# Patient Record
Sex: Female | Born: 2000 | Race: Black or African American | Hispanic: No | Marital: Single | Smoking: Never smoker
Health system: Southern US, Community
[De-identification: ages and names within clinical notes are randomized; demographics above are authoritative.]

## PROBLEM LIST (undated history)

## (undated) DIAGNOSIS — N809 Endometriosis, unspecified: Secondary | ICD-10-CM

## (undated) DIAGNOSIS — N289 Disorder of kidney and ureter, unspecified: Secondary | ICD-10-CM

## (undated) DIAGNOSIS — J45909 Unspecified asthma, uncomplicated: Secondary | ICD-10-CM

## (undated) DIAGNOSIS — F32A Depression, unspecified: Secondary | ICD-10-CM

## (undated) DIAGNOSIS — F419 Anxiety disorder, unspecified: Secondary | ICD-10-CM

## (undated) HISTORY — PX: MOUTH SURGERY: SHX715

## (undated) HISTORY — PX: ABDOMINAL SURGERY: SHX537

---
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
## 2018-04-15 ENCOUNTER — Encounter (HOSPITAL_COMMUNITY): Payer: Self-pay

## 2018-04-15 ENCOUNTER — Ambulatory Visit (HOSPITAL_COMMUNITY)
Admission: EM | Admit: 2018-04-15 | Discharge: 2018-04-15 | Disposition: A | Discharge status: Home / Self Care | Payer: Medicaid Other | Attending: Family Medicine | Admitting: Family Medicine

## 2018-04-15 DIAGNOSIS — Z8742 Personal history of other diseases of the female genital tract: Secondary | ICD-10-CM | POA: Diagnosis not present

## 2018-04-15 DIAGNOSIS — R590 Localized enlarged lymph nodes: Secondary | ICD-10-CM | POA: Diagnosis present

## 2018-04-15 DIAGNOSIS — Z886 Allergy status to analgesic agent status: Secondary | ICD-10-CM | POA: Diagnosis not present

## 2018-04-15 DIAGNOSIS — Z79899 Other long term (current) drug therapy: Secondary | ICD-10-CM | POA: Diagnosis not present

## 2018-04-15 DIAGNOSIS — J45909 Unspecified asthma, uncomplicated: Secondary | ICD-10-CM | POA: Insufficient documentation

## 2018-04-15 DIAGNOSIS — Z8249 Family history of ischemic heart disease and other diseases of the circulatory system: Secondary | ICD-10-CM | POA: Diagnosis not present

## 2018-04-15 DIAGNOSIS — Z7951 Long term (current) use of inhaled steroids: Secondary | ICD-10-CM | POA: Diagnosis not present

## 2018-04-15 DIAGNOSIS — R591 Generalized enlarged lymph nodes: Secondary | ICD-10-CM

## 2018-04-15 DIAGNOSIS — Z833 Family history of diabetes mellitus: Secondary | ICD-10-CM | POA: Insufficient documentation

## 2018-04-15 LAB — CBC WITH DIFFERENTIAL/PLATELET
ABS IMMATURE GRANULOCYTES: 0 10*3/uL (ref 0.0–0.1)
Basophils Absolute: 0 10*3/uL (ref 0.0–0.1)
Basophils Relative: 0 %
EOS PCT: 1 %
Eosinophils Absolute: 0 10*3/uL (ref 0.0–0.7)
HCT: 40.1 % (ref 36.0–46.0)
HEMOGLOBIN: 13 g/dL (ref 12.0–15.0)
Immature Granulocytes: 0 %
Lymphocytes Relative: 43 %
Lymphs Abs: 1.5 10*3/uL (ref 0.7–4.0)
MCH: 25.8 pg — AB (ref 26.0–34.0)
MCHC: 32.4 g/dL (ref 30.0–36.0)
MCV: 79.7 fL (ref 78.0–100.0)
MONO ABS: 0.4 10*3/uL (ref 0.1–1.0)
MONOS PCT: 12 %
NEUTROS ABS: 1.5 10*3/uL — AB (ref 1.7–7.7)
Neutrophils Relative %: 44 %
PLATELETS: 243 10*3/uL (ref 150–400)
RBC: 5.03 MIL/uL (ref 3.87–5.11)
RDW: 13 % (ref 11.5–15.5)
WBC: 3.5 10*3/uL — ABNORMAL LOW (ref 4.0–10.5)

## 2018-04-15 NOTE — Discharge Instructions (Signed)
Rest and drink fluids Blood work drawn.  We will follow up with you regarding abnormal results Please follow up with your PCP next week for further evaluation and management if symptoms persists Return or go to the ER if you have any new or worsening symptoms

## 2018-04-15 NOTE — ED Triage Notes (Signed)
Pt states that she has some swelling around her neck and is unsure of origin.

## 2018-04-15 NOTE — ED Provider Notes (Signed)
[facility]   [id number] 04/15/18 Arrival Time: 1103  SUBJECTIVE: History from: patient. Hannah Rileylexis Eltringham is a 17 y.o. female complains of improving swollen lymph node that began 3 weeks ago.  Denies a precipitating event or specific injury.  Denies positive sick exposure, but states she recently had a URI 2 weeks ago.  Denies cat scratch, consuming undercooked meat, tick bite, recent travel, recent blood transfusion, high-risk sexual behavior, or IV drug use.  Localizes her symptoms to the right back side of neck.  Describes the pain as intermittent and sharp and throbbing in character.  Has tried hydrocodone for the pain.  Denies alleviating and aggravating factors.  Denies similar symptoms in the past.  Denies fever, chills, rhinorrhea, ear pain, congestion, sore throat, SOB, CP, abdominal pain, erythema, ecchymosis, weakness, numbness and tingling.      ROS: As per HPI.  Past Medical History:  Diagnosis Date  . Asthma   . Endometriosis    Past Surgical History:  Procedure Laterality Date  . ABDOMINAL SURGERY    . MOUTH SURGERY     Allergies  Allergen Reactions  . Naproxen Itching and Other (See Comments)    Heaving menstrual bleeding   No current facility-administered medications on file prior to encounter.    Current Outpatient Medications on File Prior to Encounter  Medication Sig Dispense Refill  . albuterol (PROVENTIL HFA;VENTOLIN HFA) 108 (90 Base) MCG/ACT inhaler Inhale 2 puffs into the lungs every 4 (four) hours as needed for wheezing or shortness of breath. 1 Inhaler 2  . beclomethasone (QVAR) 40 MCG/ACT inhaler Inhale 1 puff into the lungs 2 (two) times daily.    . cetirizine (ZYRTEC) 10 MG tablet Take 10 mg by mouth daily.    . fluticasone (FLONASE) 50 MCG/ACT nasal spray Place 2 sprays into both nostrils daily as needed for rhinitis.    . medroxyPROGESTERone (DEPO-PROVERA) 150 MG/ML injection Inject 150 mg into the muscle every 3 (three) months.    .  montelukast (SINGULAIR) 10 MG tablet Take 10 mg by mouth at bedtime.    . naproxen (NAPROSYN) 500 MG tablet Take 1 tablet (500 mg total) by mouth 2 (two) times daily. 30 tablet 0  . nitrofurantoin, macrocrystal-monohydrate, (MACROBID) 100 MG capsule Take 1 capsule (100 mg total) by mouth 2 (two) times daily. (Patient not taking: Reported on 07/13/2016) 10 capsule 0  . Olopatadine HCl (PAZEO) 0.7 % SOLN Place 1 drop into both eyes daily.    . ondansetron (ZOFRAN ODT) 4 MG disintegrating tablet 4mg  ODT q4 hours prn nausea/vomit 4 tablet 0  . predniSONE (DELTASONE) 20 MG tablet 2 tabs po daily x 4 days 8 tablet 0  . sodium chloride (OCEAN) 0.65 % SOLN nasal spray Place 1 spray into both nostrils as needed for congestion.     Social History   Socioeconomic History  . Marital status: Single    Spouse name: Not on file  . Number of children: Not on file  . Years of education: Not on file  . Highest education level: Not on file  Occupational History  . Not on file  Social Needs  . Financial resource strain: Not on file  . Food insecurity:    Worry: Not on file    Inability: Not on file  . Transportation needs:    Medical: Not on file    Non-medical: Not on file  Tobacco Use  . Smoking status: Passive Smoke Exposure - Never Smoker  . Smokeless tobacco: Never Used  Substance and Sexual Activity  . Alcohol use: No  . Drug use: Not on file  . Sexual activity: Never  Lifestyle  . Physical activity:    Days per week: Not on file    Minutes per session: Not on file  . Stress: Not on file  Relationships  . Social connections:    Talks on phone: Not on file    Gets together: Not on file    Attends religious service: Not on file    Active member of club or organization: Not on file    Attends meetings of clubs or organizations: Not on file    Relationship status: Not on file  . Intimate partner violence:    Fear of current or ex partner: Not on file    Emotionally abused: Not on file     Physically abused: Not on file    Forced sexual activity: Not on file  Other Topics Concern  . Not on file  Social History Narrative  . Not on file   Family History  Problem Relation Age of Onset  . Heart disease Mother   . Diabetes Father     OBJECTIVE:  Vitals:   04/15/18 1153 04/15/18 1154  BP: 112/67   Resp:  19  Temp: 98.4 F (36.9 C)   TempSrc: Oral   SpO2:  100%    General appearance: AOx3; in no acute distress.  Head: NCAT Neck: Single moveable enlarged lymph node right side submandibular aspect <1cm without tenderness; second enlarged lymph node approximately 2 cm moveable right side posterior cervical chain with mild tenderness  Lungs: CTA bilaterally Heart: RRR.  Clear S1 and S2 without murmur, gallops, or rubs.  Radial pulses 2+ bilaterally. Skin: warm and dry Neurologic: Ambulates without difficulty; Sensation intact about the upper/ lower extremities Psychological: alert and cooperative; normal mood and affect  Results for orders placed or performed during the hospital encounter of 04/15/18 (from the past 24 hour(s))  CBC with Differential     Status: Abnormal   Collection Time: 04/15/18 12:48 PM  Result Value Ref Range   WBC 3.5 (L) 4.0 - 10.5 K/uL   RBC 5.03 3.87 - 5.11 MIL/uL   Hemoglobin 13.0 12.0 - 15.0 g/dL   HCT 16.1 09.6 - 04.5 %   MCV 79.7 78.0 - 100.0 fL   MCH 25.8 (L) 26.0 - 34.0 pg   MCHC 32.4 30.0 - 36.0 g/dL   RDW 40.9 81.1 - 91.4 %   Platelets 243 150 - 400 K/uL   Neutrophils Relative % 44 %   Neutro Abs 1.5 (L) 1.7 - 7.7 K/uL   Lymphocytes Relative 43 %   Lymphs Abs 1.5 0.7 - 4.0 K/uL   Monocytes Relative 12 %   Monocytes Absolute 0.4 0.1 - 1.0 K/uL   Eosinophils Relative 1 %   Eosinophils Absolute 0.0 0.0 - 0.7 K/uL   Basophils Relative 0 %   Basophils Absolute 0.0 0.0 - 0.1 K/uL   Immature Granulocytes 0 %   Abs Immature Granulocytes 0.0 0.0 - 0.1 K/uL    ASSESSMENT & PLAN:  1. Lymphadenopathy of head and neck    No  orders of the defined types were placed in this encounter.   Rest and drink fluids Blood work drawn.  We will follow up with you regarding abnormal results Please follow up with your PCP next week for further evaluation and management if symptoms persists Return or go to the ER if you have any new or  worsening symptoms  Discussed CBC results with Dr. Tracie Harrier and stated leukopenia most likely due to viral illness.  Patient advised to follow up with PCP if symptoms persists.    Reviewed expectations re: course of current medical issues. Questions answered. Outlined signs and symptoms indicating need for more acute intervention. Patient verbalized understanding. After Visit Summary given.    Rennis Harding, PA-C 04/15/18 2029

## 2018-05-14 ENCOUNTER — Encounter (HOSPITAL_COMMUNITY): Payer: Self-pay

## 2018-05-14 ENCOUNTER — Ambulatory Visit (HOSPITAL_COMMUNITY)
Admission: EM | Admit: 2018-05-14 | Discharge: 2018-05-14 | Disposition: A | Discharge status: Home / Self Care | Payer: Medicaid Other | Attending: Family Medicine | Admitting: Family Medicine

## 2018-05-14 DIAGNOSIS — M546 Pain in thoracic spine: Secondary | ICD-10-CM

## 2018-05-14 DIAGNOSIS — B360 Pityriasis versicolor: Secondary | ICD-10-CM

## 2018-05-14 DIAGNOSIS — G8929 Other chronic pain: Secondary | ICD-10-CM

## 2018-05-14 DIAGNOSIS — M545 Low back pain: Secondary | ICD-10-CM

## 2018-05-14 MED ORDER — BACLOFEN 5 MG PO TABS: 5.0000 mg | ORAL_TABLET | Freq: Three times a day (TID) | ORAL | 0 refills | Status: DC

## 2018-05-14 MED ORDER — KETOCONAZOLE 2 % EX CREA: 1.0000 "application " | TOPICAL_CREAM | Freq: Two times a day (BID) | CUTANEOUS | 0 refills | Status: DC

## 2018-05-14 MED ORDER — TRAMADOL HCL 50 MG PO TABS: 50.0000 mg | ORAL_TABLET | Freq: Four times a day (QID) | ORAL | 0 refills | Status: AC | PRN

## 2018-05-14 NOTE — ED Notes (Signed)
Pt reports being on 2 muscle relaxer's unknown name.
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
## 2018-05-14 NOTE — Discharge Instructions (Addendum)
It was nice seeing you. I am sorry about your back pain. Please see PCP soon for PT referral and also to obtain MRI of your spine. If symptoms worsens, please go to the ED. I also prescribed cream for your facial rash.

## 2018-05-17 ENCOUNTER — Other Ambulatory Visit: Payer: Self-pay

## 2018-05-17 ENCOUNTER — Encounter (HOSPITAL_COMMUNITY): Payer: Self-pay | Admitting: Emergency Medicine

## 2018-05-17 ENCOUNTER — Ambulatory Visit (HOSPITAL_COMMUNITY)
Admission: EM | Admit: 2018-05-17 | Discharge: 2018-05-17 | Disposition: A | Discharge status: Home / Self Care | Payer: Medicaid Other | Attending: Family Medicine | Admitting: Family Medicine

## 2018-05-17 DIAGNOSIS — M545 Low back pain, unspecified: Secondary | ICD-10-CM

## 2018-05-17 DIAGNOSIS — G8929 Other chronic pain: Secondary | ICD-10-CM

## 2018-05-17 MED ORDER — METHYLPREDNISOLONE 4 MG PO TBPK: ORAL_TABLET | ORAL | 0 refills | Status: DC

## 2018-05-17 NOTE — ED Triage Notes (Signed)
Patient was seen for back pain on Saturday, left work early today.  Pain in lower back, shoots into right arm and left leg.  Patient complains of a headache

## 2018-05-17 NOTE — Discharge Instructions (Signed)
Light and regular activity as tolerated.  Complete steroid dose pack.  Please follow up with your primary care provider for recheck and continued management of your symptoms.  If need further work restrictions please follow with occupational health or your PCP.
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
## 2018-05-25 ENCOUNTER — Encounter (HOSPITAL_COMMUNITY): Payer: Self-pay | Admitting: Emergency Medicine

## 2018-05-25 ENCOUNTER — Emergency Department (HOSPITAL_COMMUNITY): Payer: Medicaid Other

## 2018-05-25 ENCOUNTER — Other Ambulatory Visit: Payer: Self-pay

## 2018-05-25 ENCOUNTER — Emergency Department (HOSPITAL_COMMUNITY)
Admission: EM | Admit: 2018-05-25 | Discharge: 2018-05-25 | Disposition: A | Discharge status: Home / Self Care | Payer: Medicaid Other | Attending: Emergency Medicine | Admitting: Emergency Medicine

## 2018-05-25 DIAGNOSIS — J45909 Unspecified asthma, uncomplicated: Secondary | ICD-10-CM | POA: Diagnosis not present

## 2018-05-25 DIAGNOSIS — R079 Chest pain, unspecified: Secondary | ICD-10-CM | POA: Insufficient documentation

## 2018-05-25 DIAGNOSIS — Z79899 Other long term (current) drug therapy: Secondary | ICD-10-CM | POA: Insufficient documentation

## 2018-05-25 DIAGNOSIS — Z7722 Contact with and (suspected) exposure to environmental tobacco smoke (acute) (chronic): Secondary | ICD-10-CM | POA: Insufficient documentation

## 2018-05-25 LAB — I-STAT TROPONIN, ED
TROPONIN I, POC: 0 ng/mL (ref 0.00–0.08)
Troponin i, poc: 0 ng/mL (ref 0.00–0.08)

## 2018-05-25 LAB — BASIC METABOLIC PANEL
ANION GAP: 7 (ref 5–15)
BUN: 5 mg/dL — ABNORMAL LOW (ref 6–20)
CALCIUM: 8.8 mg/dL — AB (ref 8.9–10.3)
CO2: 24 mmol/L (ref 22–32)
CREATININE: 0.7 mg/dL (ref 0.44–1.00)
Chloride: 109 mmol/L (ref 98–111)
GFR calc Af Amer: >60 mL/min (ref >60)
GLUCOSE: 92 mg/dL (ref 70–99)
Potassium: 3.6 mmol/L (ref 3.5–5.1)
Sodium: 140 mmol/L (ref 135–145)

## 2018-05-25 LAB — CBC
HCT: 39 % (ref 36.0–46.0)
HEMOGLOBIN: 12.6 g/dL (ref 12.0–15.0)
MCH: 25.8 pg — ABNORMAL LOW (ref 26.0–34.0)
MCHC: 32.3 g/dL (ref 30.0–36.0)
MCV: 79.8 fL (ref 78.0–100.0)
PLATELETS: 262 10*3/uL (ref 150–400)
RBC: 4.89 MIL/uL (ref 3.87–5.11)
RDW: 12.7 % (ref 11.5–15.5)
WBC: 3.2 10*3/uL — ABNORMAL LOW (ref 4.0–10.5)

## 2018-05-25 LAB — I-STAT BETA HCG BLOOD, ED (MC, WL, AP ONLY): I-stat hCG, quantitative: <5 m[IU]/mL (ref <5)

## 2018-05-25 LAB — D-DIMER, QUANTITATIVE: D-Dimer, Quant: 0.57 ug/mL-FEU — ABNORMAL HIGH (ref 0.00–0.50)

## 2018-05-25 MED ORDER — FENTANYL CITRATE (PF) 100 MCG/2ML IJ SOLN
50.0000 ug | Freq: Once | INTRAMUSCULAR | Status: AC
  Administered 2018-05-25: 50 ug via INTRAVENOUS
  Filled 2018-05-25: qty 2

## 2018-05-25 MED ORDER — ALBUTEROL SULFATE (2.5 MG/3ML) 0.083% IN NEBU
2.5000 mg | INHALATION_SOLUTION | Freq: Once | RESPIRATORY_TRACT | Status: AC
  Administered 2018-05-25: 2.5 mg via RESPIRATORY_TRACT
  Filled 2018-05-25: qty 3

## 2018-05-25 MED ORDER — ALBUTEROL SULFATE HFA 108 (90 BASE) MCG/ACT IN AERS
1.0000 | INHALATION_SPRAY | Freq: Once | RESPIRATORY_TRACT | Status: AC
  Administered 2018-05-25: 1 via RESPIRATORY_TRACT
  Filled 2018-05-25: qty 6.7

## 2018-05-25 MED ORDER — ALBUTEROL SULFATE HFA 108 (90 BASE) MCG/ACT IN AERS: 2.0000 | INHALATION_SPRAY | RESPIRATORY_TRACT | 2 refills | Status: DC | PRN

## 2018-05-25 MED ORDER — BECLOMETHASONE DIPROPIONATE 40 MCG/ACT IN AERS: 1.0000 | INHALATION_SPRAY | Freq: Two times a day (BID) | RESPIRATORY_TRACT | 0 refills | Status: DC

## 2018-05-25 MED ORDER — METHYLPREDNISOLONE SODIUM SUCC 125 MG IJ SOLR
125.0000 mg | Freq: Once | INTRAMUSCULAR | Status: AC
  Administered 2018-05-25: 125 mg via INTRAVENOUS
  Filled 2018-05-25: qty 2

## 2018-05-25 MED ORDER — METHOCARBAMOL 500 MG PO TABS: 500.0000 mg | ORAL_TABLET | Freq: Two times a day (BID) | ORAL | 0 refills | Status: DC

## 2018-05-25 MED ORDER — IOPAMIDOL (ISOVUE-370) INJECTION 76%
INTRAVENOUS | Status: AC
  Administered 2018-05-25: 75 mL
  Filled 2018-05-25: qty 100
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
## 2018-05-25 NOTE — Progress Notes (Signed)
Patient ordered neb treatment, unable to give at this time. Patient not in room. Will check back.

## 2018-05-25 NOTE — Discharge Instructions (Addendum)
You can take Tylenol or Ibuprofen as directed for pain. You can alternate Tylenol and Ibuprofen every 4 hours. If you take Tylenol at 1pm, then you can take Ibuprofen at 5pm. Then you can take Tylenol again at 9pm.   Take Robaxin as prescribed. This medication will make you drowsy so do not drive or drink alcohol when taking it.  Follow-up with your primary care doctor as directed.  Follow-up with the Cardiologist office as directed.   Return to the Emergency Department immediately if you experiencing worsening chest pain, difficulty breathing, nausea/vomiting, get very sweaty, headache or any other worsening or concerning symptoms.

## 2018-06-28 ENCOUNTER — Encounter (HOSPITAL_COMMUNITY): Payer: Self-pay | Admitting: Emergency Medicine

## 2018-06-28 ENCOUNTER — Ambulatory Visit (INDEPENDENT_AMBULATORY_CARE_PROVIDER_SITE_OTHER): Payer: Medicaid Other

## 2018-06-28 ENCOUNTER — Ambulatory Visit (HOSPITAL_COMMUNITY)
Admission: EM | Admit: 2018-06-28 | Discharge: 2018-06-28 | Disposition: A | Discharge status: Home / Self Care | Payer: Medicaid Other | Attending: Family Medicine | Admitting: Family Medicine

## 2018-06-28 ENCOUNTER — Other Ambulatory Visit: Payer: Self-pay

## 2018-06-28 DIAGNOSIS — M25562 Pain in left knee: Secondary | ICD-10-CM | POA: Diagnosis not present

## 2018-06-28 DIAGNOSIS — L989 Disorder of the skin and subcutaneous tissue, unspecified: Secondary | ICD-10-CM

## 2018-06-28 DIAGNOSIS — M546 Pain in thoracic spine: Secondary | ICD-10-CM

## 2018-06-28 DIAGNOSIS — M4120 Other idiopathic scoliosis, site unspecified: Secondary | ICD-10-CM

## 2018-06-28 DIAGNOSIS — G8929 Other chronic pain: Secondary | ICD-10-CM | POA: Diagnosis not present

## 2018-06-28 DIAGNOSIS — N926 Irregular menstruation, unspecified: Secondary | ICD-10-CM

## 2018-06-28 LAB — POCT PREGNANCY, URINE: PREG TEST UR: NEGATIVE
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
## 2018-06-28 NOTE — Discharge Instructions (Signed)
It was nice seeing you today. For your irregular menses, pregnancy test is neg. This could be due to hormone mismatch. Please see PCP soon for reassessment.  For the bump on your belly, this could be due to insect bite. Use Tylenol as needed for pain. See us soon if it worsen.  For knee pain, likely due to sprain. Xray is neg. Use Ibuprofen or Tylenol as needed.  For your mid back pain, you have mild scoliosis on xray which could be the cause of your pain. Please start using back brace and see PCP soon for orthopedic referral.  Tylenol or ibuprofen as needed for pain.
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
## 2018-07-13 DIAGNOSIS — M4124 Other idiopathic scoliosis, thoracic region: Secondary | ICD-10-CM | POA: Insufficient documentation

## 2018-07-19 ENCOUNTER — Encounter: Payer: Self-pay | Admitting: *Deleted

## 2018-07-19 ENCOUNTER — Encounter: Payer: Self-pay | Admitting: Internal Medicine

## 2018-07-19 ENCOUNTER — Ambulatory Visit (INDEPENDENT_AMBULATORY_CARE_PROVIDER_SITE_OTHER): Payer: Medicaid Other | Admitting: Internal Medicine

## 2018-07-19 VITALS — BP 112/62 | HR 70 | Ht 64.0 in | Wt 124.2 lb

## 2018-07-19 DIAGNOSIS — I951 Orthostatic hypotension: Secondary | ICD-10-CM | POA: Diagnosis not present

## 2018-07-19 NOTE — Patient Instructions (Addendum)
Your physician recommends that you continue on your current medications as directed. Please refer to the Current Medication list given to you today.  Your physician recommends that you return for lab work and urine today (cortisol, tsh, urinalysis)   Push fluids and salt every day.

## 2018-07-19 NOTE — Progress Notes (Signed)
Cardiology Office Note   Date:  07/19/2018   ID:  Hannah Mccarthy, DOB 02/21/2001, MRN [id number]  PCP:  [facility]  Cardiologist:   Dietrich Pates, MD   Pt is referred by [facility] for cjest [aom   History of Present Illness: Hannah Mccarthy is a 17 y.o. female with a history of fatigue for years   Rare dizziness  No syncope    Has had some sharp chest pains, not associated with activity   R and L     She was seen in medicine clinic at [facility] on [facility]/10  Compalined of chronic back pain Some sharp componitens   The patient is not that active   Sleeps a lot    Knows she has to drink more fluids       Current Meds  Medication Sig  . albuterol (PROVENTIL HFA;VENTOLIN HFA) 108 (90 Base) MCG/ACT inhaler Inhale 2 puffs into the lungs every 4 (four) hours as needed for wheezing or shortness of breath.  . baclofen 5 MG TABS Take 5 mg by mouth 3 (three) times daily.  . beclomethasone (QVAR) 40 MCG/ACT inhaler Inhale 1 puff into the lungs 2 (two) times daily.  . cetirizine (ZYRTEC) 10 MG tablet Take 10 mg by mouth daily.  . fexofenadine (ALLEGRA) 180 MG tablet Take 180 mg by mouth daily.  Marland Kitchen ketoconazole (NIZORAL) 2 % cream Apply 1 application topically 2 (two) times daily.  . medroxyPROGESTERone (DEPO-PROVERA) 150 MG/ML injection Inject 150 mg into the muscle every 3 (three) months.  . meloxicam (MOBIC) 15 MG tablet Take 7.5-15 mg by mouth daily as needed for pain.  . methocarbamol (ROBAXIN) 500 MG tablet Take 1 tablet (500 mg total) by mouth 2 (two) times daily.  . methylPREDNISolone (MEDROL DOSEPAK) 4 MG TBPK tablet Per box instructions  . montelukast (SINGULAIR) 10 MG tablet Take 10 mg by mouth at bedtime.  Marland Kitchen QUEtiapine (SEROQUEL) 25 MG tablet Take 25 mg by mouth daily as needed for sedation.  . traMADol (ULTRAM) 50 MG tablet Take 50 mg by mouth every 6 (six) hours as needed for pain.  Marland Kitchen tretinoin (RETIN-A) 0.025 % cream Apply 1 application topically every evening.  .  triamcinolone (KENALOG) 0.1 % paste Use as directed 1 application in the mouth or throat as directed. Apply three to five times daily to affected area     Allergies:   Escitalopram and Naproxen   Past Medical History:  Diagnosis Date  . Asthma   . Endometriosis     Past Surgical History:  Procedure Laterality Date  . ABDOMINAL SURGERY    . MOUTH SURGERY       Social History:  The patient  reports that she is a non-smoker but has been exposed to tobacco smoke. She has never used smokeless tobacco. She reports that she does not drink alcohol.   Family History:  The patient's family history includes Diabetes in her father; Heart disease in her mother.    ROS:  Please see the history of present illness. All other systems are reviewed and  Negative to the above problem except as noted.    PHYSICAL EXAM: VS:  BP 112/62   Pulse 70   Ht 5\' 4"  (1.626 m)   Wt 124 lb 3.2 oz (56.3 kg)   BMI 21.32 kg/m    Orthostatic  LAYING:  BP 107/73  P 71   SITTING:  100/67  P 85   Standing 2 min 98/64  P 97  4 min 98/66  P 98 GEN: Thin 17 yo , in no acute distress  HEENT: normal  Neck: no JVD, carotid bruits, or masses Cardiac: RRR; no murmurs, rubs, or gallops,no edema  Respiratory:  clear to auscultation bilaterally, normal work of breathing GI: soft, nontender, nondistended, + BS  No hepatomegaly  MS: no deformity Moving all extremities   Skin: warm and dry, no rash Neuro:  Strength and sensation are intact Psych: euthymic mood, full affect   EKG:  EKG is ordered today.  SR 70 bpm      Lipid Panel No results found for: CHOL, TRIG, HDL, CHOLHDL, VLDL, LDLCALC, LDLDIRECT    Wt Readings from Last 3 Encounters:  07/19/18 124 lb 3.2 oz (56.3 kg) (43 %, Z= -0.18)*  05/25/18 125 lb (56.7 kg) (45 %, Z= -0.13)*  09/14/17 126 lb (57.2 kg) (50 %, Z= 0.00)*   * Growth percentiles are based on CDC (Girls, 2-20 Years) data.      ASSESSMENT AND PLAN: 1  Chest pain  I think the most  likely cause for CP is musculoskel for intercostal ischemia / strain    Pt's BP is marginal, pulse picks up and BP drops with standing   Can lead to lactic acidosis of interostal muscles Recomm fluids and salt intake   Will check coritisol, TSH,  UA  2   Fatigue   As noted above   Probably related to marginal BP       Current medicines are reviewed at length with the patient today.  The patient does not have concerns regarding medicines.  Signed, Dietrich PatesPaula Jp Eastham, MD  07/19/2018 2:31 PM    [facility] [address], [place], [place]  [address] Phone: [phone]; Fax: [phone]

## 2018-07-20 LAB — URINALYSIS
Bilirubin, UA: NEGATIVE
Glucose, UA: NEGATIVE
KETONES UA: NEGATIVE
LEUKOCYTES UA: NEGATIVE
NITRITE UA: NEGATIVE
Specific Gravity, UA: 1.022 (ref 1.005–1.030)
UUROB: 0.2 mg/dL (ref 0.2–1.0)
pH, UA: 5.5 (ref 5.0–7.5)

## 2018-07-20 LAB — TSH: TSH: 1.58 u[IU]/mL (ref 0.450–4.500)

## 2018-07-20 LAB — CORTISOL: Cortisol: 4.9 ug/dL

## 2018-07-26 ENCOUNTER — Telehealth: Payer: Self-pay | Admitting: *Deleted

## 2018-07-26 DIAGNOSIS — I951 Orthostatic hypotension: Secondary | ICD-10-CM

## 2018-07-26 NOTE — Telephone Encounter (Signed)
-----  Message from Fay Records, MD sent at 07/22/2018 11:48 PM EDT ----- Urine is concentrated   Also protein in it and RBC ? If early UTI WOuld recom she come in for repeat UA with microscopic eval  Set up for CBC, ANA and ESR as well

## 2018-07-26 NOTE — Telephone Encounter (Signed)
Spoke with patient. Reviewed labs. She will come for repeat UA (w microscopic eval) and labs tomorrow. Orders placed.

## 2018-07-27 ENCOUNTER — Encounter: Payer: Self-pay | Admitting: Internal Medicine

## 2018-07-27 ENCOUNTER — Other Ambulatory Visit: Payer: Medicaid Other

## 2018-07-27 DIAGNOSIS — I951 Orthostatic hypotension: Secondary | ICD-10-CM

## 2018-07-27 LAB — URINALYSIS, ROUTINE W REFLEX MICROSCOPIC
Bilirubin, UA: NEGATIVE
Glucose, UA: NEGATIVE
Ketones, UA: NEGATIVE
Nitrite, UA: NEGATIVE
Specific Gravity, UA: 1.017 (ref 1.005–1.030)
Urobilinogen, Ur: 0.2 mg/dL (ref 0.2–1.0)
pH, UA: 6 (ref 5.0–7.5)

## 2018-07-27 LAB — MICROSCOPIC EXAMINATION: Casts: NONE SEEN /lpf

## 2018-07-28 ENCOUNTER — Other Ambulatory Visit: Payer: Self-pay | Admitting: *Deleted

## 2018-07-28 LAB — ANA: Anti Nuclear Antibody(ANA): NEGATIVE

## 2018-07-28 LAB — CBC
HEMATOCRIT: 39.5 % (ref 34.0–46.6)
Hemoglobin: 12.7 g/dL (ref 11.1–15.9)
MCH: 25.3 pg — AB (ref 26.6–33.0)
MCHC: 32.2 g/dL (ref 31.5–35.7)
MCV: 79 fL (ref 79–97)
Platelets: 321 10*3/uL (ref 150–450)
RBC: 5.01 x10E6/uL (ref 3.77–5.28)
RDW: 13.7 % (ref 12.3–15.4)
WBC: 3.3 10*3/uL — ABNORMAL LOW (ref 3.4–10.8)

## 2018-07-28 LAB — SEDIMENTATION RATE: Sed Rate: 15 mm/hr (ref 0–32)

## 2018-07-29 ENCOUNTER — Other Ambulatory Visit: Payer: Self-pay | Admitting: *Deleted

## 2018-07-29 DIAGNOSIS — I951 Orthostatic hypotension: Secondary | ICD-10-CM

## 2018-08-03 LAB — PROTEIN, URINE, 24 HOUR
PROTEIN UR: 41.3 mg/dL
Protein, 24H Urine: 2065 mg/24 hr — ABNORMAL HIGH (ref 30–150)

## 2018-08-04 ENCOUNTER — Telehealth: Payer: Self-pay | Admitting: *Deleted

## 2018-08-04 DIAGNOSIS — R809 Proteinuria, unspecified: Secondary | ICD-10-CM | POA: Insufficient documentation

## 2018-08-04 NOTE — Telephone Encounter (Signed)
Pt referral placed in Epic for [facility]. Pt is aware. PICC's  made aware of urgent  referral.

## 2018-08-04 NOTE — Telephone Encounter (Signed)
-----   Message from Pricilla Riffle, MD sent at 08/03/2018 10:23 PM EDT ----- Significant protein in urine Needs to be referred to renal clinic   Urgent appt

## 2018-08-09 ENCOUNTER — Telehealth: Payer: Self-pay | Admitting: Internal Medicine

## 2018-08-09 DIAGNOSIS — R809 Proteinuria, unspecified: Secondary | ICD-10-CM

## 2018-08-09 NOTE — Telephone Encounter (Addendum)
Spoke with referral dept at [facility]. They have received referral.  One of their doctors reviewed it and rated it a 4.  The ratings are from 1-4, one is urgent, four is not.    I was told the patient should expect a call in the next month.  I placed a second referral and requested urgent appointment. Will follow.  I have replied to patient's MyChart message with an update.

## 2018-08-09 NOTE — Addendum Note (Signed)
Addended by: Lendon Ka on: 08/09/2018 10:47 AM   Modules accepted: Orders

## 2018-08-09 NOTE — Telephone Encounter (Signed)
Pt wrote in wondering when she will see kidney doctor Has referral been put in?  Can someone call her

## 2018-08-19 ENCOUNTER — Telehealth: Payer: Self-pay | Admitting: Internal Medicine

## 2018-08-19 NOTE — Telephone Encounter (Signed)
Patient has emailed in re appt in renal This is not an emergent consult  Protein in urine did not happen acutely     Their timeframe for appt is acceptable

## 2018-08-19 NOTE — Telephone Encounter (Signed)
Called patient to inform, got VM.  Sent MyChart message reply with the information below from Dr. Tenny Craw.

## 2018-10-12 ENCOUNTER — Other Ambulatory Visit: Payer: Self-pay | Admitting: Nephrology

## 2018-10-12 DIAGNOSIS — R319 Hematuria, unspecified: Secondary | ICD-10-CM

## 2018-10-12 DIAGNOSIS — N182 Chronic kidney disease, stage 2 (mild): Secondary | ICD-10-CM

## 2018-10-12 DIAGNOSIS — R809 Proteinuria, unspecified: Secondary | ICD-10-CM

## 2018-10-18 ENCOUNTER — Other Ambulatory Visit: Payer: Medicaid Other

## 2018-10-19 ENCOUNTER — Ambulatory Visit
Admission: RE | Admit: 2018-10-19 | Discharge: 2018-10-19 | Disposition: A | Discharge status: Home / Self Care | Payer: Medicaid Other | Source: Ambulatory Visit | Attending: Nephrology | Admitting: Nephrology

## 2018-10-19 DIAGNOSIS — R319 Hematuria, unspecified: Secondary | ICD-10-CM

## 2018-10-19 DIAGNOSIS — N182 Chronic kidney disease, stage 2 (mild): Secondary | ICD-10-CM

## 2018-10-19 DIAGNOSIS — R809 Proteinuria, unspecified: Secondary | ICD-10-CM

## 2018-11-16 ENCOUNTER — Emergency Department (HOSPITAL_COMMUNITY)
Admission: EM | Admit: 2018-11-16 | Discharge: 2018-11-16 | Disposition: A | Discharge status: Home / Self Care | Payer: Medicaid Other | Attending: Emergency Medicine | Admitting: Emergency Medicine

## 2018-11-16 ENCOUNTER — Ambulatory Visit (HOSPITAL_COMMUNITY)
Admission: EM | Admit: 2018-11-16 | Discharge: 2018-11-16 | Disposition: A | Discharge status: Other Acute Inpatient Hospital | Payer: Medicaid Other

## 2018-11-16 ENCOUNTER — Emergency Department (HOSPITAL_COMMUNITY): Payer: Medicaid Other

## 2018-11-16 ENCOUNTER — Encounter (HOSPITAL_COMMUNITY): Payer: Self-pay | Admitting: Emergency Medicine

## 2018-11-16 DIAGNOSIS — R109 Unspecified abdominal pain: Secondary | ICD-10-CM | POA: Insufficient documentation

## 2018-11-16 DIAGNOSIS — R079 Chest pain, unspecified: Secondary | ICD-10-CM | POA: Insufficient documentation

## 2018-11-16 DIAGNOSIS — M545 Low back pain: Secondary | ICD-10-CM | POA: Insufficient documentation

## 2018-11-16 DIAGNOSIS — Z5321 Procedure and treatment not carried out due to patient leaving prior to being seen by health care provider: Secondary | ICD-10-CM | POA: Diagnosis not present

## 2018-11-16 LAB — BASIC METABOLIC PANEL
Anion gap: 5 (ref 5–15)
BUN: 13 mg/dL (ref 6–20)
CO2: 26 mmol/L (ref 22–32)
Calcium: 8.8 mg/dL — ABNORMAL LOW (ref 8.9–10.3)
Chloride: 108 mmol/L (ref 98–111)
Creatinine, Ser: 0.72 mg/dL (ref 0.44–1.00)
GFR calc non Af Amer: >60 mL/min (ref >60)
Glucose, Bld: 117 mg/dL — ABNORMAL HIGH (ref 70–99)
Potassium: 3.7 mmol/L (ref 3.5–5.1)
SODIUM: 139 mmol/L (ref 135–145)

## 2018-11-16 LAB — I-STAT BETA HCG BLOOD, ED (MC, WL, AP ONLY)

## 2018-11-16 LAB — CBC
HEMATOCRIT: 38.8 % (ref 36.0–46.0)
Hemoglobin: 12.4 g/dL (ref 12.0–15.0)
MCH: 26.2 pg (ref 26.0–34.0)
MCHC: 32 g/dL (ref 30.0–36.0)
MCV: 82 fL (ref 80.0–100.0)
NRBC: 0 % (ref 0.0–0.2)
Platelets: 283 10*3/uL (ref 150–400)
RBC: 4.73 MIL/uL (ref 3.87–5.11)
RDW: 12.8 % (ref 11.5–15.5)
WBC: 4.7 10*3/uL (ref 4.0–10.5)

## 2018-11-16 LAB — I-STAT TROPONIN, ED: Troponin i, poc: 0 ng/mL (ref 0.00–0.08)

## 2018-11-16 NOTE — ED Triage Notes (Signed)
Pt c/o chest pains that are central for over 2 weeks and thought was related to her asthma but not relieved by neb treatments. Pt also c/o left lower abd pain and mid back pains. Denies vomiting or urinary complaints.

## 2018-11-19 ENCOUNTER — Ambulatory Visit (HOSPITAL_COMMUNITY)
Admission: EM | Admit: 2018-11-19 | Discharge: 2018-11-19 | Disposition: A | Discharge status: Home / Self Care | Payer: Medicaid Other | Attending: Internal Medicine | Admitting: Internal Medicine

## 2018-11-19 ENCOUNTER — Encounter (HOSPITAL_COMMUNITY): Payer: Self-pay | Admitting: Emergency Medicine

## 2018-11-19 DIAGNOSIS — J209 Acute bronchitis, unspecified: Secondary | ICD-10-CM | POA: Diagnosis not present

## 2018-11-19 DIAGNOSIS — R05 Cough: Secondary | ICD-10-CM | POA: Insufficient documentation

## 2018-11-19 DIAGNOSIS — M940 Chondrocostal junction syndrome [Tietze]: Secondary | ICD-10-CM | POA: Insufficient documentation

## 2018-11-19 DIAGNOSIS — R059 Cough, unspecified: Secondary | ICD-10-CM

## 2018-11-19 MED ORDER — ACETAMINOPHEN 500 MG PO TABS: 500.0000 mg | ORAL_TABLET | Freq: Four times a day (QID) | ORAL | 0 refills | Status: DC | PRN

## 2018-11-19 MED ORDER — BENZONATATE 100 MG PO CAPS: 100.0000 mg | ORAL_CAPSULE | Freq: Three times a day (TID) | ORAL | 0 refills | Status: DC

## 2018-11-19 MED ORDER — IPRATROPIUM-ALBUTEROL 0.5-2.5 (3) MG/3ML IN SOLN
RESPIRATORY_TRACT | Status: AC
  Filled 2018-11-19: qty 3

## 2018-11-19 MED ORDER — PREDNISONE 20 MG PO TABS: 20.0000 mg | ORAL_TABLET | Freq: Two times a day (BID) | ORAL | 0 refills | Status: AC

## 2018-11-19 MED ORDER — IPRATROPIUM-ALBUTEROL 0.5-2.5 (3) MG/3ML IN SOLN
3.0000 mL | Freq: Once | RESPIRATORY_TRACT | Status: AC
  Administered 2018-11-19: 3 mL via RESPIRATORY_TRACT

## 2018-11-19 NOTE — ED Triage Notes (Signed)
Pt c/o nausea, cp, body aches, vomiting, flu like symptoms x2 weeks.

## 2018-11-19 NOTE — ED Provider Notes (Signed)
[facility]   [id number] 11/19/18 Arrival Time: 1657   CC: Cough  SUBJECTIVE: History from: patient and family.  Hannah Mccarthy is a 18 y.o. female hx significant for asthma, who presents with abrupt onset of worsening persistent dry cough, wheezing, SOB, and chest discomfort with deep breath and cough x 2 weeks.  Denies known sick exposure or precipitating event.  Has tried OTC medications, nebulizer, and inhaler with temporary relief.  Symptoms are made worse with deep breath and movement.  Complains of associated body aches, and 5 episodes of vomiting over the past 2 weeks.  Denies fever, chills, fatigue, sinus pain, rhinorrhea, chest pain, nausea, changes in bowel or bladder habits.    Was seen at [facility] on 11/16/2018 for the same symptoms and had a unremarkable EKG, chest x-ray, troponin, CBC, and serum pregnancy.    Received flu shot this year: no.  ROS: As per HPI.  Past Medical History:  Diagnosis Date  . Asthma   . Endometriosis    Past Surgical History:  Procedure Laterality Date  . ABDOMINAL SURGERY    . MOUTH SURGERY     Allergies  Allergen Reactions  . Escitalopram Itching and Nausea Only  . Naproxen Itching, Other (See Comments) and Anxiety    Heaving menstrual bleeding   No current facility-administered medications on file prior to encounter.    Current Outpatient Medications on File Prior to Encounter  Medication Sig Dispense Refill  . albuterol (PROVENTIL HFA;VENTOLIN HFA) 108 (90 Base) MCG/ACT inhaler Inhale 2 puffs into the lungs every 4 (four) hours as needed for wheezing or shortness of breath. 1 Inhaler 2  . beclomethasone (QVAR) 40 MCG/ACT inhaler Inhale 1 puff into the lungs 2 (two) times daily. 1 Inhaler 0  . budesonide-formoterol (SYMBICORT) 160-4.5 MCG/ACT inhaler Inhale 2 puffs into the lungs 2 (two) times daily.    . DULoxetine (CYMBALTA) 30 MG capsule Take 30 mg by mouth daily.    Marland Kitchen FLUoxetine (PROZAC) 20 MG tablet Take 20 mg by mouth  daily.    . nabumetone (RELAFEN) 500 MG tablet Take 500 mg by mouth daily.    . norethindrone (AYGESTIN) 5 MG tablet Take by mouth daily.    . pantoprazole (PROTONIX) 40 MG tablet Take 40 mg by mouth daily.    . QUEtiapine (SEROQUEL) 25 MG tablet Take 25 mg by mouth daily as needed for sedation.    . baclofen 5 MG TABS Take 5 mg by mouth 3 (three) times daily. 20 tablet 0  . cetirizine (ZYRTEC) 10 MG tablet Take 10 mg by mouth daily.    . fexofenadine (ALLEGRA) 180 MG tablet Take 180 mg by mouth daily.    Marland Kitchen ketoconazole (NIZORAL) 2 % cream Apply 1 application topically 2 (two) times daily. 15 g 0  . medroxyPROGESTERone (DEPO-PROVERA) 150 MG/ML injection Inject 150 mg into the muscle every 3 (three) months.    . methylPREDNISolone (MEDROL DOSEPAK) 4 MG TBPK tablet Per box instructions 21 tablet 0  . montelukast (SINGULAIR) 10 MG tablet Take 10 mg by mouth at bedtime.    . tretinoin (RETIN-A) 0.025 % cream Apply 1 application topically every evening.    . triamcinolone (KENALOG) 0.1 % paste Use as directed 1 application in the mouth or throat as directed. Apply three to five times daily to affected area  0   Social History   Socioeconomic History  . Marital status: Single    Spouse name: Not on file  . Number of children: Not  on file  . Years of education: Not on file  . Highest education level: Not on file  Occupational History  . Not on file  Social Needs  . Financial resource strain: Not on file  . Food insecurity:    Worry: Not on file    Inability: Not on file  . Transportation needs:    Medical: Not on file    Non-medical: Not on file  Tobacco Use  . Smoking status: Passive Smoke Exposure - Never Smoker  . Smokeless tobacco: Never Used  Substance and Sexual Activity  . Alcohol use: No  . Drug use: Not on file  . Sexual activity: Never  Lifestyle  . Physical activity:    Days per week: Not on file    Minutes per session: Not on file  . Stress: Not on file    Relationships  . Social connections:    Talks on phone: Not on file    Gets together: Not on file    Attends religious service: Not on file    Active member of club or organization: Not on file    Attends meetings of clubs or organizations: Not on file    Relationship status: Not on file  . Intimate partner violence:    Fear of current or ex partner: Not on file    Emotionally abused: Not on file    Physically abused: Not on file    Forced sexual activity: Not on file  Other Topics Concern  . Not on file  Social History Narrative  . Not on file   Family History  Problem Relation Age of Onset  . Heart disease Mother   . Diabetes Father     OBJECTIVE:  Vitals:   11/19/18 1734  BP: 117/74  Pulse: 69  Resp: 18  Temp: 98.4 F (36.9 C)  SpO2: 100%     General appearance: alert; well-appearing with stable VS, nontoxic; speaking in full sentences and tolerating own secretions HEENT: NCAT; Ears: EACs clear, TMs pearly gray; Eyes: PERRL.  EOM grossly intact. Nose: nares patent without rhinorrhea, Throat: oropharynx clear, tonsils non erythematous or enlarged, uvula midline  Neck: supple without LAD Lungs: unlabored respirations, symmetrical air entry; cough: absent; no respiratory distress; CTAB Heart: regular rate and rhythm.  Radial pulses 2+ symmetrical bilaterally Chest wall: tenderness with AP and lateral compression of chest; able to reproduce chest discomfort on exam Skin: warm and dry Psychological: alert and cooperative; normal mood and affect  MDM:  Hx of persistent cough, sob, and chest discomfort with cough and deep breath x 2 weeks.  Well-appearing.  Stable VS - not tachycardic or hypoxic.  Unremarkable exam. Able to reproduce chest discomfort on exam.   Negative work-up in ED on 11/16/2018 including cxr, ekg, troponins, CBC, serum preg.  Denies significant improvement with duo-neb.  Will treat for bronchitis and costochondritis.  Prednisone prescribed.  Tessalon  for cough.  Given strict return and ED precautions.    ASSESSMENT & PLAN:  1. Acute bronchitis, unspecified organism   2. Cough   3. Costochondritis      Meds ordered this encounter  Medications  . ipratropium-albuterol (DUONEB) 0.5-2.5 (3) MG/3ML nebulizer solution 3 mL  . predniSONE (DELTASONE) 20 MG tablet    Sig: Take 1 tablet (20 mg total) by mouth 2 (two) times daily with a meal for 5 days.    Dispense:  10 tablet    Refill:  0    Order Specific Question:   Supervising  Provider    Answer:   Eustace Moore [[id number]]  . benzonatate (TESSALON) 100 MG capsule    Sig: Take 1 capsule (100 mg total) by mouth every 8 (eight) hours.    Dispense:  21 capsule    Refill:  0    Order Specific Question:   Supervising Provider    Answer:   Eustace Moore [[id number]]  . acetaminophen (TYLENOL) 500 MG tablet    Sig: Take 1 tablet (500 mg total) by mouth every 6 (six) hours as needed.    Dispense:  30 tablet    Refill:  0    Order Specific Question:   Supervising Provider    Answer:   Eustace Moore [[id number]]   Duo-neb given in office with relief Get plenty of rest and push fluids Prednisone prescribed for potential bronchitis.  Take as directed and to completion Tessalon perles as needed for cough Unable to have tylenol or ibuprofen per nephrologist due to stage 2 kidney disease Use inhaler and nebulizer as needed for shortness of breath and/or wheezing Follow up with PCP or community Health if symptoms persist Return or go to ER if you have any new or worsening symptoms fever, chills, nausea, vomiting, chest pain, cough, shortness of breath, wheezing, abdominal pain, changes in bowel or bladder habits, etc...  Reviewed expectations re: course of current medical issues. Questions answered. Outlined signs and symptoms indicating need for more acute intervention. Patient verbalized understanding. After Visit Summary given.         Rennis Harding, PA-C 11/19/18  1901

## 2018-11-19 NOTE — Discharge Instructions (Addendum)
Duo-neb given in office with relief Get plenty of rest and push fluids Prednisone prescribed for potential bronchitis.  Take as directed and to completion Tessalon perles as needed for cough Tylenol prescribed.  Use as needed for fever, chills, body aches and pain.   Use inhaler and nebulizer as needed for shortness of breath and/or wheezing Follow up with PCP or community Health if symptoms persist Return or go to ER if you have any new or worsening symptoms fever, chills, nausea, vomiting, chest pain, cough, shortness of breath, wheezing, abdominal pain, changes in bowel or bladder habits, etc..Marland Kitchen

## 2019-01-11 IMAGING — CR DG CHEST 2V
2 series · 2 of 2 positions shown · non-contrast
Comparison: 12/16/2015.

CLINICAL DATA: Flu like symptoms with chills, nausea, vomiting,
headaches and generalized aches for 3 days.

EXAM:
CHEST  2 VIEW

[w chest pa]
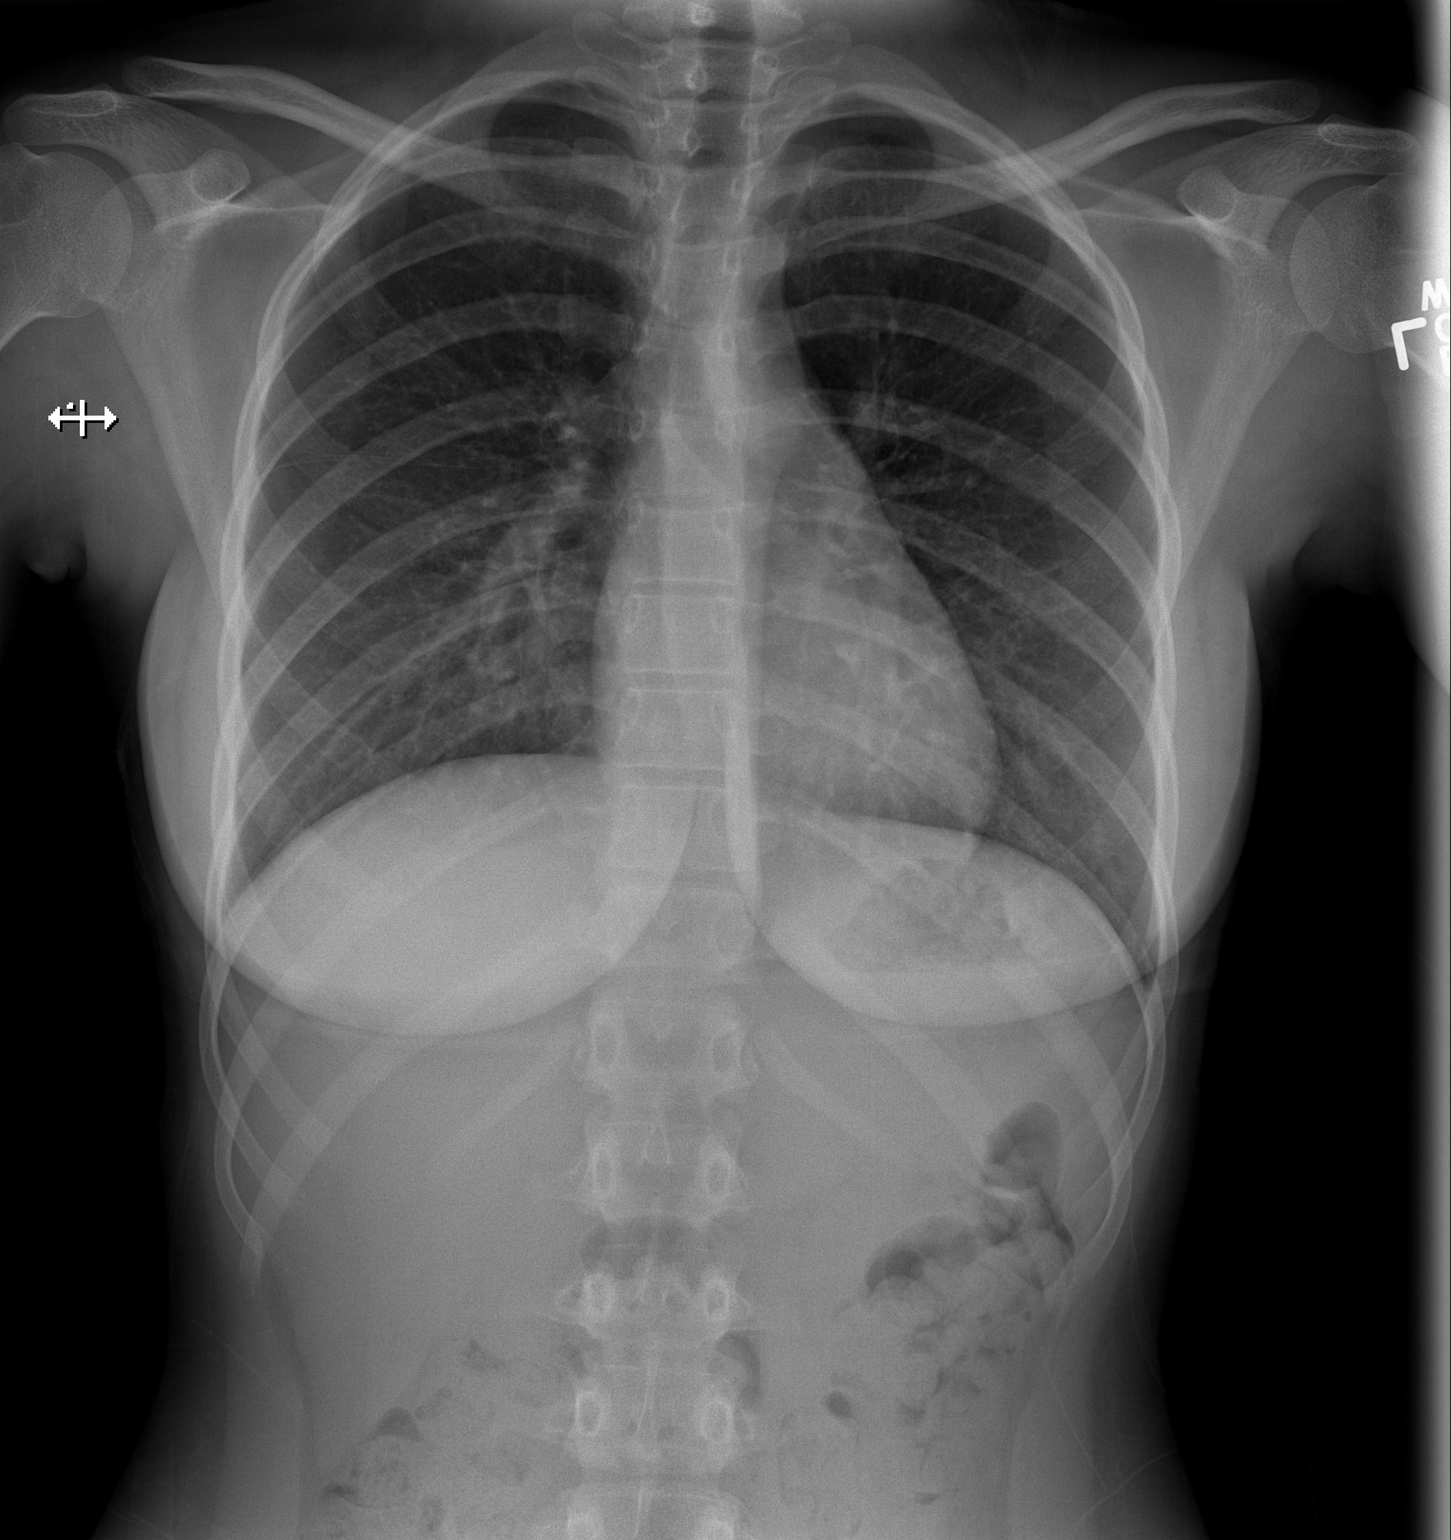

[w chest lat]
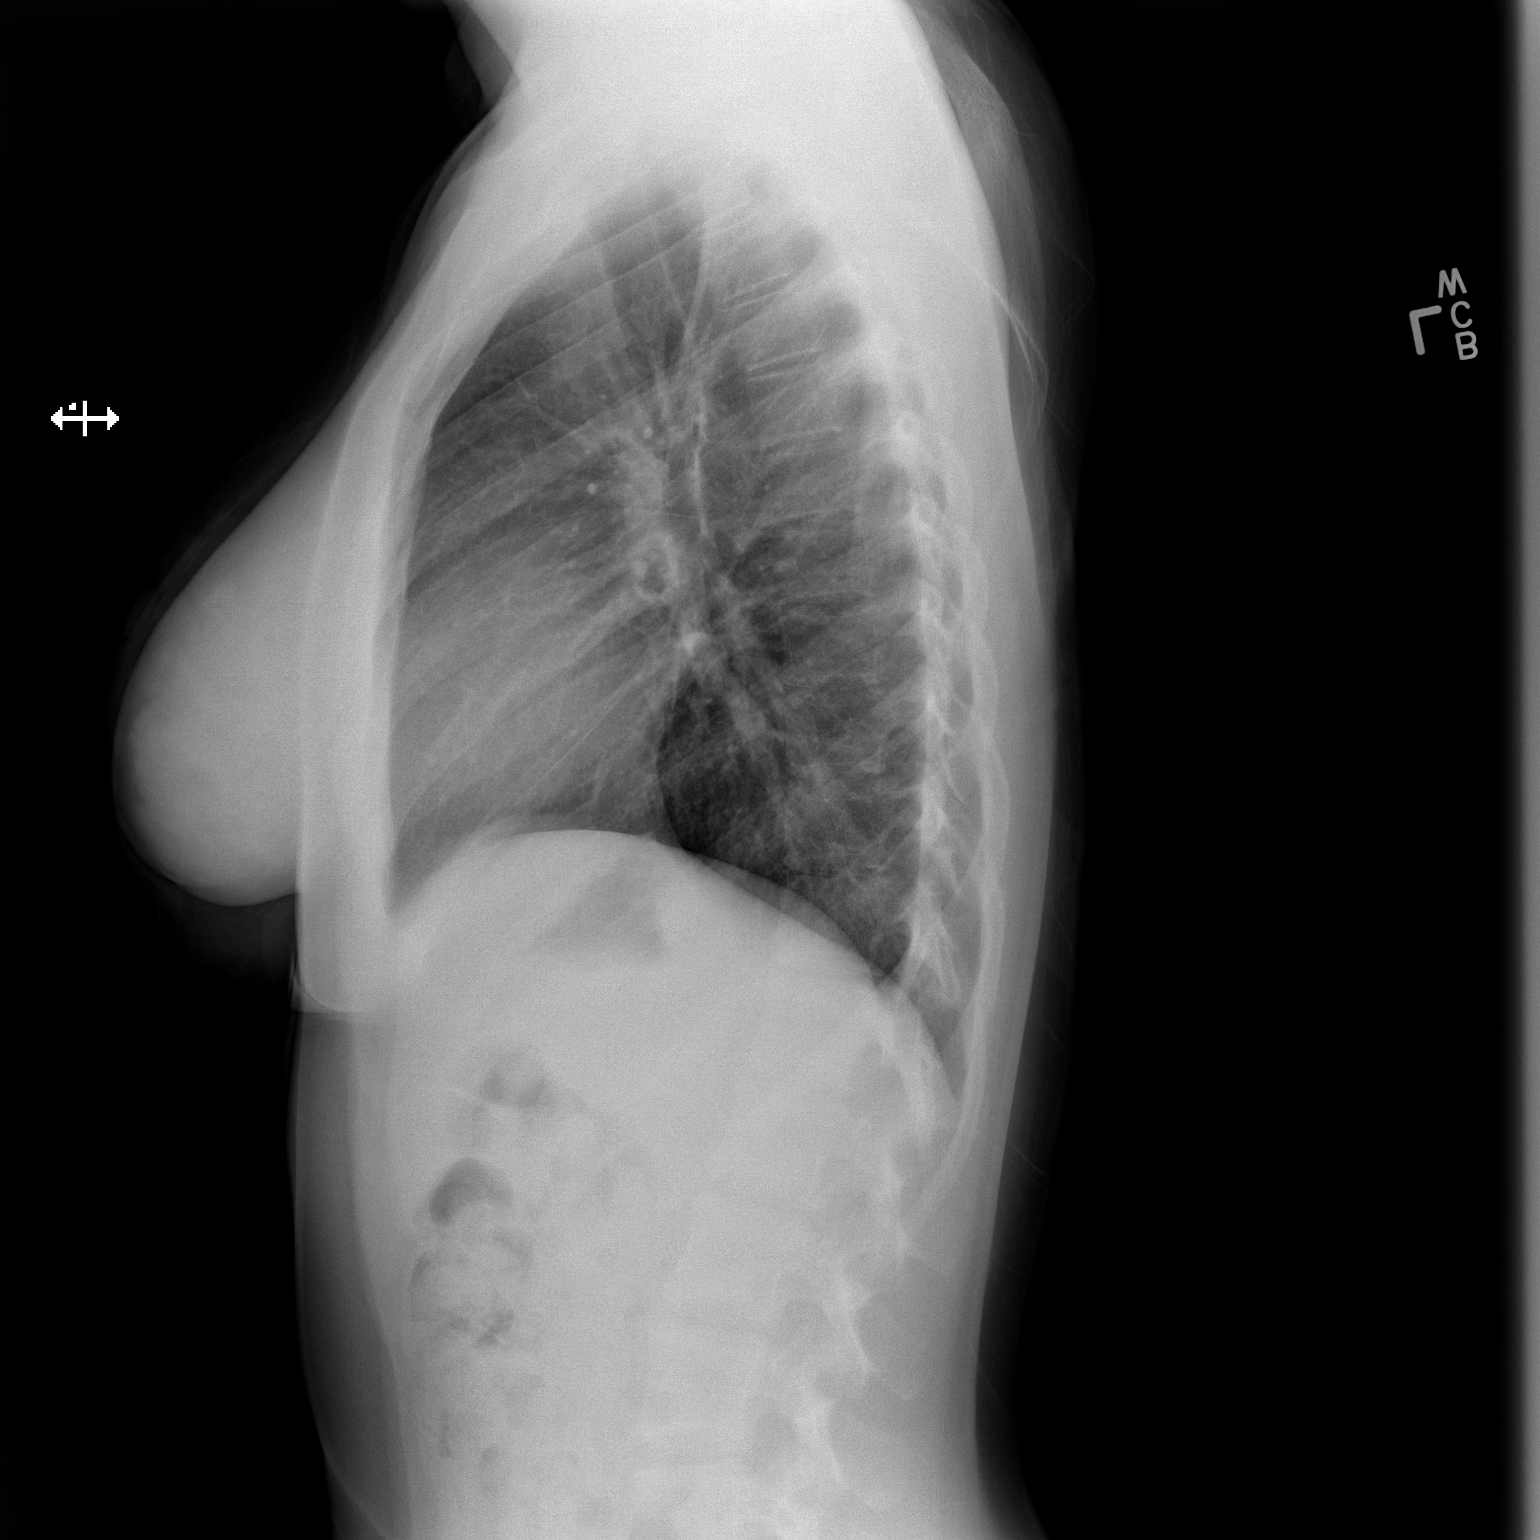

[2 of 2 positions shown; findings below may reference images not displayed]

FINDINGS: The heart size and mediastinal contours are normal. The lungs are
clear. There is no pleural effusion or pneumothorax. No acute
osseous findings are identified.
IMPRESSION: Stable chest.  No active cardiopulmonary process.

## 2019-06-07 DIAGNOSIS — Z833 Family history of diabetes mellitus: Secondary | ICD-10-CM | POA: Insufficient documentation

## 2019-06-07 DIAGNOSIS — F332 Major depressive disorder, recurrent severe without psychotic features: Secondary | ICD-10-CM | POA: Insufficient documentation

## 2019-07-29 ENCOUNTER — Encounter (HOSPITAL_COMMUNITY): Payer: Self-pay | Admitting: Emergency Medicine

## 2019-07-29 ENCOUNTER — Ambulatory Visit (INDEPENDENT_AMBULATORY_CARE_PROVIDER_SITE_OTHER): Payer: Medicaid Other

## 2019-07-29 ENCOUNTER — Other Ambulatory Visit: Payer: Self-pay

## 2019-07-29 ENCOUNTER — Ambulatory Visit (HOSPITAL_COMMUNITY)
Admission: EM | Admit: 2019-07-29 | Discharge: 2019-07-29 | Disposition: A | Discharge status: Home / Self Care | Payer: Medicaid Other | Attending: Family Medicine | Admitting: Family Medicine

## 2019-07-29 DIAGNOSIS — Z3202 Encounter for pregnancy test, result negative: Secondary | ICD-10-CM | POA: Diagnosis not present

## 2019-07-29 DIAGNOSIS — M7918 Myalgia, other site: Secondary | ICD-10-CM

## 2019-07-29 LAB — POCT PREGNANCY, URINE: Preg Test, Ur: NEGATIVE

## 2019-07-29 MED ORDER — CYCLOBENZAPRINE HCL 10 MG PO TABS: 5.0000 mg | ORAL_TABLET | Freq: Every day | ORAL | 0 refills | Status: DC

## 2019-07-29 MED ORDER — IBUPROFEN 600 MG PO TABS: 600.0000 mg | ORAL_TABLET | Freq: Four times a day (QID) | ORAL | 0 refills | Status: DC | PRN

## 2019-07-29 MED ORDER — ACETAMINOPHEN 500 MG PO TABS: 500.0000 mg | ORAL_TABLET | Freq: Four times a day (QID) | ORAL | 0 refills | Status: DC | PRN

## 2019-07-29 NOTE — ED Triage Notes (Signed)
PT reports soreness under bilateral breasts across ribs. PT reports soreness is affecting the way she moves. PT just started going to the gym, but this was present beforehand. PT saw PCP for same 1 month ago and was told it was due to allergies / asthma. Using her inhaler does not help. Area is tender to palpation.

## 2019-07-29 NOTE — Discharge Instructions (Signed)
Your x-ray was normal without broken ribs.  I believe this is all muscle.  Make sure you are resting and doing no exercise until you start feeling better.  Stretching may help.  You can take ibuprofen as needed for pain inflammation I will give you a low-dose muscle relaxer this may also help.
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
## 2019-07-29 NOTE — ED Notes (Signed)
PT reports she has seen nephrology and has a history of kidney disease and they have suggested operation

## 2019-07-29 NOTE — ED Notes (Signed)
PT saw gastro for same 1 week ago and had CT

## 2019-08-11 ENCOUNTER — Encounter (HOSPITAL_COMMUNITY): Payer: Self-pay | Admitting: Emergency Medicine

## 2019-08-11 ENCOUNTER — Other Ambulatory Visit: Payer: Self-pay

## 2019-08-11 ENCOUNTER — Ambulatory Visit (HOSPITAL_COMMUNITY)
Admission: EM | Admit: 2019-08-11 | Discharge: 2019-08-11 | Disposition: A | Discharge status: Home / Self Care | Payer: Medicaid Other | Attending: Family Medicine | Admitting: Family Medicine

## 2019-08-11 DIAGNOSIS — Z3202 Encounter for pregnancy test, result negative: Secondary | ICD-10-CM

## 2019-08-11 DIAGNOSIS — R110 Nausea: Secondary | ICD-10-CM | POA: Diagnosis not present

## 2019-08-11 DIAGNOSIS — R1013 Epigastric pain: Secondary | ICD-10-CM

## 2019-08-11 LAB — POCT URINALYSIS DIP (DEVICE)
Bilirubin Urine: NEGATIVE
Glucose, UA: NEGATIVE mg/dL
Hgb urine dipstick: NEGATIVE
Ketones, ur: NEGATIVE mg/dL
Nitrite: NEGATIVE
Protein, ur: NEGATIVE mg/dL
Specific Gravity, Urine: >=1.03 (ref 1.005–1.030)
Urobilinogen, UA: 0.2 mg/dL (ref 0.0–1.0)
pH: 6 (ref 5.0–8.0)

## 2019-08-11 LAB — POCT PREGNANCY, URINE: Preg Test, Ur: NEGATIVE

## 2019-08-11 MED ORDER — METHOCARBAMOL 500 MG PO TABS: 500.0000 mg | ORAL_TABLET | Freq: Two times a day (BID) | ORAL | 0 refills | Status: DC

## 2019-08-11 MED ORDER — CIPROFLOXACIN HCL 500 MG PO TABS: 500.0000 mg | ORAL_TABLET | Freq: Two times a day (BID) | ORAL | 0 refills | Status: DC

## 2019-08-11 MED ORDER — PREDNISONE 20 MG PO TABS: 20.0000 mg | ORAL_TABLET | Freq: Every day | ORAL | 0 refills | Status: AC

## 2019-08-11 NOTE — ED Provider Notes (Signed)
[facility]    CSN: [id number] Arrival date & time: 08/11/19  1142      History   Chief Complaint Chief Complaint  Patient presents with   Appointment    1210   Abdominal Pain   rib pain    HPI Polo Rileylexis Veltre is a 18 y.o. female.   Subjective:   Polo Rileylexis Schiltz is a 18 y.o. female who presents for evaluation of abdominal pain. Onset was 1 month ago. Symptoms have been unchanged. The pain is described as aching, and is 7/10 in intensity. Pain is located in the across the epigastric region without radiation.  Aggravating factors: activity and palpation.  Alleviating factors: none. Associated symptoms: nausea. The patient denies anorexia, arthralagias, belching, chills, constipation, diarrhea, dysuria, fever, frequency, hematuria, melena and vomiting. Notably, the patient was evaluated here on 9/25 for the same. CXR was negative at that time and she was diagnosed with musculoskeletal pain. She was prescribed  muscle relaxant and ibuprofen. However, patient states that she has kidney disease and unable to take NSAIDs. She also reports that the muscle relaxant wasn't helpful. She hasn't tried anything else for her symptoms. Patient recently underwent an EGD with GI a few weeks ago and reports that it was unremarkable. She was scheduled for a CT scan but missed the appointment and therefore didn't have it done.   The patient's history has been marked as reviewed and updated as appropriate. allergies, current medications, past family history, past medical history, past social history, past surgical history and problem list       Past Medical History:  Diagnosis Date   Asthma    Endometriosis     Patient Active Problem List   Diagnosis Date Noted   Proteinuria 08/04/2018    Past Surgical History:  Procedure Laterality Date   ABDOMINAL SURGERY     MOUTH SURGERY      OB History    Gravida  1   Para      Term      Preterm      AB      Living        SAB      TAB      Ectopic      Multiple      Live Births               Home Medications    Prior to Admission medications   Medication Sig Start Date End Date Taking? Authorizing Provider  acetaminophen (TYLENOL) 500 MG tablet Take 1 tablet (500 mg total) by mouth every 6 (six) hours as needed. 07/29/19   Janace ArisBast, Traci A, NP  albuterol (PROVENTIL HFA;VENTOLIN HFA) 108 (90 Base) MCG/ACT inhaler Inhale 2 puffs into the lungs every 4 (four) hours as needed for wheezing or shortness of breath. 05/25/18   Maxwell CaulLayden, Lindsey A, PA-C  beclomethasone (QVAR) 40 MCG/ACT inhaler Inhale 1 puff into the lungs 2 (two) times daily. 05/25/18   Maxwell CaulLayden, Lindsey A, PA-C  benzonatate (TESSALON) 100 MG capsule Take 1 capsule (100 mg total) by mouth every 8 (eight) hours. Patient not taking: Reported on 08/11/2019 11/19/18   Wurst, [place], PA-C  budesonide-formoterol [facility](SYMBICORT) 160-4.5 MCG/ACT inhaler Inhale 2 puffs into the lungs 2 (two) times daily.    [provider]  cetirizine (ZYRTEC) 10 MG tablet Take 10 mg by mouth daily.    [provider]  ciprofloxacin (CIPRO) 500 MG tablet Take 1 tablet (500 mg total) by mouth every 12 (twelve) hours.  08/11/19   Lurline Idol, FNP  DULoxetine (CYMBALTA) 30 MG capsule Take 30 mg by mouth daily.    [provider]  fexofenadine (ALLEGRA) 180 MG tablet Take 180 mg by mouth daily. 01/27/18   [provider]  FLUoxetine (PROZAC) 20 MG tablet Take 20 mg by mouth daily.    [provider]  ibuprofen (ADVIL) 600 MG tablet Take 1 tablet (600 mg total) by mouth every 6 (six) hours as needed. 07/29/19   Dahlia Byes A, NP  medroxyPROGESTERone (DEPO-PROVERA) 150 MG/ML injection Inject 150 mg into the muscle every 3 (three) months.    [provider]  methocarbamol (ROBAXIN) 500 MG tablet Take 1 tablet (500 mg total) by mouth 2 (two) times daily. 08/11/19   Lurline Idol, FNP  montelukast (SINGULAIR) 10 MG tablet Take 10  mg by mouth at bedtime.    [provider]  nabumetone (RELAFEN) 500 MG tablet Take 500 mg by mouth daily.    [provider]  norethindrone (AYGESTIN) 5 MG tablet Take by mouth daily.    [provider]  pantoprazole (PROTONIX) 40 MG tablet Take 40 mg by mouth daily.    [provider]  predniSONE (DELTASONE) 20 MG tablet Take 1 tablet (20 mg total) by mouth daily for 5 days. 08/11/19 08/16/19  Lurline Idol, FNP  QUEtiapine (SEROQUEL) 25 MG tablet Take 25 mg by mouth daily as needed for sedation. 12/25/16   [provider]  tretinoin (RETIN-A) 0.025 % cream Apply 1 application topically every evening. 02/09/18   [provider]  triamcinolone (KENALOG) 0.1 % paste Use as directed 1 application in the mouth or throat as directed. Apply three to five times daily to affected area 05/20/18   [provider]    Family History Family History  Problem Relation Age of Onset   Heart disease Mother    Diabetes Father     Social History Social History   Tobacco Use   Smoking status: Passive Smoke Exposure - Never Smoker   Smokeless tobacco: Never Used  Substance Use Topics   Alcohol use: No   Drug use: Not on file     Allergies   Escitalopram and Naproxen   Review of Systems Review of Systems  Constitutional: Negative for appetite change and fever.  Gastrointestinal: Positive for abdominal pain and nausea. Negative for abdominal distention, diarrhea and vomiting.  Musculoskeletal: Negative.   Skin: Negative for rash.  Neurological: Negative.   All other systems reviewed and are negative.    Physical Exam Triage Vital Signs ED Triage Vitals [08/11/19 1203]  Enc Vitals Group     BP 105/71     Pulse Rate 80     Resp 18     Temp 98.2 F (36.8 C)     Temp Source Oral     SpO2 97 %     Weight      Height      Head Circumference      Peak Flow      Pain Score 7     Pain Loc      Pain Edu?      Excl. in  GC?    No data found.  Updated Vital Signs BP 105/71 (BP Location: Left Arm)    Pulse 80    Temp 98.2 F (36.8 C) (Oral)    Resp 18    SpO2 97%   Visual Acuity Right Eye Distance:   Left Eye Distance:   Bilateral  Distance:    Right Eye Near:   Left Eye Near:    Bilateral Near:     Physical Exam Vitals signs reviewed.  Constitutional:      Appearance: She is well-developed.  HENT:     Head: Normocephalic.  Cardiovascular:     Rate and Rhythm: Normal rate and regular rhythm.  Pulmonary:     Effort: Pulmonary effort is normal.  Abdominal:     General: Abdomen is flat. Bowel sounds are normal. There is no distension or abdominal bruit. There are no signs of injury.     Palpations: Abdomen is soft.     Tenderness: There is abdominal tenderness in the epigastric area.  Skin:    General: Skin is warm and dry.  Neurological:     General: No focal deficit present.     Mental Status: She is alert.      UC Treatments / Results  Labs (all labs ordered are listed, but only abnormal results are displayed) Labs Reviewed  POCT URINALYSIS DIP (DEVICE) - Abnormal; Notable for the following components:      Result Value   Leukocytes,Ua TRACE (*)    All other components within normal limits  POCT PREGNANCY, URINE    EKG   Radiology No results found.  Procedures Procedures (including critical care time)  Medications Ordered in UC Medications - No data to display  Initial Impression / Assessment and Plan / UC Course  I have reviewed the triage vital signs and the nursing notes.  Pertinent labs & imaging results that were available during my care of the patient were reviewed by me and considered in my medical decision making (see chart for details).    18 yo female presenting with diffused non radiating epigastric pain for the past month. Prior eval as well as today's exam suggestive of MSK etiology. The diagnosis was discussed with the patient and evaluation and  treatment plans outlined. Initiate empiric trial of antibiotcs and steroids to see if it will help patient's symptoms. Advised moist heat to affected areas at least three times daily. Contact GI ASAP to reschedule CT scan   Today's evaluation has revealed no signs of a dangerous process. Discussed diagnosis with patient and/or guardian. Patient and/or guardian aware of their diagnosis, possible red flag symptoms to watch out for and need for close follow up. Patient and/or guardian understands verbal and written discharge instructions. Patient and/or guardian comfortable with plan and disposition.  Patient and/or guardian has a clear mental status at this time, good insight into illness (after discussion and teaching) and has clear judgment to make decisions regarding their care  This care was provided during an unprecedented National Emergency due to the Novel Coronavirus (COVID-19) pandemic. COVID-19 infections and transmission risks place heavy strains on healthcare resources.  As this pandemic evolves, our facility, providers, and staff strive to respond fluidly, to remain operational, and to provide care relative to available resources and information. Outcomes are unpredictable and treatments are without well-defined guidelines. Further, the impact of COVID-19 on all aspects of urgent care, including the impact to patients seeking care for reasons other than COVID-19, is unavoidable during this national emergency. At this time of the global pandemic, management of patients has significantly changed, even for non-COVID positive patients given high local and regional COVID volumes at this time requiring high healthcare system and resource utilization. The standard of care for management of both COVID suspected and non-COVID suspected patients continues to change rapidly at the local,  regional, national, and global levels. This patient was worked up and treated to the best available but ever changing evidence  and resources available at this current time.   Documentation was completed with the aid of voice recognition software. Transcription may contain typographical errors.  Final Clinical Impressions(s) / UC Diagnoses   Final diagnoses:  Abdominal pain, epigastric  Nausea     Discharge Instructions     Take medications as prescribed. Drink plenty of fluids. Apply moist warm heat to affected areas at least three times a day. Call Gastroenterology to re-schedule your CT scan.     ED Prescriptions    Medication Sig Dispense Auth. Provider   ciprofloxacin (CIPRO) 500 MG tablet Take 1 tablet (500 mg total) by mouth every 12 (twelve) hours. 10 tablet Lurline Idol, FNP   predniSONE (DELTASONE) 20 MG tablet Take 1 tablet (20 mg total) by mouth daily for 5 days. 5 tablet Lurline Idol, FNP   methocarbamol (ROBAXIN) 500 MG tablet Take 1 tablet (500 mg total) by mouth 2 (two) times daily. 20 tablet Lurline Idol, FNP     PDMP not reviewed this encounter.   Lurline Idol, [place] 08/11/19 1242

## 2019-08-11 NOTE — Discharge Instructions (Addendum)
Take medications as prescribed. Drink plenty of fluids. Apply moist warm heat to affected areas at least three times a day. Call Gastroenterology to re-schedule your CT scan.

## 2019-08-11 NOTE — ED Triage Notes (Signed)
Pt here for abd pain and some soreness under breast; pt seen here for same on 9/25

## 2019-08-31 ENCOUNTER — Other Ambulatory Visit: Payer: Self-pay

## 2019-08-31 DIAGNOSIS — Z20822 Contact with and (suspected) exposure to covid-19: Secondary | ICD-10-CM

## 2019-09-01 LAB — NOVEL CORONAVIRUS, NAA: SARS-CoV-2, NAA: NOT DETECTED

## 2019-09-13 ENCOUNTER — Other Ambulatory Visit: Payer: Self-pay

## 2019-09-13 DIAGNOSIS — Z20828 Contact with and (suspected) exposure to other viral communicable diseases: Secondary | ICD-10-CM | POA: Diagnosis not present

## 2019-09-13 DIAGNOSIS — Z20822 Contact with and (suspected) exposure to covid-19: Secondary | ICD-10-CM

## 2019-09-16 LAB — NOVEL CORONAVIRUS, NAA: SARS-CoV-2, NAA: NOT DETECTED

## 2019-09-23 DIAGNOSIS — R319 Hematuria, unspecified: Secondary | ICD-10-CM | POA: Diagnosis not present

## 2019-09-23 DIAGNOSIS — G8929 Other chronic pain: Secondary | ICD-10-CM | POA: Diagnosis not present

## 2019-09-23 DIAGNOSIS — R809 Proteinuria, unspecified: Secondary | ICD-10-CM | POA: Diagnosis not present

## 2019-09-23 DIAGNOSIS — R109 Unspecified abdominal pain: Secondary | ICD-10-CM | POA: Diagnosis not present

## 2019-09-23 DIAGNOSIS — M549 Dorsalgia, unspecified: Secondary | ICD-10-CM | POA: Diagnosis not present

## 2019-09-23 DIAGNOSIS — N181 Chronic kidney disease, stage 1: Secondary | ICD-10-CM | POA: Diagnosis not present

## 2019-11-11 DIAGNOSIS — F411 Generalized anxiety disorder: Secondary | ICD-10-CM | POA: Diagnosis not present

## 2019-11-11 DIAGNOSIS — G8929 Other chronic pain: Secondary | ICD-10-CM | POA: Diagnosis not present

## 2019-11-11 DIAGNOSIS — K5909 Other constipation: Secondary | ICD-10-CM | POA: Diagnosis not present

## 2019-11-11 DIAGNOSIS — N809 Endometriosis, unspecified: Secondary | ICD-10-CM | POA: Diagnosis not present

## 2019-11-11 DIAGNOSIS — F332 Major depressive disorder, recurrent severe without psychotic features: Secondary | ICD-10-CM | POA: Diagnosis not present

## 2019-11-11 DIAGNOSIS — R109 Unspecified abdominal pain: Secondary | ICD-10-CM | POA: Diagnosis not present

## 2019-12-07 DIAGNOSIS — Z7721 Contact with and (suspected) exposure to potentially hazardous body fluids: Secondary | ICD-10-CM | POA: Diagnosis not present

## 2019-12-07 DIAGNOSIS — R748 Abnormal levels of other serum enzymes: Secondary | ICD-10-CM | POA: Diagnosis not present

## 2019-12-07 DIAGNOSIS — R5382 Chronic fatigue, unspecified: Secondary | ICD-10-CM | POA: Diagnosis not present

## 2019-12-07 DIAGNOSIS — F411 Generalized anxiety disorder: Secondary | ICD-10-CM | POA: Diagnosis not present

## 2019-12-07 DIAGNOSIS — F332 Major depressive disorder, recurrent severe without psychotic features: Secondary | ICD-10-CM | POA: Diagnosis not present

## 2019-12-07 DIAGNOSIS — N946 Dysmenorrhea, unspecified: Secondary | ICD-10-CM | POA: Diagnosis not present

## 2019-12-07 DIAGNOSIS — Z7251 High risk heterosexual behavior: Secondary | ICD-10-CM | POA: Diagnosis not present

## 2019-12-07 DIAGNOSIS — N809 Endometriosis, unspecified: Secondary | ICD-10-CM | POA: Diagnosis not present

## 2019-12-07 DIAGNOSIS — N939 Abnormal uterine and vaginal bleeding, unspecified: Secondary | ICD-10-CM | POA: Diagnosis not present

## 2019-12-07 DIAGNOSIS — R634 Abnormal weight loss: Secondary | ICD-10-CM | POA: Diagnosis not present

## 2019-12-08 ENCOUNTER — Emergency Department (HOSPITAL_COMMUNITY): Payer: Medicaid Other

## 2019-12-08 ENCOUNTER — Other Ambulatory Visit: Payer: Self-pay

## 2019-12-08 ENCOUNTER — Emergency Department (HOSPITAL_COMMUNITY)
Admission: EM | Admit: 2019-12-08 | Discharge: 2019-12-08 | Disposition: A | Discharge status: Home / Self Care | Payer: Medicaid Other | Attending: Emergency Medicine | Admitting: Emergency Medicine

## 2019-12-08 ENCOUNTER — Encounter (HOSPITAL_COMMUNITY): Payer: Self-pay | Admitting: Emergency Medicine

## 2019-12-08 DIAGNOSIS — Z7722 Contact with and (suspected) exposure to environmental tobacco smoke (acute) (chronic): Secondary | ICD-10-CM | POA: Insufficient documentation

## 2019-12-08 DIAGNOSIS — R079 Chest pain, unspecified: Secondary | ICD-10-CM

## 2019-12-08 DIAGNOSIS — R0789 Other chest pain: Secondary | ICD-10-CM | POA: Diagnosis not present

## 2019-12-08 DIAGNOSIS — J45909 Unspecified asthma, uncomplicated: Secondary | ICD-10-CM | POA: Insufficient documentation

## 2019-12-08 HISTORY — DX: Disorder of kidney and ureter, unspecified: N28.9

## 2019-12-08 LAB — HCG, QUANTITATIVE, PREGNANCY: hCG, Beta Chain, Quant, S: <1 m[IU]/mL (ref <5)

## 2019-12-08 LAB — URINALYSIS, ROUTINE W REFLEX MICROSCOPIC
Bilirubin Urine: NEGATIVE
Glucose, UA: NEGATIVE mg/dL
Ketones, ur: NEGATIVE mg/dL
Leukocytes,Ua: NEGATIVE
Nitrite: NEGATIVE
Protein, ur: NEGATIVE mg/dL
Specific Gravity, Urine: 1.016 (ref 1.005–1.030)
pH: 6 (ref 5.0–8.0)

## 2019-12-08 LAB — CBC
HCT: 42 % (ref 36.0–46.0)
Hemoglobin: 13.4 g/dL (ref 12.0–15.0)
MCH: 26 pg (ref 26.0–34.0)
MCHC: 31.9 g/dL (ref 30.0–36.0)
MCV: 81.4 fL (ref 80.0–100.0)
Platelets: 276 10*3/uL (ref 150–400)
RBC: 5.16 MIL/uL — ABNORMAL HIGH (ref 3.87–5.11)
RDW: 12.8 % (ref 11.5–15.5)
WBC: 4.4 10*3/uL (ref 4.0–10.5)
nRBC: 0 % (ref 0.0–0.2)

## 2019-12-08 LAB — BASIC METABOLIC PANEL
Anion gap: 6 (ref 5–15)
BUN: 8 mg/dL (ref 6–20)
CO2: 28 mmol/L (ref 22–32)
Calcium: 9 mg/dL (ref 8.9–10.3)
Chloride: 105 mmol/L (ref 98–111)
Creatinine, Ser: 0.64 mg/dL (ref 0.44–1.00)
GFR calc Af Amer: >60 mL/min (ref >60)
GFR calc non Af Amer: >60 mL/min (ref >60)
Glucose, Bld: 84 mg/dL (ref 70–99)
Potassium: 4 mmol/L (ref 3.5–5.1)
Sodium: 139 mmol/L (ref 135–145)

## 2019-12-08 LAB — TROPONIN I (HIGH SENSITIVITY): Troponin I (High Sensitivity): <2 ng/L (ref <18)

## 2019-12-08 MED ORDER — ALBUTEROL SULFATE HFA 108 (90 BASE) MCG/ACT IN AERS
4.0000 | INHALATION_SPRAY | Freq: Once | RESPIRATORY_TRACT | Status: AC
  Administered 2019-12-08: 4 via RESPIRATORY_TRACT
  Filled 2019-12-08: qty 6.7

## 2019-12-08 NOTE — Discharge Instructions (Signed)
Please follow-up with your primary care provider regarding today's encounter.  Sound as though you may need some of your medications refilled.  It is important that you take them as prescribed.  Please continue your asthma treatments in the event that this is an atypical asthma exacerbation.  Please also continue take your proton pump inhibitors, as prescribed.  However, given that this is constant, lower suspicion for GERD.  Please return to the ED or seek immediate medical attention for any new or worsening symptoms.

## 2019-12-08 NOTE — ED Triage Notes (Signed)
Pt also wondering why when she wipes sees blood that is bright red, she doesn't have cycles.

## 2019-12-08 NOTE — ED Provider Notes (Signed)
[facility]-EMERGENCY DEPT Provider Note   CSN: [id number] Arrival date & time: 12/08/19  1228     History Chief Complaint  Patient presents with  . Chest Pain    Hannah Mccarthy is a 19 y.o. female with PMH significant for moderate persistent asthma, endometriosis, and CKD who presents to the ED with a 2-week history of central chest pain/tightness.  She also endorses intermittent vaginal spotting and mild nonproductive cough.  Patient has a OB/GYN and was instructed by her primary care provider to schedule appointment with them for ongoing evaluation of her periodic spotting.  Patient also reports that she had recently been prescribed Diflucan, but was concerned that she may have a UTI despite increased urinary frequency or dysuria.  She denies any fevers or chills, recent illness, abdominal pain, nausea or vomiting, headache or dizziness, dysuria, vaginal discharge or itching, hematochezia, melena, or pain with defecation.  She also complains that her primary care provider did not adequately review her recent blood work with her and she was hoping to consult with [place] here in the ED about her findings.  She was concerned about a TSH of 1.18, which is well within the normal limits.  HPI     Past Medical History:  Diagnosis Date  . Asthma   . Endometriosis   . Renal disorder     Patient Active Problem List   Diagnosis Date Noted  . Proteinuria 08/04/2018    Past Surgical History:  Procedure Laterality Date  . ABDOMINAL SURGERY    . MOUTH SURGERY       OB History    Gravida  1   Para      Term      Preterm      AB      Living        SAB      TAB      Ectopic      Multiple      Live Births              Family History  Problem Relation Age of Onset  . Heart disease Mother   . Diabetes Father     Social History   Tobacco Use  . Smoking status: Passive Smoke Exposure - Never Smoker  . Smokeless tobacco: Never Used  Substance  Use Topics  . Alcohol use: No  . Drug use: Not on file    Home Medications Prior to Admission medications   Medication Sig Start Date End Date Taking? Authorizing Provider  albuterol (PROVENTIL HFA;VENTOLIN HFA) 108 (90 Base) MCG/ACT inhaler Inhale 2 puffs into the lungs every 4 (four) hours as needed for wheezing or shortness of breath. 05/25/18  Yes Graciella Freer A, PA-C  beclomethasone (QVAR) 40 MCG/ACT inhaler Inhale 1 puff into the lungs 2 (two) times daily. 05/25/18  Yes Maxwell Caul, PA-C  budesonide-formoterol (SYMBICORT) 160-4.5 MCG/ACT inhaler Inhale 2 puffs into the lungs 2 (two) times daily.   Yes [provider]  cetirizine (ZYRTEC) 10 MG tablet Take 10 mg by mouth daily.   Yes [provider]  DULoxetine (CYMBALTA) 30 MG capsule Take 30 mg by mouth daily as needed (pain).    Yes [provider]  fexofenadine (ALLEGRA) 180 MG tablet Take 180 mg by mouth daily. 01/27/18  Yes [provider]  LINZESS 72 MCG capsule Take 72 mcg by mouth daily. 11/11/19  Yes [provider]  methocarbamol (ROBAXIN) 500 MG tablet Take 1 tablet (  500 mg total) by mouth 2 (two) times daily. 08/11/19  Yes Lurline Idol, FNP  montelukast (SINGULAIR) 10 MG tablet Take 10 mg by mouth at bedtime.   Yes [provider]  nabumetone (RELAFEN) 500 MG tablet Take 500 mg by mouth 2 (two) times daily as needed for moderate pain.    Yes [provider]  pantoprazole (PROTONIX) 40 MG tablet Take 40 mg by mouth daily.   Yes [provider]  QUEtiapine (SEROQUEL) 25 MG tablet Take 25 mg by mouth daily as needed (sleep).  12/25/16  Yes [provider]  Vitamin D, Ergocalciferol, (DRISDOL) 1.25 MG (50000 UNIT) CAPS capsule Take 50,000 Units by mouth once a week. 08/01/19  Yes [provider]  acetaminophen (TYLENOL) 500 MG tablet Take 1 tablet (500 mg total) by mouth every 6 (six) hours as needed. 07/29/19   Dahlia Byes A, NP    benzonatate (TESSALON) 100 MG capsule Take 1 capsule (100 mg total) by mouth every 8 (eight) hours. Patient not taking: Reported on 08/11/2019 11/19/18   Wurst, [place], PA-C  ciprofloxacin (CIPRO) 500 MG tablet Take 1 tablet (500 mg total) by mouth every 12 (twelve) hours. Patient not taking: Reported on 12/08/2019 08/11/19   Lurline Idol, FNP  ibuprofen (ADVIL) 600 MG tablet Take 1 tablet (600 mg total) by mouth every 6 (six) hours as needed. Patient not taking: Reported on 12/08/2019 07/29/19   Dahlia Byes A, NP    Allergies    Escitalopram and Naproxen  Review of Systems   Review of Systems  Constitutional: Negative for chills and fever.  Respiratory: Positive for cough and chest tightness. Negative for shortness of breath and wheezing.   Cardiovascular: Positive for chest pain.    Physical Exam Updated Vital Signs BP 131/72   Pulse 68   Temp 98.3 F (36.8 C)   Resp 16   SpO2 99%   Physical Exam Vitals and nursing note reviewed. Exam conducted with a chaperone present.  Constitutional:      General: She is not in acute distress.    Appearance: Normal appearance. She is not ill-appearing.  HENT:     Head: Normocephalic and atraumatic.  Eyes:     General: No scleral icterus.    Conjunctiva/sclera: Conjunctivae normal.  Cardiovascular:     Rate and Rhythm: Normal rate and regular rhythm.     Pulses: Normal pulses.     Heart sounds: Normal heart sounds.  Pulmonary:     Effort: Pulmonary effort is normal. No respiratory distress.     Breath sounds: Normal breath sounds. No wheezing.  Abdominal:     General: Abdomen is flat. There is no distension.     Palpations: Abdomen is soft.     Tenderness: There is no abdominal tenderness. There is no guarding.  Genitourinary:    Comments: Patient refused. Skin:    General: Skin is dry.     Capillary Refill: Capillary refill takes less than 2 seconds.  Neurological:     Mental Status: She is alert and oriented to person,  place, and time.     GCS: GCS eye subscore is 4. GCS verbal subscore is 5. GCS motor subscore is 6.  Psychiatric:        Mood and Affect: Mood normal.        Behavior: Behavior normal.        Thought Content: Thought content normal.     ED Results / Procedures / Treatments   Labs (all labs  ordered are listed, but only abnormal results are displayed) Labs Reviewed  CBC - Abnormal; Notable for the following components:      Result Value   RBC 5.16 (*)    All other components within normal limits  URINALYSIS, ROUTINE W REFLEX MICROSCOPIC - Abnormal; Notable for the following components:   APPearance HAZY (*)    Hgb urine dipstick LARGE (*)    Bacteria, UA RARE (*)    All other components within normal limits  BASIC METABOLIC PANEL  HCG, QUANTITATIVE, PREGNANCY  TROPONIN I (HIGH SENSITIVITY)    EKG None  Radiology DG Chest 2 View  Result Date: 12/08/2019 CLINICAL DATA:  Intermittent chest pain for 2 weeks, asthma EXAM: CHEST - 2 VIEW COMPARISON:  07/29/2019 FINDINGS: The heart size and mediastinal contours are within normal limits. Both lungs are clear. The visualized skeletal structures are unremarkable. IMPRESSION: No active cardiopulmonary disease. Electronically Signed   By: Sharlet Salina M.D.   On: 12/08/2019 13:39    Procedures Procedures (including critical care time)  Medications Ordered in ED Medications  albuterol (VENTOLIN HFA) 108 (90 Base) MCG/ACT inhaler 4 puff (4 puffs Inhalation Given 12/08/19 2023)    ED Course  I have reviewed the triage vital signs and the nursing notes.  Pertinent labs & imaging results that were available during my care of the patient were reviewed by me and considered in my medical decision making (see chart for details).    MDM Rules/Calculators/A&P                      Patient reports that she has not been taking her asthma medications for several weeks and has resorted to using her grandmothers inhalers.  While she just saw her  primary care provider yesterday, she failed to mention this to them.  I provided her with 4 puffs albuterol here in the ED considering possibility that asthma exacerbations often will present as chest tightness.  She does endorse some improvement with albuterol administration.  I also considered reflux disease that she reports that it was worse after consuming soda this morning.  Recommending that she continue taking her proton pump inhibitors, as prescribed.  We will also encourage abortive medications that she can obtain over-the-counter such as Maalox for symptomatic relief.  However, given that she denies any specific pattern with her 2-week history of chest discomfort, I have lower suspicion for GERD as etiology.  I also believe that there is element of anxiety that may be contributing to her chest pain as she is concerned about lab values that are within normal limits.  She also feels  DG chest demonstrates no evidence of pneumothorax or pneumonia that would explain her chest discomfort.  BMP demonstrated no abnormalities.  CBC shows no anemia or leukocytosis concerning for infection.  Her vital signs have all been within normal limits throughout her duration here in the ED.  Her initial troponin is less than 2, given chronicity of her illness do not need to trend.  Beta-hCG is less than 1.  EKG demonstrated no evidence of WPW, Brugada, or other arrhythmia.  She is PERC negative.  We also obtained a UA considering her concern for UTI.  No evidence to suggest UTI at this time and a large Hgb was to be expected given her reported spotting.  She plans to follow-up with her GYN regarding her several week history of spotting and denies any other vaginal discharge, vaginal pain, itching, pain with defecation, dyspareunia,  or other symptoms.  Offered pelvic and rectal exam to assess for hemorrhoids or other possible pathology, which she declined.  Her primary concern was her chest discomfort.  Strict return  precautions discussed with the patient.  She is reassured by today's findings.  All of the evaluation and work-up results were discussed with the patient and any family at bedside. They were provided opportunity to ask any additional questions and have none at this time. They have expressed understanding of verbal discharge instructions as well as return precautions and are agreeable to the plan.    Final Clinical Impression(s) / ED Diagnoses Final diagnoses:  Nonspecific chest pain    Rx / DC Orders ED Discharge Orders    None       Reita Chard 12/08/19 2054    Dorie Rank, MD 12/12/19 [phone]

## 2019-12-08 NOTE — ED Triage Notes (Signed)
Pt c/o intermittent chest pains for 2 weeks. Reports was seen at PCP office yesterday.

## 2019-12-21 DIAGNOSIS — N941 Unspecified dyspareunia: Secondary | ICD-10-CM | POA: Diagnosis not present

## 2019-12-21 DIAGNOSIS — N809 Endometriosis, unspecified: Secondary | ICD-10-CM | POA: Diagnosis not present

## 2019-12-21 DIAGNOSIS — Z30013 Encounter for initial prescription of injectable contraceptive: Secondary | ICD-10-CM | POA: Diagnosis not present

## 2019-12-21 DIAGNOSIS — Z3202 Encounter for pregnancy test, result negative: Secondary | ICD-10-CM | POA: Diagnosis not present

## 2020-01-05 ENCOUNTER — Encounter (HOSPITAL_COMMUNITY): Payer: Self-pay | Admitting: Emergency Medicine

## 2020-01-05 ENCOUNTER — Other Ambulatory Visit: Payer: Self-pay

## 2020-01-05 ENCOUNTER — Ambulatory Visit (HOSPITAL_COMMUNITY)
Admission: EM | Admit: 2020-01-05 | Discharge: 2020-01-05 | Disposition: A | Discharge status: Home / Self Care | Payer: Medicaid Other | Attending: Physician Assistant | Admitting: Physician Assistant

## 2020-01-05 DIAGNOSIS — R1084 Generalized abdominal pain: Secondary | ICD-10-CM | POA: Diagnosis not present

## 2020-01-05 DIAGNOSIS — K6289 Other specified diseases of anus and rectum: Secondary | ICD-10-CM | POA: Diagnosis not present

## 2020-01-05 MED ORDER — LIDOCAINE HCL 2 % EX GEL: 1.0000 "application " | CUTANEOUS | 0 refills | Status: DC | PRN

## 2020-01-05 MED ORDER — POLYETHYLENE GLYCOL 3350 17 GM/SCOOP PO POWD: 17.0000 g | Freq: Every day | ORAL | 0 refills | Status: AC

## 2020-01-05 MED ORDER — MESALAMINE 1000 MG RE SUPP: 1000.0000 mg | Freq: Every day | RECTAL | 12 refills | Status: DC

## 2020-01-05 NOTE — ED Triage Notes (Signed)
Pt here for pain in rectum from hemorrhoids with hx of same

## 2020-01-05 NOTE — Discharge Instructions (Addendum)
Begin using the MiraLAX daily.  1 capful in a beverage of your choice.  Drink plenty of water.  I want you to use the mesalamine suppository 1 tab rectally each night.  You may also apply the lidocaine jelly rectally to help with your symptoms.  You need to follow-up with your primary care or the gastroenterologist that you have previously seen in 1 to 2 weeks for reevaluation  If your abdominal pain gets significantly worse or you develop fever I want you to go to the emergency department for further evaluation

## 2020-01-05 NOTE — ED Provider Notes (Signed)
[name]    CSN: [id number] Arrival date & time: 01/05/20  1730      History   Chief Complaint Chief Complaint  Patient presents with  . Appointment    530  . Hemorrhoids    HPI Hannah Mccarthy is a 19 y.o. female.   Patient with a history of chronic abdominal pain presents today to urgent care for 1 week complaint of rectal pain.  She attributes this to hemorrhoids.  She reports mucousy yellow discharge on her stool and on the paper after she wipes.  She reports occasional blood tingeing in this mucus and on the paper.  She reports issues with constipation is being treated with Linzess currently by a primary care provider.  She reports generally 1 to 3 days in between bowel movements.  She reports these are typically hard and painful to pass.  She denies blood in the stool or dark-colored stool.  She denies episodes of diarrhea.  She reports lower abdominal and left-sided abdominal pain today that has been a chronic issue for her.  She denies nausea and vomiting.  She reports her rectal pain is made it painful to sit down.  She has not felt any external hemorrhoids.  She denies fever and chills.  She denies bloating, belching or gas pains.  She reports around 20 pound weight loss over the last 2 to 3 months that has not been purposeful.  She is actually trying to gain weight.  Denies significant NSAID use as she states she cannot take these due to chronic kidney issue.  He reports she has previously seen gastroenterologist however missed her last appointment due to work issues.     Past Medical History:  Diagnosis Date  . Asthma   . Endometriosis   . Renal disorder     Patient Active Problem List   Diagnosis Date Noted  . Proteinuria 08/04/2018    Past Surgical History:  Procedure Laterality Date  . ABDOMINAL SURGERY    . MOUTH SURGERY      OB History    Gravida  1   Para      Term      Preterm      AB      Living        SAB      TAB      Ectopic      Multiple      Live Births               Home Medications    Prior to Admission medications   Medication Sig Start Date End Date Taking? Authorizing Provider  acetaminophen (TYLENOL) 500 MG tablet Take 1 tablet (500 mg total) by mouth every 6 (six) hours as needed. 07/29/19   Orvan July, NP  albuterol (PROVENTIL HFA;VENTOLIN HFA) 108 (90 Base) MCG/ACT inhaler Inhale 2 puffs into the lungs every 4 (four) hours as needed for wheezing or shortness of breath. 05/25/18   Volanda Napoleon, PA-C  beclomethasone (QVAR) 40 MCG/ACT inhaler Inhale 1 puff into the lungs 2 (two) times daily. 05/25/18   Volanda Napoleon, PA-C  benzonatate (TESSALON) 100 MG capsule Take 1 capsule (100 mg total) by mouth every 8 (eight) hours. Patient not taking: Reported on 08/11/2019 11/19/18   Wurst, [place], PA-C  budesonide-formoterol [facility]) 160-4.5 MCG/ACT inhaler Inhale 2 puffs into the lungs 2 (two) times daily.    [provider]  cetirizine (ZYRTEC) 10 MG tablet Take 10 mg by mouth  daily.    [provider]  ciprofloxacin (CIPRO) 500 MG tablet Take 1 tablet (500 mg total) by mouth every 12 (twelve) hours. Patient not taking: Reported on 12/08/2019 08/11/19   Lurline Idol, FNP  DULoxetine (CYMBALTA) 30 MG capsule Take 30 mg by mouth daily as needed (pain).     [provider]  fexofenadine (ALLEGRA) 180 MG tablet Take 180 mg by mouth daily. 01/27/18   [provider]  ibuprofen (ADVIL) 600 MG tablet Take 1 tablet (600 mg total) by mouth every 6 (six) hours as needed. Patient not taking: Reported on 12/08/2019 07/29/19   Dahlia Byes A, NP  lidocaine (XYLOCAINE) 2 % jelly Apply 1 application topically as needed. 01/05/20   Naz Denunzio, Veryl Speak, PA-C  LINZESS 72 MCG capsule Take 72 mcg by mouth daily. 11/11/19   [provider]  mesalamine (CANASA) 1000 MG suppository Place 1 suppository (1,000 mg total) rectally at bedtime. 01/05/20   Marah Park, Veryl Speak, PA-C    methocarbamol (ROBAXIN) 500 MG tablet Take 1 tablet (500 mg total) by mouth 2 (two) times daily. Patient not taking: Reported on 01/05/2020 08/11/19   Lurline Idol, FNP  montelukast (SINGULAIR) 10 MG tablet Take 10 mg by mouth at bedtime.    [provider]  nabumetone (RELAFEN) 500 MG tablet Take 500 mg by mouth 2 (two) times daily as needed for moderate pain.     [provider]  pantoprazole (PROTONIX) 40 MG tablet Take 40 mg by mouth daily.    [provider]  polyethylene glycol powder (GLYCOLAX/MIRALAX) 17 GM/SCOOP powder Take 17 g by mouth daily for 30 doses. 01/05/20 02/04/20  Sahory Nordling, Veryl Speak, PA-C  QUEtiapine (SEROQUEL) 25 MG tablet Take 25 mg by mouth daily as needed (sleep).  12/25/16   [provider]  Vitamin D, Ergocalciferol, (DRISDOL) 1.25 MG (50000 UNIT) CAPS capsule Take 50,000 Units by mouth once a week. 08/01/19   [provider]    Family History Family History  Problem Relation Age of Onset  . Heart disease Mother   . Diabetes Father     Social History Social History   Tobacco Use  . Smoking status: Passive Smoke Exposure - Never Smoker  . Smokeless tobacco: Never Used  Substance Use Topics  . Alcohol use: No  . Drug use: Not on file     Allergies   Escitalopram and Naproxen   Review of Systems Review of Systems  Constitutional: Negative for chills and fever.  HENT: Negative for ear pain.   Eyes: Negative for pain and visual disturbance.  Cardiovascular: Negative for chest pain and palpitations.  Gastrointestinal: Positive for abdominal pain, anal bleeding, constipation and rectal pain. Negative for blood in stool, diarrhea, nausea and vomiting.  Genitourinary: Negative for dysuria, flank pain, frequency, hematuria, vaginal bleeding, vaginal discharge and vaginal pain.  Musculoskeletal: Negative for arthralgias, back pain and myalgias.  Skin: Negative for color change and rash.  Neurological: Negative for  seizures, syncope and headaches.  All other systems reviewed and are negative.    Physical Exam Triage Vital Signs ED Triage Vitals [01/05/20 1800]  Enc Vitals Group     BP 109/73     Pulse Rate 77     Resp 18     Temp 98.4 F (36.9 C)     Temp Source Oral     SpO2 97 %     Weight      Height      Head Circumference  Peak Flow      Pain Score 8     Pain Loc      Pain Edu?      Excl. in GC?    No data found.  Updated Vital Signs BP 109/73 (BP Location: Right Arm)   Pulse 77   Temp 98.4 F (36.9 C) (Oral)   Resp 18   SpO2 97%   Visual Acuity Right Eye Distance:   Left Eye Distance:   Bilateral Distance:    Right Eye Near:   Left Eye Near:    Bilateral Near:     Physical Exam Vitals and nursing note reviewed.  Constitutional:      General: She is not in acute distress.    Appearance: Normal appearance. She is well-developed. She is not ill-appearing.  HENT:     Head: Normocephalic and atraumatic.  Eyes:     Conjunctiva/sclera: Conjunctivae normal.  Cardiovascular:     Rate and Rhythm: Normal rate and regular rhythm.     Heart sounds: No murmur.  Pulmonary:     Effort: Pulmonary effort is normal. No respiratory distress.     Breath sounds: Normal breath sounds.  Abdominal:     General: Abdomen is flat. Bowel sounds are normal. There is no distension.     Palpations: Abdomen is soft. There is no mass.     Tenderness: There is abdominal tenderness (Generalized with more tenderness on the left aspects of the transverse and descending colon.  Rated at a 5/10 no rebound or rigidity).  Genitourinary:    Comments: No evidence of external hemorrhoids or obvious anal fissure Musculoskeletal:     Cervical back: Neck supple.  Skin:    General: Skin is warm and dry.     Coloration: Skin is not jaundiced.     Findings: No rash.  Neurological:     General: No focal deficit present.     Mental Status: She is alert and oriented to person, place, and time.    Psychiatric:        Mood and Affect: Mood normal.        Behavior: Behavior normal.        Thought Content: Thought content normal.        Judgment: Judgment normal.      UC Treatments / Results  Labs (all labs ordered are listed, but only abnormal results are displayed) Labs Reviewed - No data to display  EKG   Radiology No results found.  Procedures Procedures (including critical care time)  Medications Ordered in UC Medications - No data to display  Initial Impression / Assessment and Plan / UC Course  I have reviewed the triage vital signs and the nursing notes.  Pertinent labs & imaging results that were available during my care of the patient were reviewed by me and considered in my medical decision making (see chart for details).     #Rectal pain #Presenting today Patient a 19 year old with history of chronic abdominal pain presenting today with rectal pain and abdominal pain.  Given no obvious external hemorrhoid or fissure differential would be internal hemorrhoid versus proctitis for cause of rectal pain.  Abdominal pain with weight loss concerning for chronic bowel disease celiac versus IBD.  Given no significant blood in stool, afebrile and hemodynamically stable will treat symptomatically today with strict follow-up precautions with primary care or gastroenterologist that she is previously seen for further work-up.  Patient advised that she is to report to the emergency department if  worsening abdominal pain, significant blood in her stool or develops fever.   -MiraLAX prescribed for stool softener -Topical lidocaine jelly -Mesalamine suppository prescribed -Patient understands plan and agrees and states she will schedule next appointment with GI and keep this appointment.  Final Clinical Impressions(s) / UC Diagnoses   Final diagnoses:  Rectal pain  Generalized abdominal pain     Discharge Instructions     Begin using the MiraLAX daily.  1 capful in  a beverage of your choice.  Drink plenty of water.  I want you to use the mesalamine suppository 1 tab rectally each night.  You may also apply the lidocaine jelly rectally to help with your symptoms.  You need to follow-up with your primary care or the gastroenterologist that you have previously seen in 1 to 2 weeks for reevaluation  If your abdominal pain gets significantly worse or you develop fever I want you to go to the emergency department for further evaluation      ED Prescriptions    Medication Sig Dispense Auth. Provider   polyethylene glycol powder (GLYCOLAX/MIRALAX) 17 GM/SCOOP powder Take 17 g by mouth daily for 30 doses. 507 g Courtland Coppa, Veryl Speak, PA-C   mesalamine (CANASA) 1000 MG suppository Place 1 suppository (1,000 mg total) rectally at bedtime. 30 suppository Lucelia Lacey, Veryl Speak, PA-C   lidocaine (XYLOCAINE) 2 % jelly Apply 1 application topically as needed. 30 mL Katye Valek, Veryl Speak, PA-C     PDMP not reviewed this encounter.   Hermelinda Medicus, PA-C 01/05/20 1912

## 2020-01-16 DIAGNOSIS — R748 Abnormal levels of other serum enzymes: Secondary | ICD-10-CM | POA: Insufficient documentation

## 2020-02-03 DIAGNOSIS — N898 Other specified noninflammatory disorders of vagina: Secondary | ICD-10-CM | POA: Diagnosis not present

## 2020-02-03 DIAGNOSIS — N926 Irregular menstruation, unspecified: Secondary | ICD-10-CM | POA: Diagnosis not present

## 2020-02-06 DIAGNOSIS — Z3042 Encounter for surveillance of injectable contraceptive: Secondary | ICD-10-CM | POA: Diagnosis not present

## 2020-02-06 DIAGNOSIS — A749 Chlamydial infection, unspecified: Secondary | ICD-10-CM | POA: Diagnosis not present

## 2020-02-06 DIAGNOSIS — N921 Excessive and frequent menstruation with irregular cycle: Secondary | ICD-10-CM | POA: Diagnosis not present

## 2020-02-06 DIAGNOSIS — Z0000 Encounter for general adult medical examination without abnormal findings: Secondary | ICD-10-CM | POA: Diagnosis not present

## 2020-02-06 DIAGNOSIS — R102 Pelvic and perineal pain: Secondary | ICD-10-CM | POA: Diagnosis not present

## 2020-02-06 DIAGNOSIS — N9411 Superficial (introital) dyspareunia: Secondary | ICD-10-CM | POA: Diagnosis not present

## 2020-02-06 DIAGNOSIS — Z9889 Other specified postprocedural states: Secondary | ICD-10-CM | POA: Diagnosis not present

## 2020-02-06 DIAGNOSIS — Z124 Encounter for screening for malignant neoplasm of cervix: Secondary | ICD-10-CM | POA: Diagnosis not present

## 2020-02-06 DIAGNOSIS — Z01419 Encounter for gynecological examination (general) (routine) without abnormal findings: Secondary | ICD-10-CM | POA: Diagnosis not present

## 2020-02-06 DIAGNOSIS — N809 Endometriosis, unspecified: Secondary | ICD-10-CM | POA: Diagnosis not present

## 2020-02-09 DIAGNOSIS — J4540 Moderate persistent asthma, uncomplicated: Secondary | ICD-10-CM | POA: Diagnosis not present

## 2020-02-09 DIAGNOSIS — R0789 Other chest pain: Secondary | ICD-10-CM | POA: Diagnosis not present

## 2020-02-17 ENCOUNTER — Other Ambulatory Visit: Payer: Self-pay

## 2020-02-17 ENCOUNTER — Encounter (HOSPITAL_COMMUNITY): Payer: Self-pay

## 2020-02-17 ENCOUNTER — Ambulatory Visit (HOSPITAL_COMMUNITY)
Admission: EM | Admit: 2020-02-17 | Discharge: 2020-02-17 | Disposition: A | Discharge status: Home / Self Care | Payer: Medicaid Other | Attending: Emergency Medicine | Admitting: Emergency Medicine

## 2020-02-17 DIAGNOSIS — Z20822 Contact with and (suspected) exposure to covid-19: Secondary | ICD-10-CM | POA: Diagnosis not present

## 2020-02-17 NOTE — ED Triage Notes (Signed)
Pt is here wanting COVID testing today including a work note. Pt is denying ALL symptoms today.

## 2020-02-17 NOTE — Discharge Instructions (Addendum)
Monitor MyChart for results °

## 2020-02-17 NOTE — ED Provider Notes (Signed)
[facility]    CSN: [id number] Arrival date & time: 02/17/20  1856      History   Chief Complaint Chief Complaint  Patient presents with  . COVID testing    HPI Hannah Mccarthy is a 19 y.o. female history of asthma, presenting today for Covid testing.  Patient notes that her sister who she lives with recently returned from [place] approximately 1 week ago.  Her sisters been sick with URI symptoms.  Her sister has not been tested for Covid.  Patient is concerned given this exposure of possible Covid.  She herself has had loss of appetite, but denies any other symptoms.  Denies URI symptoms.  Denies GI symptoms.  HPI  Past Medical History:  Diagnosis Date  . Asthma   . Endometriosis   . Renal disorder     Patient Active Problem List   Diagnosis Date Noted  . Proteinuria 08/04/2018    Past Surgical History:  Procedure Laterality Date  . ABDOMINAL SURGERY    . MOUTH SURGERY      OB History    Gravida  1   Para      Term      Preterm      AB      Living        SAB      TAB      Ectopic      Multiple      Live Births               Home Medications    Prior to Admission medications   Medication Sig Start Date End Date Taking? Authorizing Provider  budesonide-formoterol (SYMBICORT) 160-4.5 MCG/ACT inhaler Inhale into the lungs. 02/09/20  Yes [provider]  ergocalciferol (VITAMIN D2) 1.25 MG (50000 UT) capsule Take by mouth. 01/27/20  Yes [provider]  estradiol (ESTRACE) 2 MG tablet Take by mouth. 02/06/20  Yes [provider]  lidocaine (XYLOCAINE) 2 % solution Use about 20 min before intercourse at the vaginal opening 02/06/20  Yes [provider]  metroNIDAZOLE (FLAGYL) 500 MG tablet Take 1 pill by mouth Twice a day 02/03/20  Yes [provider]  acetaminophen (TYLENOL) 500 MG tablet Take 1 tablet (500 mg total) by mouth every 6 (six) hours as needed. 07/29/19   Janace Aris, NP  albuterol  (PROVENTIL HFA;VENTOLIN HFA) 108 (90 Base) MCG/ACT inhaler Inhale 2 puffs into the lungs every 4 (four) hours as needed for wheezing or shortness of breath. 05/25/18   Maxwell Caul, PA-C  beclomethasone (QVAR) 40 MCG/ACT inhaler Inhale 1 puff into the lungs 2 (two) times daily. 05/25/18   Maxwell Caul, PA-C  budesonide-formoterol (SYMBICORT) 160-4.5 MCG/ACT inhaler Inhale 2 puffs into the lungs 2 (two) times daily.    [provider]  cetirizine (ZYRTEC) 10 MG tablet Take 10 mg by mouth daily.    [provider]  DULoxetine (CYMBALTA) 30 MG capsule Take 30 mg by mouth daily as needed (pain).     [provider]  fexofenadine (ALLEGRA) 180 MG tablet Take 180 mg by mouth daily. 01/27/18   [provider]  fluconazole (DIFLUCAN) 150 MG tablet TAKE ONE TABLET TODAY, AND THEN 1 TABLET ON DAY 4. 12/02/19   [provider]  lidocaine (XYLOCAINE) 2 % jelly Apply 1 application topically as needed. 01/05/20   Darr, Veryl Speak, PA-C  LINZESS 72 MCG capsule Take 72 mcg by mouth daily. 11/11/19   [provider]  mesalamine (CANASA) 1000 MG suppository Place 1 suppository (1,000 mg total) rectally at bedtime. 01/05/20   Darr, Marguerita Beards, PA-C  montelukast (SINGULAIR) 10 MG tablet Take 10 mg by mouth at bedtime.    [provider]  nabumetone (RELAFEN) 500 MG tablet Take 500 mg by mouth 2 (two) times daily as needed for moderate pain.     [provider]  pantoprazole (PROTONIX) 40 MG tablet Take 40 mg by mouth daily.    [provider]  QUEtiapine (SEROQUEL) 25 MG tablet Take 25 mg by mouth daily as needed (sleep).  12/25/16   [provider]  Vitamin D, Ergocalciferol, (DRISDOL) 1.25 MG (50000 UNIT) CAPS capsule Take 50,000 Units by mouth once a week. 08/01/19   [provider]    Family History Family History  Problem Relation Age of Onset  . Heart disease Mother   . Diabetes Father     Social History Social  History   Tobacco Use  . Smoking status: Passive Smoke Exposure - Never Smoker  . Smokeless tobacco: Never Used  Substance Use Topics  . Alcohol use: No  . Drug use: Not Currently     Allergies   Escitalopram and Naproxen   Review of Systems Review of Systems  Constitutional: Positive for appetite change. Negative for activity change, chills, fatigue and fever.  HENT: Negative for congestion, ear pain, rhinorrhea, sinus pressure, sore throat and trouble swallowing.   Eyes: Negative for discharge and redness.  Respiratory: Negative for cough, chest tightness and shortness of breath.   Cardiovascular: Negative for chest pain.  Gastrointestinal: Negative for abdominal pain, diarrhea, nausea and vomiting.  Musculoskeletal: Negative for myalgias.  Skin: Negative for rash.  Neurological: Negative for dizziness, light-headedness and headaches.     Physical Exam Triage Vital Signs ED Triage Vitals  Enc Vitals Group     BP 02/17/20 1909 106/66     Pulse Rate 02/17/20 1909 99     Resp 02/17/20 1909 16     Temp 02/17/20 1909 98.9 F (37.2 C)     Temp Source 02/17/20 1909 Oral     SpO2 02/17/20 1909 97 %     Weight 02/17/20 1912 124 lb 9.6 oz (56.5 kg)     Height --      Head Circumference --      Peak Flow --      Pain Score 02/17/20 1903 0     Pain Loc --      Pain Edu? --      Excl. in [place]? --    No data found.  Updated Vital Signs BP 106/66 (BP Location: Left Arm)   Pulse 99   Temp 98.9 F (37.2 C) (Oral)   Resp 16   Wt 124 lb 9.6 oz (56.5 kg)   LMP 01/25/2020   SpO2 97%   Breastfeeding No   BMI 20.73 kg/m   Visual Acuity Right Eye Distance:   Left Eye Distance:   Bilateral Distance:    Right Eye Near:   Left Eye Near:    Bilateral Near:     Physical Exam Vitals and nursing note reviewed.  Constitutional:      Appearance: She is well-developed.     Comments: No acute distress  HENT:     Head: Normocephalic and atraumatic.     Nose: Nose normal.    Eyes:     Conjunctiva/sclera: Conjunctivae normal.  Cardiovascular:     Rate and Rhythm: Normal rate.  Pulmonary:     Effort: Pulmonary effort is normal. No respiratory distress.     Comments: Breathing comfortably at rest, CTABL, no wheezing, rales or other adventitious sounds auscultated Abdominal:     General: There is no distension.  Musculoskeletal:        General: Normal range of motion.     Cervical back: Neck supple.  Skin:    General: Skin is warm and dry.  Neurological:     Mental Status: She is alert and oriented to person, place, and time.      UC Treatments / Results  Labs (all labs ordered are listed, but only abnormal results are displayed) Labs Reviewed  SARS CORONAVIRUS 2 (TAT 6-24 HRS)    EKG   Radiology No results found.  Procedures Procedures (including critical care time)  Medications Ordered in UC Medications - No data to display  Initial Impression / Assessment and Plan / UC Course  I have reviewed the triage vital signs and the nursing notes.  Pertinent labs & imaging results that were available during my care of the patient were reviewed by me and considered in my medical decision making (see chart for details).     Covid PCR pending, possible Covid exposure.  Currently asymptomatic.  Monitor for development of any further symptoms.  Discussed strict return precautions. Patient verbalized understanding and is agreeable with plan.  Final Clinical Impressions(s) / UC Diagnoses   Final diagnoses:  Encounter for laboratory testing for COVID-19 virus     Discharge Instructions     Monitor MyChart for results   ED Prescriptions    None     PDMP not reviewed this encounter.   Lew Dawes, [place] 02/17/20 2057

## 2020-02-18 LAB — SARS CORONAVIRUS 2 (TAT 6-24 HRS): SARS Coronavirus 2: NEGATIVE

## 2020-02-21 ENCOUNTER — Other Ambulatory Visit: Payer: Self-pay

## 2020-02-21 ENCOUNTER — Ambulatory Visit (HOSPITAL_COMMUNITY)
Admission: EM | Admit: 2020-02-21 | Discharge: 2020-02-21 | Disposition: A | Discharge status: Home / Self Care | Payer: Medicaid Other | Attending: Family Medicine | Admitting: Family Medicine

## 2020-02-21 ENCOUNTER — Encounter (HOSPITAL_COMMUNITY): Payer: Self-pay

## 2020-02-21 DIAGNOSIS — J209 Acute bronchitis, unspecified: Secondary | ICD-10-CM

## 2020-02-21 DIAGNOSIS — J4520 Mild intermittent asthma, uncomplicated: Secondary | ICD-10-CM

## 2020-02-21 DIAGNOSIS — J029 Acute pharyngitis, unspecified: Secondary | ICD-10-CM

## 2020-02-21 MED ORDER — PREDNISONE 20 MG PO TABS: 40.0000 mg | ORAL_TABLET | Freq: Every day | ORAL | 0 refills | Status: AC

## 2020-02-21 MED ORDER — AMOXICILLIN 875 MG PO TABS: 875.0000 mg | ORAL_TABLET | Freq: Two times a day (BID) | ORAL | 0 refills | Status: DC

## 2020-02-21 NOTE — ED Triage Notes (Signed)
Pt presents with central chest pain that radiates through to back and gives her the sensation of shortness of breath; pt also complains of sore throat X 2 weeks.  Pt had negative covid test 4 days ago.

## 2020-02-21 NOTE — ED Provider Notes (Signed)
[facility]    CSN: [id number] Arrival date & time: 02/21/20  1928      History   Chief Complaint Chief Complaint  Patient presents with  . Chest Pain  . Sore Throat    HPI Hannah Mccarthy is a 19 y.o. female.   HPI Patient with a history of asthma resents with a concern of chest tightness and shortness of breath which is unrelieved with her current asthma treatment.  She was tested for Covid 4 days ago her results were negative.  He does work in a [employer] but denies any known exposures to COVID-19.  She also has had intermittent sore throat for 2 weeks and nasal congestion.  She also notes recent increase in frequency of her asthma symptoms.  Denies any wheezing predominant symptom is shortness of breath and cough. Shortness of breath worsens when she lays down. She is afebrile. She is tolerating food and drink. Past Medical History:  Diagnosis Date  . Asthma   . Endometriosis   . Renal disorder     Patient Active Problem List   Diagnosis Date Noted  . Proteinuria 08/04/2018    Past Surgical History:  Procedure Laterality Date  . ABDOMINAL SURGERY    . MOUTH SURGERY      OB History    Gravida  1   Para      Term      Preterm      AB      Living        SAB      TAB      Ectopic      Multiple      Live Births               Home Medications    Prior to Admission medications   Medication Sig Start Date End Date Taking? Authorizing Provider  acetaminophen (TYLENOL) 500 MG tablet Take 1 tablet (500 mg total) by mouth every 6 (six) hours as needed. 07/29/19   Orvan July, NP  albuterol (PROVENTIL HFA;VENTOLIN HFA) 108 (90 Base) MCG/ACT inhaler Inhale 2 puffs into the lungs every 4 (four) hours as needed for wheezing or shortness of breath. 05/25/18   Volanda Napoleon, PA-C  beclomethasone (QVAR) 40 MCG/ACT inhaler Inhale 1 puff into the lungs 2 (two) times daily. 05/25/18   Volanda Napoleon, PA-C  budesonide-formoterol (SYMBICORT)  160-4.5 MCG/ACT inhaler Inhale 2 puffs into the lungs 2 (two) times daily.    [provider]  budesonide-formoterol (SYMBICORT) 160-4.5 MCG/ACT inhaler Inhale into the lungs. 02/09/20   [provider]  cetirizine (ZYRTEC) 10 MG tablet Take 10 mg by mouth daily.    [provider]  DULoxetine (CYMBALTA) 30 MG capsule Take 30 mg by mouth daily as needed (pain).     [provider]  ergocalciferol (VITAMIN D2) 1.25 MG (50000 UT) capsule Take by mouth. 01/27/20   [provider]  estradiol (ESTRACE) 2 MG tablet Take by mouth. 02/06/20   [provider]  fexofenadine (ALLEGRA) 180 MG tablet Take 180 mg by mouth daily. 01/27/18   [provider]  fluconazole (DIFLUCAN) 150 MG tablet TAKE ONE TABLET TODAY, AND THEN 1 TABLET ON DAY 4. 12/02/19   [provider]  lidocaine (XYLOCAINE) 2 % jelly Apply 1 application topically as needed. 01/05/20   Darr, Marguerita Beards, PA-C  lidocaine (XYLOCAINE) 2 % solution Use about 20 min before intercourse at the vaginal opening 02/06/20   [provider]  LINZESS 72 MCG capsule Take 72 mcg by mouth daily. 11/11/19   [provider]  mesalamine (CANASA) 1000 MG suppository Place 1 suppository (1,000 mg total) rectally at bedtime. 01/05/20   Darr, Veryl Speak, PA-C  metroNIDAZOLE (FLAGYL) 500 MG tablet Take 1 pill by mouth Twice a day 02/03/20   [provider]  montelukast (SINGULAIR) 10 MG tablet Take 10 mg by mouth at bedtime.    [provider]  nabumetone (RELAFEN) 500 MG tablet Take 500 mg by mouth 2 (two) times daily as needed for moderate pain.     [provider]  pantoprazole (PROTONIX) 40 MG tablet Take 40 mg by mouth daily.    [provider]  predniSONE (DELTASONE) 20 MG tablet Take 2 tablets (40 mg total) by mouth daily with breakfast for 5 days. 02/21/20 02/26/20  Bing Neighbors, FNP  QUEtiapine (SEROQUEL) 25 MG tablet Take 25 mg by mouth daily as needed  (sleep).  12/25/16   [provider]  Vitamin D, Ergocalciferol, (DRISDOL) 1.25 MG (50000 UNIT) CAPS capsule Take 50,000 Units by mouth once a week. 08/01/19   [provider]    Family History Family History  Problem Relation Age of Onset  . Heart disease Mother   . Diabetes Father     Social History Social History   Tobacco Use  . Smoking status: Passive Smoke Exposure - Never Smoker  . Smokeless tobacco: Never Used  Substance Use Topics  . Alcohol use: No  . Drug use: Not Currently     Allergies   Escitalopram and Naproxen   Review of Systems Review of Systems Pertinent negatives listed in HPI Physical Exam Triage Vital Signs ED Triage Vitals  Enc Vitals Group     BP 02/21/20 2019 109/73     Pulse Rate 02/21/20 2019 (!) 102     Resp 02/21/20 2019 17     Temp 02/21/20 2019 98.4 F (36.9 C)     Temp Source 02/21/20 2019 Oral     SpO2 02/21/20 2019 100 %     Weight --      Height --      Head Circumference --      Peak Flow --      Pain Score 02/21/20 2017 6     Pain Loc --      Pain Edu? --      Excl. in [place]? --    No data found.  Updated Vital Signs BP 109/73 (BP Location: Right Arm)   Pulse (!) 102   Temp 98.4 F (36.9 C) (Oral)   Resp 17   LMP 01/25/2020   SpO2 100%   Visual Acuity Right Eye Distance:   Left Eye Distance:   Bilateral Distance:    Right Eye Near:   Left Eye Near:    Bilateral Near:     Physical Exam Constitutional:      General: She is not in acute distress.    Appearance: She is not toxic-appearing.  HENT:     Nose: Mucosal edema and congestion present.     Mouth/Throat:     Pharynx: Pharyngeal swelling and posterior oropharyngeal erythema present. No uvula swelling.  Cardiovascular:     Rate and Rhythm: Tachycardia present.  Pulmonary:     Effort: Pulmonary effort is normal. No tachypnea or accessory muscle usage.     Breath sounds: No decreased breath sounds, wheezing, rhonchi or rales.    Musculoskeletal:  General: Normal range of motion.  Skin:    General: Skin is warm and dry.  Neurological:     General: No focal deficit present.     Mental Status: She is alert and oriented to person, place, and time.  Psychiatric:        Attention and Perception: Attention normal.        Mood and Affect: Mood normal.     UC Treatments / Results  Labs (all labs ordered are listed, but only abnormal results are displayed) Labs Reviewed - No data to display  EKG   Radiology No results found.  Procedures Procedures (including critical care time)  Medications Ordered in UC Medications - No data to display  Initial Impression / Assessment and Plan / UC Course  I have reviewed the triage vital signs and the nursing notes.  Pertinent labs & imaging results that were available during my care of the patient were reviewed by me and considered in my medical decision making (see chart for details).     1. Acute bronchitis, unspecified organism 2. Mild intermittent asthma, unspecified whether complicated Given symptoms have been present for more than 2 weeks with current prescribed asthma treatment and patient complains of worsening chest tightness and shortness of breath.  Will treat for acute bronchitis complicated by chronic mild intermittent asthma.  Start amoxicillin 875 twice daily x10 days. Prednisone 40 mg once daily with breakfast x5 days for chest inflammation.  Red flags discussed. 3. Pharyngitis, unspecified etiology -Patient is being treated for acute bronchitis with amoxicillin this will cover any underlying bacteria is associated with pharyngitis.    Final Clinical Impressions(s) / UC Diagnoses   Final diagnoses:  Acute bronchitis, unspecified organism  Mild intermittent asthma, unspecified whether complicated  Pharyngitis, unspecified etiology   Discharge Instructions   None    ED Prescriptions    Medication Sig Dispense Auth. Provider    predniSONE (DELTASONE) 20 MG tablet Take 2 tablets (40 mg total) by mouth daily with breakfast for 5 days. 10 tablet Bing Neighbors, FNP   amoxicillin (AMOXIL) 875 MG tablet Take 1 tablet (875 mg total) by mouth 2 (two) times daily. 20 tablet Bing Neighbors, FNP     PDMP not reviewed this encounter.   Bing Neighbors, FNP 02/21/20 2050

## 2020-03-16 DIAGNOSIS — R809 Proteinuria, unspecified: Secondary | ICD-10-CM | POA: Diagnosis not present

## 2020-03-16 DIAGNOSIS — R109 Unspecified abdominal pain: Secondary | ICD-10-CM | POA: Diagnosis not present

## 2020-03-16 DIAGNOSIS — N181 Chronic kidney disease, stage 1: Secondary | ICD-10-CM | POA: Diagnosis not present

## 2020-03-16 DIAGNOSIS — M549 Dorsalgia, unspecified: Secondary | ICD-10-CM | POA: Diagnosis not present

## 2020-03-16 DIAGNOSIS — R319 Hematuria, unspecified: Secondary | ICD-10-CM | POA: Diagnosis not present

## 2020-03-16 DIAGNOSIS — G8929 Other chronic pain: Secondary | ICD-10-CM | POA: Diagnosis not present

## 2020-06-01 DIAGNOSIS — A599 Trichomoniasis, unspecified: Secondary | ICD-10-CM | POA: Diagnosis not present

## 2020-06-01 DIAGNOSIS — N926 Irregular menstruation, unspecified: Secondary | ICD-10-CM | POA: Diagnosis not present

## 2020-06-01 DIAGNOSIS — N898 Other specified noninflammatory disorders of vagina: Secondary | ICD-10-CM | POA: Diagnosis not present

## 2020-06-11 ENCOUNTER — Emergency Department: Admit: 2020-06-11 | Payer: MEDICAID

## 2020-06-11 ENCOUNTER — Inpatient Hospital Stay
Admit: 2020-06-11 | Discharge: 2020-06-11 | Disposition: A | Discharge status: Home / Self Care | Payer: MEDICAID | Attending: Family Medicine

## 2020-06-11 DIAGNOSIS — U071 COVID-19: Secondary | ICD-10-CM

## 2020-06-11 LAB — COVID-19

## 2020-06-11 LAB — HCG URINE, QL
HCG urine, QL: NEGATIVE
Pregnancy Test(Urn): NEGATIVE

## 2020-06-11 LAB — SARS-COV-2

## 2020-06-11 MED ORDER — IPRATROPIUM-ALBUTEROL 2.5 MG-0.5 MG/3 ML NEB SOLUTION
2.5 mg-0.5 mg/3 ml | RESPIRATORY_TRACT | Status: AC
Start: 2020-06-11 — End: 2020-06-11
  Administered 2020-06-11: 21:00:00 via RESPIRATORY_TRACT

## 2020-06-11 MED ORDER — PREDNISONE 20 MG TAB
20 mg | ORAL_TABLET | Freq: Every day | ORAL | 0 refills | Status: AC
Start: 2020-06-11 — End: 2020-06-16

## 2020-06-11 MED FILL — IPRATROPIUM-ALBUTEROL 2.5 MG-0.5 MG/3 ML NEB SOLUTION: 2.5 mg-0.5 mg/3 ml | RESPIRATORY_TRACT | Qty: 3

## 2020-06-11 NOTE — ED Provider Notes (Signed)
ED Provider Notes by Cora Daniels, DO at 06/11/20 1804                Author: Cora Daniels, DO  Service: --  Author Type: Physician       Filed: 06/12/20 1430  Date of Service: 06/11/20 1804  Status: Signed          Editor: Cora Daniels, DO (Physician)               EMERGENCY DEPARTMENT HISTORY AND PHYSICAL EXAM           Date: 06/11/2020   Patient Name: Carolyn Manning        History of Presenting Illness          Chief Complaint       Patient presents with        ?  Flu Like Symptoms        ?  Wheezing           History Provided By:       HPI: Carolyn Manning, is an extremely  pleasant 19 y.o. female presenting  to the ED with a chief complaint of flulike symptoms and wheezing.  She states she has a history of asthma and this feels very similar.  She states she has been achy, fatigued and congested.  She denies chest pains nor shortness of breath.  Occasional  dry cough.  No alleviating or aggravating factors.  Of note triage note states patient is having chest pain however upon questioning if patient has chest pain she denies and  endorses only wheezing.      There are no other complaints, changes, or physical findings at this time.      PCP: None        No current facility-administered medications on file prior to encounter.          No current outpatient medications on file prior to encounter.             Past History        Past Medical History:   No past medical history on file.      Past Surgical History:   No past surgical history on file.      Family History:   No family history on file.      Social History:     Social History          Tobacco Use         ?  Smoking status:  Not on file       Substance Use Topics         ?  Alcohol use:  Not on file         ?  Drug use:  Not on file           Allergies:     Allergies        Allergen  Reactions         ?  Naproxen  Itching                Review of Systems     \\   Review of Systems    Constitutional: Negative for activity change, appetite  change, chills, fatigue and fever.         Nontoxic, well-appearing    HENT: Negative for congestion and sore throat.     Eyes: Negative for photophobia and visual disturbance.    Respiratory: Positive for wheezing. Negative  for cough and shortness of breath.     Cardiovascular: Negative for chest pain, palpitations and leg swelling.    Gastrointestinal: Negative for abdominal pain, diarrhea, nausea and vomiting.    Endocrine: Negative for cold intolerance and heat intolerance.    Musculoskeletal: Negative for gait problem and joint swelling.    Skin: Negative for color change and rash.    Neurological: Negative for dizziness and headaches.            Physical Exam        Physical Exam   Constitutional :        Appearance: She is well-developed.   HENT :       Head: Normocephalic and atraumatic.      Mouth/Throat:      Mouth: Mucous membranes are moist.      Pharynx: Oropharynx is clear.   Eyes:       Conjunctiva/sclera: Conjunctivae normal.      Pupils: Pupils are equal, round, and reactive to light.   Cardiovascular:       Rate and Rhythm: Normal rate and regular rhythm.      Heart sounds: No murmur heard.      Pulmonary:       Effort: No respiratory distress.      Breath sounds: No stridor. No wheezing, rhonchi or rales.      Comments: Occasional expiratory wheeze   Abdominal:      General: There is no distension.      Tenderness: There is no abdominal tenderness. There is no rebound.     Musculoskeletal:      Cervical back: Normal range of motion and neck supple.    Skin:      General: Skin is warm and dry.   Neurological :       General: No focal deficit present.      Mental Status: She is alert and oriented to person, place, and time.    Psychiatric:         Mood and Affect: Mood normal.         Behavior: Behavior normal.               Lab and Diagnostic Study Results        Labs -    No results found for this or any previous visit (from the past 12 hour(s)).      Radiologic Studies -    @lastxrresult @     CT  Results   (Last 48 hours)          None                 CXR Results   (Last 48 hours)                                    06/11/20 1704    XR CHEST PA LAT  Final result            Impression:    Normal chest.                        Narrative:    Chest 2 views. No prior study available.      Heart size is normal. The mediastinum is unremarkable. The lungs are clear      without infiltrate or effusion. The bony thorax is intact.  Medical Decision Making     - I am the first provider for this patient.   - I reviewed the vital signs, available nursing notes, past medical history, past surgical history, family history and social history.   - Initial assessment performed. The patients presenting problems have been discussed, and they are in agreement with the care plan formulated and outlined with them.  I have encouraged them to ask questions as they arise throughout their visit.      Vital Signs-Reviewed the patient's vital signs.   No data found.         ED Course/ Provider Notes (Medical Decision Making):       Patient presented to the emergency department with a chief complaint of wheezing, body aches and congestion.  On exam she is nontoxic and well-appearing.  She is satting 100% on room air.  Vitals within  normal limits.  Occasional expiratory wheeze.  She was given a DuoNeb with resolution of her symptoms.  She states other than body aches and congestion she feels back to normal.  Chest x-ray demonstrates no acute intrathoracic process.  Covid testing  ordered, will contact patient with results.  Prescription for prednisone.  Suspect self-limiting viral URI with mild exacerbation of asthma.                  Carolyn Manning was given a thorough list of signs and symptoms that would warrant an immediate return to the emergency department. Otherwise  Carolyn Manning will follow up with PCP.            Procedures     Medical Decision Makingedical Decision Making   Performed by:  Cora Daniels, DO   Procedures  None            Disposition     Disposition:       Home \\      All of the diagnostic tests were reviewed and questions answered. Diagnosis, care plan and treatment options were discussed.  The patient understands the instructions and will follow up as directed. The patients results have been reviewed with them.   They have been counseled regarding their diagnosis.  The patient verbally convey understanding and agreement of the signs, symptoms, diagnosis, treatment and prognosis and additionally agrees to follow up as recommended with their PCP in 24 - 48 hours.   They also agree with the care-plan and convey that all of their questions have been answered.  I have also put together some discharge instructions for them that include: 1) educational information regarding their diagnosis, 2) how to care for their  diagnosis at home, as well a 3) list of reasons why they would want to return to the ED prior to their follow-up appointment, should their condition change.            DISCHARGE PLAN:      1. Cannot display discharge medications since this patient is not currently admitted.            2.      Follow-up Information               Follow up With  Specialties  Details  Why  Contact Info              Your primary care doctor    Schedule an appointment as soon as possible for a visit  3.  Return to ED if worse          4.      Discharge Medication List as of 06/11/2020  5:26 PM                       Diagnosis        Clinical Impression: \\       1.  Upper respiratory tract infection, unspecified type            Attestations:      Cora Daniels, DO      Please note that this dictation was completed with Dragon, the computer voice recognition software.  Quite often unanticipated grammatical, syntax, homophones, and other interpretive errors are inadvertently  transcribed by the computer software.  Please disregard these errors.  Please excuse any errors that have  escaped final proofreading.  Thank you.

## 2020-06-11 NOTE — ED Notes (Signed)
Patient reports that about a week ago she began feeling sick, states that over the last few weeks she has been feeling short of breath & having sharp pain in her chest.  States that she recently moved out here & does not have any of her inhalers or nebulizers.

## 2020-06-12 NOTE — Progress Notes (Signed)
06/13/20 Attempted patient outreach for COVID follow up call per protocol. Unable to contact patient, not active on MyChart. COVID test negative. Resolving TOC episode. APM

## 2020-06-13 LAB — COVID-19: SARS-CoV-2: DETECTED — AB

## 2020-06-13 LAB — NOVEL CORONAVIRUS (COVID-19): SARS-CoV-2: DETECTED — AB

## 2020-09-06 DIAGNOSIS — N926 Irregular menstruation, unspecified: Secondary | ICD-10-CM | POA: Diagnosis not present

## 2020-09-06 DIAGNOSIS — Z3202 Encounter for pregnancy test, result negative: Secondary | ICD-10-CM | POA: Diagnosis not present

## 2020-09-06 DIAGNOSIS — Z113 Encounter for screening for infections with a predominantly sexual mode of transmission: Secondary | ICD-10-CM | POA: Diagnosis not present

## 2020-09-06 DIAGNOSIS — N920 Excessive and frequent menstruation with regular cycle: Secondary | ICD-10-CM | POA: Diagnosis not present

## 2020-09-06 DIAGNOSIS — N898 Other specified noninflammatory disorders of vagina: Secondary | ICD-10-CM | POA: Diagnosis not present

## 2020-09-20 DIAGNOSIS — N854 Malposition of uterus: Secondary | ICD-10-CM | POA: Diagnosis not present

## 2020-09-20 DIAGNOSIS — R9389 Abnormal findings on diagnostic imaging of other specified body structures: Secondary | ICD-10-CM | POA: Diagnosis not present

## 2020-09-28 DIAGNOSIS — N920 Excessive and frequent menstruation with regular cycle: Secondary | ICD-10-CM | POA: Diagnosis not present

## 2020-12-10 DIAGNOSIS — N809 Endometriosis, unspecified: Secondary | ICD-10-CM | POA: Diagnosis not present

## 2020-12-10 DIAGNOSIS — N979 Female infertility, unspecified: Secondary | ICD-10-CM | POA: Diagnosis not present

## 2020-12-17 DIAGNOSIS — N970 Female infertility associated with anovulation: Secondary | ICD-10-CM | POA: Diagnosis not present

## 2020-12-17 DIAGNOSIS — N920 Excessive and frequent menstruation with regular cycle: Secondary | ICD-10-CM | POA: Diagnosis not present

## 2020-12-26 DIAGNOSIS — N920 Excessive and frequent menstruation with regular cycle: Secondary | ICD-10-CM | POA: Diagnosis not present

## 2020-12-26 DIAGNOSIS — N83202 Unspecified ovarian cyst, left side: Secondary | ICD-10-CM | POA: Diagnosis not present

## 2021-01-17 DIAGNOSIS — J029 Acute pharyngitis, unspecified: Secondary | ICD-10-CM | POA: Diagnosis not present

## 2021-02-28 DIAGNOSIS — N898 Other specified noninflammatory disorders of vagina: Secondary | ICD-10-CM | POA: Diagnosis not present

## 2021-02-28 DIAGNOSIS — Z113 Encounter for screening for infections with a predominantly sexual mode of transmission: Secondary | ICD-10-CM | POA: Diagnosis not present

## 2021-03-18 ENCOUNTER — Encounter (HOSPITAL_COMMUNITY): Payer: Self-pay | Admitting: *Deleted

## 2021-03-18 ENCOUNTER — Emergency Department (HOSPITAL_COMMUNITY)
Admission: EM | Admit: 2021-03-18 | Discharge: 2021-03-18 | Disposition: A | Discharge status: Home / Self Care | Payer: Medicaid Other | Attending: Emergency Medicine | Admitting: Emergency Medicine

## 2021-03-18 ENCOUNTER — Emergency Department (HOSPITAL_COMMUNITY): Payer: Medicaid Other

## 2021-03-18 DIAGNOSIS — O3481 Maternal care for other abnormalities of pelvic organs, first trimester: Secondary | ICD-10-CM | POA: Diagnosis not present

## 2021-03-18 DIAGNOSIS — Z3A00 Weeks of gestation of pregnancy not specified: Secondary | ICD-10-CM | POA: Insufficient documentation

## 2021-03-18 DIAGNOSIS — J45909 Unspecified asthma, uncomplicated: Secondary | ICD-10-CM | POA: Insufficient documentation

## 2021-03-18 DIAGNOSIS — O26899 Other specified pregnancy related conditions, unspecified trimester: Secondary | ICD-10-CM | POA: Insufficient documentation

## 2021-03-18 DIAGNOSIS — Z7722 Contact with and (suspected) exposure to environmental tobacco smoke (acute) (chronic): Secondary | ICD-10-CM | POA: Diagnosis not present

## 2021-03-18 DIAGNOSIS — R109 Unspecified abdominal pain: Secondary | ICD-10-CM

## 2021-03-18 DIAGNOSIS — Z3A01 Less than 8 weeks gestation of pregnancy: Secondary | ICD-10-CM | POA: Diagnosis not present

## 2021-03-18 DIAGNOSIS — O26891 Other specified pregnancy related conditions, first trimester: Secondary | ICD-10-CM | POA: Diagnosis not present

## 2021-03-18 DIAGNOSIS — O3680X0 Pregnancy with inconclusive fetal viability, not applicable or unspecified: Secondary | ICD-10-CM

## 2021-03-18 DIAGNOSIS — Z7951 Long term (current) use of inhaled steroids: Secondary | ICD-10-CM | POA: Insufficient documentation

## 2021-03-18 DIAGNOSIS — N83201 Unspecified ovarian cyst, right side: Secondary | ICD-10-CM | POA: Diagnosis not present

## 2021-03-18 LAB — URINALYSIS, ROUTINE W REFLEX MICROSCOPIC
Bilirubin Urine: NEGATIVE
Glucose, UA: NEGATIVE mg/dL
Hgb urine dipstick: NEGATIVE
Ketones, ur: 5 mg/dL — AB
Leukocytes,Ua: NEGATIVE
Nitrite: NEGATIVE
Protein, ur: NEGATIVE mg/dL
Specific Gravity, Urine: 1.008 (ref 1.005–1.030)
pH: 7 (ref 5.0–8.0)

## 2021-03-18 LAB — CBC
HCT: 37.9 % (ref 36.0–46.0)
Hemoglobin: 12.5 g/dL (ref 12.0–15.0)
MCH: 26.9 pg (ref 26.0–34.0)
MCHC: 33 g/dL (ref 30.0–36.0)
MCV: 81.5 fL (ref 80.0–100.0)
Platelets: 275 10*3/uL (ref 150–400)
RBC: 4.65 MIL/uL (ref 3.87–5.11)
RDW: 12.4 % (ref 11.5–15.5)
WBC: 4 10*3/uL (ref 4.0–10.5)
nRBC: 0 % (ref 0.0–0.2)

## 2021-03-18 LAB — COMPREHENSIVE METABOLIC PANEL
ALT: 16 U/L (ref 0–44)
AST: 16 U/L (ref 15–41)
Albumin: 3.6 g/dL (ref 3.5–5.0)
Alkaline Phosphatase: 72 U/L (ref 38–126)
Anion gap: 6 (ref 5–15)
BUN: 8 mg/dL (ref 6–20)
CO2: 23 mmol/L (ref 22–32)
Calcium: 8.7 mg/dL — ABNORMAL LOW (ref 8.9–10.3)
Chloride: 105 mmol/L (ref 98–111)
Creatinine, Ser: 0.54 mg/dL (ref 0.44–1.00)
GFR, Estimated: >60 mL/min (ref >60)
Glucose, Bld: 82 mg/dL (ref 70–99)
Potassium: 3.7 mmol/L (ref 3.5–5.1)
Sodium: 134 mmol/L — ABNORMAL LOW (ref 135–145)
Total Bilirubin: 0.7 mg/dL (ref 0.3–1.2)
Total Protein: 6.8 g/dL (ref 6.5–8.1)

## 2021-03-18 LAB — I-STAT BETA HCG BLOOD, ED (MC, WL, AP ONLY): I-stat hCG, quantitative: 893.6 m[IU]/mL — ABNORMAL HIGH (ref <5)

## 2021-03-18 LAB — LIPASE, BLOOD: Lipase: 31 U/L (ref 11–51)

## 2021-03-18 NOTE — ED Provider Notes (Signed)
[facility]-EMERGENCY DEPT Provider Note   CSN: [id number] Arrival date & time: 03/18/21  1040     History Chief Complaint  Patient presents with  . Abdominal Pain  . Rash    Hannah Mccarthy is a 20 y.o. female.  HPI     Abdominal pain lower abdomen for 2 weeks Middle and left side, sharp pain, comes and goes Worse with walking around, then hits at different times when asleep Pain also in back, feels like the same, going down right leg although pain more on left side, whole back hurts.  Feel like whole body feels numb. Nausea, no vomiting Lightheadedness began last week No fever, no dysuria, hematuria No vaginal bleeding, light discharge, just received some pills for it 12 days ago No diarrhea or constipation Hx of endometriosis and irregular periods, haven't had one in a while No loss of control of bowel or bladder Lupus on both sides of family  Rash on face, chin, itching, burning,   Past Medical History:  Diagnosis Date  . Asthma   . Endometriosis   . Renal disorder     Patient Active Problem List   Diagnosis Date Noted  . Proteinuria 08/04/2018    Past Surgical History:  Procedure Laterality Date  . ABDOMINAL SURGERY    . MOUTH SURGERY       OB History    Gravida  1   Para      Term      Preterm      AB      Living        SAB      IAB      Ectopic      Multiple      Live Births              Family History  Problem Relation Age of Onset  . Heart disease Mother   . Diabetes Father     Social History   Tobacco Use  . Smoking status: Passive Smoke Exposure - Never Smoker  . Smokeless tobacco: Never Used  Vaping Use  . Vaping Use: Never used  Substance Use Topics  . Alcohol use: No  . Drug use: Not Currently    Home Medications Prior to Admission medications   Medication Sig Start Date End Date Taking? Authorizing Provider  acetaminophen (TYLENOL) 500 MG tablet Take 1 tablet (500 mg total) by mouth  every 6 (six) hours as needed. 07/29/19   Janace Aris, NP  albuterol (PROVENTIL HFA;VENTOLIN HFA) 108 (90 Base) MCG/ACT inhaler Inhale 2 puffs into the lungs every 4 (four) hours as needed for wheezing or shortness of breath. 05/25/18   Maxwell Caul, PA-C  amoxicillin (AMOXIL) 875 MG tablet Take 1 tablet (875 mg total) by mouth 2 (two) times daily. 02/21/20   Bing Neighbors, FNP  beclomethasone (QVAR) 40 MCG/ACT inhaler Inhale 1 puff into the lungs 2 (two) times daily. 05/25/18   Maxwell Caul, PA-C  budesonide-formoterol (SYMBICORT) 160-4.5 MCG/ACT inhaler Inhale 2 puffs into the lungs 2 (two) times daily.    [provider]  budesonide-formoterol (SYMBICORT) 160-4.5 MCG/ACT inhaler Inhale into the lungs. 02/09/20   [provider]  cetirizine (ZYRTEC) 10 MG tablet Take 10 mg by mouth daily.    [provider]  DULoxetine (CYMBALTA) 30 MG capsule Take 30 mg by mouth daily as needed (pain).     [provider]  ergocalciferol (VITAMIN D2) 1.25 MG (50000 UT) capsule  Take by mouth. 01/27/20   [provider]  estradiol (ESTRACE) 2 MG tablet Take by mouth. 02/06/20   [provider]  fexofenadine (ALLEGRA) 180 MG tablet Take 180 mg by mouth daily. 01/27/18   [provider]  fluconazole (DIFLUCAN) 150 MG tablet TAKE ONE TABLET TODAY, AND THEN 1 TABLET ON DAY 4. 12/02/19   [provider]  lidocaine (XYLOCAINE) 2 % jelly Apply 1 application topically as needed. 01/05/20   Darr, Gerilyn Pilgrim, PA-C  lidocaine (XYLOCAINE) 2 % solution Use about 20 min before intercourse at the vaginal opening 02/06/20   [provider]  LINZESS 72 MCG capsule Take 72 mcg by mouth daily. 11/11/19   [provider]  mesalamine (CANASA) 1000 MG suppository Place 1 suppository (1,000 mg total) rectally at bedtime. 01/05/20   Darr, Gerilyn Pilgrim, PA-C  metroNIDAZOLE (FLAGYL) 500 MG tablet Take 1 pill by mouth Twice a day 02/03/20   [provider]   montelukast (SINGULAIR) 10 MG tablet Take 10 mg by mouth at bedtime.    [provider]  nabumetone (RELAFEN) 500 MG tablet Take 500 mg by mouth 2 (two) times daily as needed for moderate pain.     [provider]  pantoprazole (PROTONIX) 40 MG tablet Take 40 mg by mouth daily.    [provider]  QUEtiapine (SEROQUEL) 25 MG tablet Take 25 mg by mouth daily as needed (sleep).  12/25/16   [provider]  Vitamin D, Ergocalciferol, (DRISDOL) 1.25 MG (50000 UNIT) CAPS capsule Take 50,000 Units by mouth once a week. 08/01/19   [provider]    Allergies    Escitalopram and Naproxen  Review of Systems   Review of Systems  Constitutional: Negative for fever.  Respiratory: Negative for cough and shortness of breath.   Cardiovascular: Negative for chest pain.  Gastrointestinal: Positive for abdominal pain and nausea. Negative for constipation, diarrhea and vomiting.  Genitourinary: Positive for vaginal discharge. Negative for dysuria and vaginal bleeding.  Musculoskeletal: Positive for back pain.  Skin: Positive for rash.  Neurological: Positive for light-headedness.    Physical Exam Updated Vital Signs BP 117/65   Pulse 85   Temp 98.1 F (36.7 C) (Oral)   Resp 18   Ht 5\' 5"  (1.651 m)   Wt 63.5 kg   SpO2 100%   BMI 23.30 kg/m   Physical Exam Vitals and nursing note reviewed.  Constitutional:      General: She is not in acute distress.    Appearance: She is well-developed. She is not diaphoretic.  HENT:     Head: Normocephalic and atraumatic.  Eyes:     Conjunctiva/sclera: Conjunctivae normal.  Cardiovascular:     Rate and Rhythm: Normal rate and regular rhythm.     Heart sounds: Normal heart sounds. No murmur heard. No friction rub. No gallop.   Pulmonary:     Effort: Pulmonary effort is normal. No respiratory distress.     Breath sounds: Normal breath sounds. No wheezing or rales.  Abdominal:     General: There is no  distension.     Palpations: Abdomen is soft.     Tenderness: There is abdominal tenderness in the suprapubic area and left lower quadrant. There is no guarding.  Musculoskeletal:        General: No tenderness.     Cervical back: Normal range of motion.  Skin:    General: Skin is warm and dry.     Findings: No erythema or rash.  Neurological:     Mental Status: She is alert and oriented to person, place, and time.     ED Results / Procedures / Treatments   Labs (all labs ordered are listed, but only abnormal results are displayed) Labs Reviewed  COMPREHENSIVE METABOLIC PANEL - Abnormal; Notable for the following components:      Result Value   Sodium 134 (*)    Calcium 8.7 (*)    All other components within normal limits  URINALYSIS, ROUTINE W REFLEX MICROSCOPIC - Abnormal; Notable for the following components:   Color, Urine STRAW (*)    Ketones, ur 5 (*)    All other components within normal limits  I-STAT BETA HCG BLOOD, ED ([facility], [facility], [facility] ONLY) - Abnormal; Notable for the following components:   I-stat hCG, quantitative 893.6 (*)    All other components within normal limits  LIPASE, BLOOD  CBC    EKG None  Radiology US OB LESS THAN 14 WEEKS WITH OB TRANSVAGINAL  Result Date: 03/18/2021 CLINICAL DATA:  Abdominal pain for 2 weeks. Unsure of dates. Quantitative beta hCG 893. EXAM: OBSTETRIC <14 WK [place] AND TRANSVAGINAL OB US TECHNIQUE: Both transabdominal and transvaginal ultrasound examinations were performed for complete evaluation of the gestation as well as the maternal uterus, adnexal regions, and pelvic cul-de-sac. Transvaginal technique was performed to assess early pregnancy. COMPARISON:  None. FINDINGS: Intrauterine gestational sac: None Yolk sac:  Not Visualized. Embryo:  Not Visualized. Cardiac Activity: Not Visualized. Subchorionic hemorrhage:  None visualized. Maternal uterus/adnexae: Right ovary: There is a well-circumscribed, anechoic structure measuring 3.8 x 6.0 x  3.3 cm. There is no internal septation or mural nodule. There is some echogenic debris layering within the dependent portion of this structure likely reflecting resolving hemorrhage. Left ovary: Normal Other :None Free fluid:  Trace IMPRESSION: 1. No intrauterine gestational sac, yolk sac, or fetal pole identified. Differential considerations include intrauterine pregnancy too early to be sonographically visualized, missed abortion, or ectopic pregnancy. Followup ultrasound is recommended in 10-14 days for further evaluation. 2. Large right ovary cyst with long axis measuring up to 6 cm. Although mostly anechoic without septation or mural nodules, there is some resolving debris layering dependently. Recommend follow-up US in 3-6 months. Note: This recommendation does not apply to premenarchal patients or to those with increased risk (genetic, family history, elevated tumor markers or other high-risk factors) of ovarian cancer. Reference: Radiology 2019 Nov; 293(2):359-371. Electronically Signed   By: Signa Kell M.D.   On: 03/18/2021 14:42    Procedures Procedures   Medications Ordered in ED Medications - No data to display  ED Course  I have reviewed the triage vital signs and the nursing notes.  Pertinent labs & imaging results that were available during my care of the patient were reviewed by me and considered in my medical decision making (see chart for details).    MDM Rules/Calculators/A&P                          20yo female with history of asthma and endometriosis presents with concern for abdominal pain, nausea.  Also reports rash-family hx of lupus-not classic malar rash, discussed may follow up with PCP regarding this . UA not consistent with infection. No anemia, no significant electrolyte abnormalities, no sign of pancreatitis. HCG 893.   [place] ordered shows no intrauterine pregnancy, no signs of ruptured ectopic. Does show right ovarian cyst for which [place] recommended in 3-6 months.  DDx continues to include early intrauterine pregnancy or ectopic pregnancy.  Hemodynamically stable, requesting discharge.  Recommend OBGYN follow up, 48hr hCG 10-14 day US, return for worsening abdominal pain, syncope, lightheadedness. Discussed with pt and mother. Patient discharged in stable condition with understanding of reasons to return.    Final Clinical Impression(s) / ED Diagnoses Final diagnoses:  Abdominal pain  Right ovarian cyst  Pregnancy of unknown anatomic location    Rx / DC Orders ED Discharge Orders    None       Alvira MondaySchlossman, Aneth Schlagel, MD 03/19/21 2359

## 2021-03-18 NOTE — ED Notes (Signed)
Ultrasound in pt room. Will update vitals when ultrasound finished.

## 2021-03-18 NOTE — ED Triage Notes (Signed)
Pt complains of abdominal pain radiating down right leg, facial rash x 2 weeks. Pt is concerned she has lupus.

## 2021-03-18 NOTE — ED Notes (Signed)
Brought pt warm blanket, provider at bedside

## 2021-03-19 DIAGNOSIS — R319 Hematuria, unspecified: Secondary | ICD-10-CM | POA: Diagnosis not present

## 2021-03-19 DIAGNOSIS — Z3490 Encounter for supervision of normal pregnancy, unspecified, unspecified trimester: Secondary | ICD-10-CM | POA: Diagnosis not present

## 2021-03-19 DIAGNOSIS — G8929 Other chronic pain: Secondary | ICD-10-CM | POA: Diagnosis not present

## 2021-03-19 DIAGNOSIS — R809 Proteinuria, unspecified: Secondary | ICD-10-CM | POA: Diagnosis not present

## 2021-03-19 DIAGNOSIS — N181 Chronic kidney disease, stage 1: Secondary | ICD-10-CM | POA: Diagnosis not present

## 2021-03-19 DIAGNOSIS — M549 Dorsalgia, unspecified: Secondary | ICD-10-CM | POA: Diagnosis not present

## 2021-03-19 DIAGNOSIS — R109 Unspecified abdominal pain: Secondary | ICD-10-CM | POA: Diagnosis not present

## 2021-03-20 ENCOUNTER — Inpatient Hospital Stay (HOSPITAL_COMMUNITY): Payer: Medicaid Other

## 2021-03-20 ENCOUNTER — Inpatient Hospital Stay (HOSPITAL_COMMUNITY)
Admission: AD | Admit: 2021-03-20 | Discharge: 2021-03-20 | Disposition: A | Discharge status: Home / Self Care | Payer: Medicaid Other | Attending: Obstetrics & Gynecology | Admitting: Obstetrics & Gynecology

## 2021-03-20 ENCOUNTER — Encounter (HOSPITAL_COMMUNITY): Payer: Self-pay | Admitting: Obstetrics & Gynecology

## 2021-03-20 ENCOUNTER — Other Ambulatory Visit: Payer: Self-pay

## 2021-03-20 ENCOUNTER — Ambulatory Visit (INDEPENDENT_AMBULATORY_CARE_PROVIDER_SITE_OTHER): Payer: Medicaid Other | Admitting: Lactation Services

## 2021-03-20 ENCOUNTER — Encounter: Payer: Self-pay | Admitting: Lactation Services

## 2021-03-20 VITALS — Ht 65.5 in | Wt 137.6 lb

## 2021-03-20 DIAGNOSIS — Z79899 Other long term (current) drug therapy: Secondary | ICD-10-CM | POA: Insufficient documentation

## 2021-03-20 DIAGNOSIS — O3680X0 Pregnancy with inconclusive fetal viability, not applicable or unspecified: Secondary | ICD-10-CM

## 2021-03-20 DIAGNOSIS — Z7951 Long term (current) use of inhaled steroids: Secondary | ICD-10-CM | POA: Insufficient documentation

## 2021-03-20 DIAGNOSIS — Z3A01 Less than 8 weeks gestation of pregnancy: Secondary | ICD-10-CM | POA: Insufficient documentation

## 2021-03-20 DIAGNOSIS — R109 Unspecified abdominal pain: Secondary | ICD-10-CM | POA: Insufficient documentation

## 2021-03-20 DIAGNOSIS — N83201 Unspecified ovarian cyst, right side: Secondary | ICD-10-CM | POA: Diagnosis not present

## 2021-03-20 DIAGNOSIS — O26899 Other specified pregnancy related conditions, unspecified trimester: Secondary | ICD-10-CM

## 2021-03-20 DIAGNOSIS — O26891 Other specified pregnancy related conditions, first trimester: Secondary | ICD-10-CM | POA: Diagnosis not present

## 2021-03-20 LAB — BETA HCG QUANT (REF LAB): hCG Quant: 1977 m[IU]/mL

## 2021-03-20 MED ORDER — ACETAMINOPHEN 500 MG PO TABS: 1000.0000 mg | ORAL_TABLET | Freq: Four times a day (QID) | ORAL | 0 refills | Status: DC | PRN

## 2021-03-20 NOTE — Patient Instructions (Signed)
Please go to the Maternity Assessment Unit at the [facility] (Entrance C). [address]

## 2021-03-20 NOTE — MAU Note (Signed)
Presents with c/o intermittent pain in  left lower abdomen that 2 weeks ago. Also states has constant pain in lower back. Denies VB.  LMP 02/17/2021.

## 2021-03-20 NOTE — Discharge Instructions (Signed)
Abdominal Pain During Pregnancy Belly (abdominal) pain is common during pregnancy. There are many possible causes. Some causes are more serious than others. Sometimes the cause is not known. Always tell your doctor if you have belly pain. Follow these instructions at home:  Do not have sex or put anything in your vagina until your pain goes away completely.  Get plenty of rest until your pain gets better.  Drink enough fluid to keep your pee (urine) pale yellow.  Take over-the-counter and prescription medicines only as told by your doctor.  Keep all follow-up visits.   Contact a doctor if:  You keep having pain after resting.  Your pain gets worse after resting.  You have lower belly pain that: ? Comes and goes at regular times. ? Spreads to your back. ? Feels like menstrual cramps.  You have pain or burning when you pee (urinate). Get help right away if:  You have a fever or chills.  You feel like it is hard to breathe.  You have bleeding from your vagina.  You are leaking fluid or tissue from your vagina.  You vomit for more than 24 hours.  You have watery poop (diarrhea) for more than 24 hours.  Your baby is moving less than usual.  You feel very weak or faint.  You have very bad pain in your upper belly. Summary  Belly pain is common during pregnancy. There are many possible causes.  If you have belly pain during pregnancy, tell your doctor right away.  Keep all follow-up visits. This information is not intended to replace advice given to you by your health care provider. Make sure you discuss any questions you have with your health care provider. Document Revised: 07/03/2020 Document Reviewed: 07/03/2020 Elsevier Patient Education  2021 Elsevier Inc.  

## 2021-03-20 NOTE — MAU Provider Note (Signed)
History     CSN: [id number]  Arrival date and time: 03/20/21 1305   Event Date/Time   First Provider Initiated Contact with Patient 03/20/21 1537      Chief Complaint  Patient presents with  . Abdominal Pain   HPI   [name] is a 20 y.o. female G2P0 @ [redacted]w[redacted]d with a history of endometriosis here with abdominal pain. She was seen in MAU 2 days ago for pain and followed up in the office this morning for an HCG level.  She was sent from the office to MAU for further workup d/t complaints of abdominal pain. The pain is located in the LLQ. Nothing makes the pain improved. The pain comes and goes. Nothing makes the pain worse. She took (1) 500 mg dose of tylenol yesterday which did not help. She has never had this pain before. No bleeding.  No nausea, vomiting, or fever.   She is present today with her mom.   OB History    Gravida  2   Para      Term      Preterm      AB      Living        SAB      IAB      Ectopic      Multiple      Live Births              Past Medical History:  Diagnosis Date  . Asthma   . Endometriosis   . Renal disorder     Past Surgical History:  Procedure Laterality Date  . ABDOMINAL SURGERY    . MOUTH SURGERY      Family History  Problem Relation Age of Onset  . Heart disease Mother   . Diabetes Father     Social History   Tobacco Use  . Smoking status: Passive Smoke Exposure - Never Smoker  . Smokeless tobacco: Never Used  Vaping Use  . Vaping Use: Never used  Substance Use Topics  . Alcohol use: No  . Drug use: Not Currently    Allergies:  Allergies  Allergen Reactions  . Escitalopram Itching and Nausea Only  . Naproxen Itching, Other (See Comments) and Anxiety    Heaving menstrual bleeding    Medications Prior to Admission  Medication Sig Dispense Refill Last Dose  . acetaminophen (TYLENOL) 500 MG tablet Take 1 tablet (500 mg total) by mouth every 6 (six) hours as needed. 30 tablet 0   . albuterol  (PROVENTIL HFA;VENTOLIN HFA) 108 (90 Base) MCG/ACT inhaler Inhale 2 puffs into the lungs every 4 (four) hours as needed for wheezing or shortness of breath. 1 Inhaler 2   . amoxicillin (AMOXIL) 875 MG tablet Take 1 tablet (875 mg total) by mouth 2 (two) times daily. 20 tablet 0   . beclomethasone (QVAR) 40 MCG/ACT inhaler Inhale 1 puff into the lungs 2 (two) times daily. 1 Inhaler 0   . budesonide-formoterol (SYMBICORT) 160-4.5 MCG/ACT inhaler Inhale 2 puffs into the lungs 2 (two) times daily.     . budesonide-formoterol (SYMBICORT) 160-4.5 MCG/ACT inhaler Inhale into the lungs.     . cetirizine (ZYRTEC) 10 MG tablet Take 10 mg by mouth daily.     . DULoxetine (CYMBALTA) 30 MG capsule Take 30 mg by mouth daily as needed (pain).      Marland Kitchen ergocalciferol (VITAMIN D2) 1.25 MG (50000 UT) capsule Take by mouth.     . estradiol (ESTRACE) 2 MG  tablet Take by mouth.     . fexofenadine (ALLEGRA) 180 MG tablet Take 180 mg by mouth daily.     . fluconazole (DIFLUCAN) 150 MG tablet TAKE ONE TABLET TODAY, AND THEN 1 TABLET ON DAY 4.     . lidocaine (XYLOCAINE) 2 % jelly Apply 1 application topically as needed. 30 mL 0   . lidocaine (XYLOCAINE) 2 % solution Use about 20 min before intercourse at the vaginal opening     . LINZESS 72 MCG capsule Take 72 mcg by mouth daily.     . mesalamine (CANASA) 1000 MG suppository Place 1 suppository (1,000 mg total) rectally at bedtime. 30 suppository 12   . metroNIDAZOLE (FLAGYL) 500 MG tablet Take 1 pill by mouth Twice a day     . montelukast (SINGULAIR) 10 MG tablet Take 10 mg by mouth at bedtime.     . nabumetone (RELAFEN) 500 MG tablet Take 500 mg by mouth 2 (two) times daily as needed for moderate pain.      . pantoprazole (PROTONIX) 40 MG tablet Take 40 mg by mouth daily.     . QUEtiapine (SEROQUEL) 25 MG tablet Take 25 mg by mouth daily as needed (sleep).      . Vitamin D, Ergocalciferol, (DRISDOL) 1.25 MG (50000 UNIT) CAPS capsule Take 50,000 Units by mouth once a  week.       Review of Systems  Constitutional: Negative for fever.  Gastrointestinal: Positive for abdominal pain. Negative for nausea and vomiting.  Genitourinary: Negative for vaginal bleeding.   Physical Exam   Blood pressure 122/63, pulse 77, temperature 98.3 F (36.8 C), temperature source Oral, resp. rate 18, height 5' 5.5" (1.664 m), weight 62.6 kg, last menstrual period 02/17/2021, SpO2 100 %.  Physical Exam Constitutional:      General: She is not in acute distress.    Appearance: She is well-developed. She is not ill-appearing, toxic-appearing or diaphoretic.  HENT:     Head: Normocephalic.  Abdominal:     Palpations: Abdomen is soft.     Tenderness: There is no abdominal tenderness. There is no guarding or rebound.  Skin:    General: Skin is warm.  Neurological:     Mental Status: She is alert and oriented to person, place, and time.    Results for orders placed or performed in visit on 03/20/21 (from the past 48 hour(s))  Beta hCG quant (ref lab)     Status: None   Collection Time: 03/20/21 12:24 PM  Result Value Ref Range   hCG Quant 1,977 mIU/mL    Comment:                      Female (Non-pregnant)    0 -     5                             (Postmenopausal)  0 -     8                      Female (Pregnant)                      Weeks of Gestation                              3  6 -    71                              4               10 -   750                              5              217 -  7138                              6              158 - 31795                              7             3697 -[id number]                              8            32065 -149571                              9            63803 -151410                             10            46509 -[id number]                             12            27832 -[id number]                             14            13950 - 62530                             15            12039 - 70971                              16             9040 - [id number]                             17             8175 - 55868                             18             8099 - 58176 Roche E CLIA methodology    US OB Transvaginal  Result Date: 03/20/2021 CLINICAL DATA:  Abdominal pain  in early pregnancy EXAM: TRANSVAGINAL OB ULTRASOUND TECHNIQUE: Transvaginal ultrasound was performed for complete evaluation of the gestation as well as the maternal uterus, adnexal regions, and pelvic cul-de-sac. COMPARISON:  03/18/2021 FINDINGS: Intrauterine gestational sac: Single Yolk sac:  Not Visualized. Embryo:  Not Visualized. Cardiac Activity: Not Visualized. MSD: 4.0 mm   5 w   1 d Subchorionic hemorrhage:  None visualized. Maternal uterus/adnexae: Right ovary: Right ovary cyst is identified. On today's exam this measures 4.9 x 4.7 x 3.5 cm (volume = 42 cm^3). There is no internal septation or mural nodularity. Layering debris is no longer visualized. Previously this measured 3.8 x 6.0 x 3.3 cm (volume = 39 cm^3) Left ovary: Normal Other :None Free fluid:  Trace free fluid noted. IMPRESSION: 1. Probable early intrauterine gestational sac, but no yolk sac, fetal pole, or cardiac activity yet visualized. Recommend follow-up quantitative B-HCG levels and follow-up US in 14 days to confirm and assess viability. This recommendation follows SRU consensus guidelines: Diagnostic Criteria for Nonviable Pregnancy Early in the First Trimester. Malva Limes Med 2013; 245:8099-83. 2. No significant change in the appearance of simple appearing, anechoic cyst within the right ovary which has a maximum length of 4.9 cm on today's exam. No follow up imaging recommended. Note: This recommendation does not apply to premenarchal patients or to those with increased risk (genetic, family history, elevated tumor markers or other high-risk factors) of ovarian cancer. Reference: Radiology 2019 Nov; 293(2):359-371. Electronically Signed   By: Signa Kell M.D.   On:  03/20/2021 14:52    MAU Course  Procedures  None   MDM  Hcg 5/16: 893.6 Hcg 5/18: 19777 Reviewed appropriate rising quants and reassuring [place] with patient and mother.  Will continue serial quants given pregnancy of unknown diagnosis.   Assessment and Plan   A:  1. Pregnancy of unknown anatomic location   2. Abdominal pain in pregnancy     P:  Discharge home with strict return precautions Ectopic precautions. Pelvic rest Ok to use extra strength tylenol as directed on the bottle Return to MAU for stat serial quant on Friday 5/20 after 2 pm Return to MAU if abdominal pain worsens   Geniya Fulgham, Harolyn Rutherford, NP 03/20/2021 3:58 PM

## 2021-03-20 NOTE — Progress Notes (Signed)
Patient with + UPT on Monday. She went to ED at [facility] and was asked to follow up for stat Hcg today. She reports she called her OB and was informed she could not be seen until she is 7-[redacted] weeks pregnant, therefore followed up here in the office. She is having pain for 2 weeks in the center abdomen and to the left. Pain is mainly on the left side and patient stated "I cannot take this pain". The pain is sharp and shoots towards her back. The pain to the abdomen (7/10)  in intermittent and present now, back pain is constant. She has taken Tylenol as needed and it does not help. She has kidney disease also so advised not to take Tylenol around the clock.   She has not had any bleeding. LMP 4/17 with EDD 11/24/2021 . [place] in ED did not reveal location of pregnancy.   Spoke with Edd Arbour, CNM who advised patient needs to go to MAU for evaluation. Patient given address for MAU. Called report to [place], Charity fundraiser in MAU. Patient was going home to pick up her mother and then go to the hospital.  Patient was informed that her mother can go in with her in MAU.   Reviewed with patient that [employer] is available for food insecurity and she declined at this time but aware service is available.

## 2021-03-20 NOTE — Progress Notes (Signed)
Patient was assessed and managed by nursing staff during this encounter. I have reviewed the chart and agree with the documentation and plan. Message sent to MAU providers as patient was arriving to MAU.  Bernerd Limbo, CNM 03/20/2021 5:21 PM

## 2021-03-22 ENCOUNTER — Inpatient Hospital Stay (HOSPITAL_COMMUNITY)
Admission: AD | Admit: 2021-03-22 | Discharge: 2021-03-22 | Discharge status: Against Medical Advice | Payer: Medicaid Other | Attending: Obstetrics & Gynecology | Admitting: Obstetrics & Gynecology

## 2021-03-22 ENCOUNTER — Other Ambulatory Visit: Payer: Self-pay

## 2021-03-22 DIAGNOSIS — Z5321 Procedure and treatment not carried out due to patient leaving prior to being seen by health care provider: Secondary | ICD-10-CM | POA: Diagnosis not present

## 2021-03-22 LAB — HCG, QUANTITATIVE, PREGNANCY: hCG, Beta Chain, Quant, S: 5475 m[IU]/mL — ABNORMAL HIGH (ref <5)

## 2021-03-22 NOTE — MAU Note (Addendum)
Pt states she is unable to wait any longer, needs to pick up her nephew from school. States she will try to call later for results. AMA form signed. Venia Carbon NP made aware.

## 2021-03-22 NOTE — MAU Note (Signed)
Hannah Mccarthy is a 20 y.o. at [redacted]w[redacted]d here in MAU reporting: here for follow up hcg. Having some pain but no bleeding.  Onset of complaint: ongoing  Pain score: 6/10  Vitals:   03/22/21 1634  BP: 117/76  Pulse: 76  Resp: 16  Temp: 98.5 F (36.9 C)  SpO2: 100%     Lab orders placed from triage: hcg

## 2021-04-04 ENCOUNTER — Other Ambulatory Visit: Payer: Self-pay

## 2021-04-04 ENCOUNTER — Ambulatory Visit
Admission: RE | Admit: 2021-04-04 | Discharge: 2021-04-04 | Disposition: A | Discharge status: Home / Self Care | Payer: Medicaid Other | Source: Ambulatory Visit | Attending: Obstetrics and Gynecology | Admitting: Obstetrics and Gynecology

## 2021-04-04 ENCOUNTER — Ambulatory Visit (INDEPENDENT_AMBULATORY_CARE_PROVIDER_SITE_OTHER): Payer: Medicaid Other

## 2021-04-04 DIAGNOSIS — R109 Unspecified abdominal pain: Secondary | ICD-10-CM | POA: Diagnosis not present

## 2021-04-04 DIAGNOSIS — Z3A01 Less than 8 weeks gestation of pregnancy: Secondary | ICD-10-CM | POA: Diagnosis not present

## 2021-04-04 DIAGNOSIS — O26891 Other specified pregnancy related conditions, first trimester: Secondary | ICD-10-CM | POA: Diagnosis not present

## 2021-04-04 DIAGNOSIS — Z712 Person consulting for explanation of examination or test findings: Secondary | ICD-10-CM

## 2021-04-04 DIAGNOSIS — O3481 Maternal care for other abnormalities of pelvic organs, first trimester: Secondary | ICD-10-CM | POA: Diagnosis not present

## 2021-04-04 DIAGNOSIS — O3680X0 Pregnancy with inconclusive fetal viability, not applicable or unspecified: Secondary | ICD-10-CM | POA: Diagnosis not present

## 2021-04-04 DIAGNOSIS — N83291 Other ovarian cyst, right side: Secondary | ICD-10-CM | POA: Diagnosis not present

## 2021-04-04 DIAGNOSIS — O26899 Other specified pregnancy related conditions, unspecified trimester: Secondary | ICD-10-CM

## 2021-04-04 NOTE — Progress Notes (Signed)
Spoke with pt. Pt given results per Dr Vergie Living. Pt advised to make new OB intake appt with New OB appt for first visit. Pt advised to go back to MAU with severe abd pain, nausea, vomiting, fever. Pt verbalized understanding and agreeable to plan of care. Mother with pt today. Pt given [place] pictures printed from MFM.   Judeth Cornfield., RN

## 2021-04-05 NOTE — Progress Notes (Signed)
Patient was assessed and managed by nursing staff during this encounter. I have reviewed the chart and agree with the documentation and plan. I have also made any necessary editorial changes.  [place] Bing, MD 04/05/2021 10:44 AM

## 2021-04-10 ENCOUNTER — Encounter: Payer: Self-pay | Admitting: Medical

## 2021-04-23 ENCOUNTER — Other Ambulatory Visit: Payer: Self-pay

## 2021-04-23 ENCOUNTER — Other Ambulatory Visit (HOSPITAL_COMMUNITY)
Admission: RE | Admit: 2021-04-23 | Discharge: 2021-04-23 | Disposition: A | Discharge status: Home / Self Care | Payer: Medicaid Other | Source: Ambulatory Visit | Attending: Family Medicine | Admitting: Family Medicine

## 2021-04-23 ENCOUNTER — Ambulatory Visit (INDEPENDENT_AMBULATORY_CARE_PROVIDER_SITE_OTHER): Payer: Medicaid Other

## 2021-04-23 VITALS — BP 109/73 | HR 79 | Ht 65.5 in | Wt 135.6 lb

## 2021-04-23 DIAGNOSIS — N898 Other specified noninflammatory disorders of vagina: Secondary | ICD-10-CM | POA: Insufficient documentation

## 2021-04-23 DIAGNOSIS — R300 Dysuria: Secondary | ICD-10-CM

## 2021-04-23 DIAGNOSIS — O26891 Other specified pregnancy related conditions, first trimester: Secondary | ICD-10-CM | POA: Diagnosis not present

## 2021-04-23 MED ORDER — CEPHALEXIN 500 MG PO CAPS: 500.0000 mg | ORAL_CAPSULE | Freq: Four times a day (QID) | ORAL | 0 refills | Status: AC

## 2021-04-23 NOTE — Progress Notes (Signed)
Pt here today for possible UTI. Pt states has urinary symptoms of burning. Pt also states having vaginal discharge that is odorous since today. Pt has hx of BV in April 2022. Has hx of Chlamydia in April 2021 and was treated.   Pt states drinking 2 bottles of water a day and has tried flavored water, gatorade and they do not settle well. Pt states mostly drinking sodas and juices all day. Pt also not eating well, has lost 2.5lbs from first visit. Advised to eat crackers, etc. To maintain health and weight during pregnancy.   Pt denies fever, back pain, abd pain or vaginal bleeding.   Ran UA for urinary symptoms and self swab today for vaginal discharge. Pt agreeable with plan of care. Self swab collected today. Pt advised results will take 24-48 hours and will see results in mychart and will be notified if needs further treatment.  Pt verbalized understanding.   UA was positive in office for leukocytes and urine was cloudy and odorous. Urine culture sent. Will review with Lodema Hong, CNM for advice. Advised to give Keflex Rx per protocol. Increase fluids, especially water and keep new OB intake appt on 04/25/21. Pt verbalized understanding.  Judeth Cornfield, RN

## 2021-04-24 ENCOUNTER — Other Ambulatory Visit: Payer: Self-pay

## 2021-04-24 LAB — CERVICOVAGINAL ANCILLARY ONLY
Bacterial Vaginitis (gardnerella): NEGATIVE
Candida Glabrata: NEGATIVE
Candida Vaginitis: POSITIVE — AB
Chlamydia: NEGATIVE
Comment: NEGATIVE
Comment: NEGATIVE
Comment: NEGATIVE
Comment: NEGATIVE
Comment: NEGATIVE
Comment: NORMAL
Neisseria Gonorrhea: NEGATIVE
Trichomonas: NEGATIVE

## 2021-04-24 LAB — POCT URINALYSIS DIP (DEVICE)
Bilirubin Urine: NEGATIVE
Glucose, UA: NEGATIVE mg/dL
Hgb urine dipstick: NEGATIVE
Ketones, ur: NEGATIVE mg/dL
Nitrite: NEGATIVE
Protein, ur: NEGATIVE mg/dL
Specific Gravity, Urine: 1.02 (ref 1.005–1.030)
Urobilinogen, UA: 0.2 mg/dL (ref 0.0–1.0)
pH: 7.5 (ref 5.0–8.0)

## 2021-04-25 ENCOUNTER — Telehealth: Payer: Self-pay

## 2021-04-25 ENCOUNTER — Other Ambulatory Visit: Payer: Self-pay

## 2021-04-25 ENCOUNTER — Telehealth (INDEPENDENT_AMBULATORY_CARE_PROVIDER_SITE_OTHER): Payer: Medicaid Other | Admitting: *Deleted

## 2021-04-25 DIAGNOSIS — O99891 Other specified diseases and conditions complicating pregnancy: Secondary | ICD-10-CM

## 2021-04-25 DIAGNOSIS — Z3490 Encounter for supervision of normal pregnancy, unspecified, unspecified trimester: Secondary | ICD-10-CM | POA: Insufficient documentation

## 2021-04-25 DIAGNOSIS — Z3A00 Weeks of gestation of pregnancy not specified: Secondary | ICD-10-CM

## 2021-04-25 DIAGNOSIS — N289 Disorder of kidney and ureter, unspecified: Secondary | ICD-10-CM

## 2021-04-25 DIAGNOSIS — J45909 Unspecified asthma, uncomplicated: Secondary | ICD-10-CM | POA: Insufficient documentation

## 2021-04-25 DIAGNOSIS — N83209 Unspecified ovarian cyst, unspecified side: Secondary | ICD-10-CM

## 2021-04-25 DIAGNOSIS — J4520 Mild intermittent asthma, uncomplicated: Secondary | ICD-10-CM

## 2021-04-25 DIAGNOSIS — F411 Generalized anxiety disorder: Secondary | ICD-10-CM

## 2021-04-25 DIAGNOSIS — N809 Endometriosis, unspecified: Secondary | ICD-10-CM

## 2021-04-25 LAB — URINE CULTURE, OB REFLEX

## 2021-04-25 LAB — CULTURE, OB URINE

## 2021-04-25 MED ORDER — GOJJI WEIGHT SCALE MISC: 1.0000 | 0 refills | Status: AC | PRN

## 2021-04-25 MED ORDER — COMPLETENATE 29-1 MG PO CHEW: 1.0000 | CHEWABLE_TABLET | Freq: Every day | ORAL | 11 refills | Status: DC

## 2021-04-25 MED ORDER — TERCONAZOLE 0.4 % VA CREA: 1.0000 | TOPICAL_CREAM | Freq: Every day | VAGINAL | 0 refills | Status: AC

## 2021-04-25 MED ORDER — BLOOD PRESSURE KIT DEVI: 1.0000 | 0 refills | Status: AC | PRN

## 2021-04-25 MED ORDER — ACETAMINOPHEN 500 MG PO TABS: 1000.0000 mg | ORAL_TABLET | Freq: Four times a day (QID) | ORAL | 0 refills | Status: DC | PRN

## 2021-04-25 NOTE — Patient Instructions (Signed)
  At our [facility], we work as an integrated team, providing care to address both physical and emotional health. Your medical provider may refer you to see our Behavioral Health Clinician [facility]) on the same day you see your medical provider, as availability permits; often scheduled virtually at your convenience.  Our [facility] is available to all patients, visits generally last between 20-30 minutes, but can be longer or shorter, depending on patient need. The [facility] offers help with stress management, coping with symptoms of depression and anxiety, major life changes , sleep issues, changing risky behavior, grief and loss, life stress, working on personal life goals, and  behavioral health issues, as these all affect your overall health and wellness.  The [facility] is NOT available for the following: FMLA paperwork, court-ordered evaluations, specialty assessments (custody or disability), letters to employers, or obtaining certification for an emotional support animal. The [facility] does not provide long-term therapy. You have the right to refuse integrated behavioral health services, or to reschedule to see the [facility] at a later date.  Confidentiality exception: If it is suspected that a child or disabled adult is being abused or neglected, we are required by law to report that to either Child Protective Services or Adult Management consultant.  If you have a diagnosis of Bipolar affective disorder, Schizophrenia, or recurrent Major depressive disorder, we will recommend that you establish care with a psychiatrist, as these are lifelong, chronic conditions, and we want your overall emotional health and medications to be more closely monitored. If you anticipate needing extended maternity leave due to mental health issues postpartum, it it recommended you inform your medical provider, so we can put in a referral to a psychiatrist as soon as possible. The [facility] is unable to recommend an extended maternity leave for mental  health issues. Your medical provider or [facility] may refer you to a therapist for ongoing, traditional therapy, or to a psychiatrist, for medication management, if it would benefit your overall health. Depending on your insurance, you may have a copay or be charged a deductible, depending on your insurance, to see the [facility]. If you are uninsured, it is recommended that you apply for financial assistance. (Forms may be requested at the front desk for in-person visits, via MyChart, or request a form during a virtual visit).  If you see the [facility] more than 6 times, you will have to complete a comprehensive clinical assessment interview with the [facility] to resume integrated services.  For virtual visits with the [facility], you must be physically in the state of [place] at the time of the visit. For example, if you live in [place], you will have to do an in-person visit with the [facility], and your out-of-state insurance may not cover behavioral health services in [place]. If you are going out of the state or country for any reason, the [facility] may see you virtually when you return to [place], but not while you are physically outside of [place].    [email or web address]  is a great website with information

## 2021-04-25 NOTE — Progress Notes (Signed)
New OB Intake  I connected with  Hannah Mccarthy on 04/25/21 at  1:15 PM EDT by MyChart Video Visit and verified that I am speaking with the correct person using two identifiers. Nurse is located at [facility] and pt is located at home.  I discussed the limitations, risks, security and privacy concerns of performing an evaluation and management service by telephone and the availability of in person appointments. I also discussed with the patient that there may be a patient responsible charge related to this service. The patient expressed understanding and agreed to proceed.  I explained I am completing New OB Intake today. We discussed her EDD of 11/24/21 that is based on LMP of 02/17/21. Pt is G1/P000. I reviewed her allergies, medications, Medical/Surgical/OB history, and appropriate screenings. I informed her of [facility] services. Based on history, this is a/an  pregnancy complicated by endometriosis  .   Patient Active Problem List   Diagnosis Date Noted   Supervision of low-risk pregnancy 04/25/2021   Renal disorder    Endometriosis    Asthma    Simple ovarian cyst    Proteinuria 08/04/2018   GAD (generalized anxiety disorder) 06/02/2016    Concerns addressed today  Delivery Plans:  Plans to deliver at [facility].   MyChart/Babyscripts MyChart access verified. I explained pt will have some visits in office and some virtually. Babyscripts instructions given and order placed. Patient verifies receipt of registration text/e-mail.   Blood Pressure Cuff  Blood pressure cuff ordered for patient to pick-up from [employer]. Explained after first prenatal appt pt will check weekly and document in Babyscripts.  Weight scale: Patient  does not  have weight scale. Weight scale ordered  for pt to pick up.   Anatomy US Explained first scheduled [place] will be around 19 weeks. Anatomy US will be scheduled and pt. Notified by MyChart. .  Labs Discussed [name] genetic screening with patient. Would like  both Panorama and Horizon drawn at new OB visit. Routine prenatal labs needed.  Covid Vaccine Patient has had the covid vaccine.   Mother/ Baby Dyad Candidate?    If yes, offer as possibility  Inform patient of [facility] and place . In AVS   Social Determinants of Health Food Insecurity: Patient expresses food insecurity. Food Market information given to patient; explained patient may visit at the end of first OB appointment. WIC Referral: Patient is interested in referral to [facility].  Transportation: Patient denies transportation needs. Childcare: Discussed no children allowed at ultrasound appointments. Offered childcare services; patient declines childcare services at this time.  First visit review I reviewed new OB appt with pt. I explained she will have a pelvic exam, ob bloodwork with genetic screening, and PAP smear. Explained pt will be seen by Vonzella Nipple, PA at first visit; encounter routed to appropriate provider. Explained that patient will be seen by pregnancy navigator following visit with provider. [facility] information placed in AVS.   Hannah Mcinroy,RN 04/25/2021  3:58 PM

## 2021-04-25 NOTE — Telephone Encounter (Signed)
-----   Message from Brand Males, CNM sent at 04/24/2021  9:19 PM EDT ----- Patient has yeast. Please send terazol 7 cream x7 days to pharmacy.  Thanks! Duwayne Heck, CNM

## 2021-04-25 NOTE — Telephone Encounter (Signed)
Spoke with pt. Pt given results per Lodema Hong, CNM. Pt verbalized understanding and agreeable to plan of care. Rx Terazol cream sent to pharmacy.   Laney Pastor

## 2021-04-26 NOTE — Progress Notes (Signed)
Chart reviewed for nurse visit. Agree with plan of care.   Marny Lowenstein, PA-C 04/26/2021 12:12 PM

## 2021-04-29 ENCOUNTER — Other Ambulatory Visit: Payer: Self-pay | Admitting: Lactation Services

## 2021-04-29 DIAGNOSIS — Z3490 Encounter for supervision of normal pregnancy, unspecified, unspecified trimester: Secondary | ICD-10-CM | POA: Diagnosis not present

## 2021-04-29 MED ORDER — PROMETHAZINE HCL 25 MG PO TABS: 25.0000 mg | ORAL_TABLET | Freq: Four times a day (QID) | ORAL | 0 refills | Status: DC | PRN

## 2021-04-29 NOTE — Progress Notes (Signed)
Phenergan send via standing order for c/o N&V with early pregnancy.

## 2021-05-08 ENCOUNTER — Telehealth: Payer: Self-pay | Admitting: Lactation Services

## 2021-05-08 NOTE — Telephone Encounter (Signed)
Called Hannah Mccarthy and he reports that due to type of Medicaid requires Pre Authorization for the scale. Will need to research Pre Auth for approval.

## 2021-05-13 ENCOUNTER — Encounter: Payer: Self-pay | Admitting: Medical

## 2021-05-13 ENCOUNTER — Other Ambulatory Visit (HOSPITAL_COMMUNITY)
Admission: RE | Admit: 2021-05-13 | Discharge: 2021-05-13 | Disposition: A | Discharge status: Home / Self Care | Payer: Medicaid Other | Source: Ambulatory Visit | Attending: Medical | Admitting: Medical

## 2021-05-13 ENCOUNTER — Other Ambulatory Visit: Payer: Self-pay

## 2021-05-13 ENCOUNTER — Ambulatory Visit (INDEPENDENT_AMBULATORY_CARE_PROVIDER_SITE_OTHER): Payer: Medicaid Other | Admitting: Medical

## 2021-05-13 VITALS — BP 105/73 | HR 93 | Wt 138.1 lb

## 2021-05-13 DIAGNOSIS — Z3490 Encounter for supervision of normal pregnancy, unspecified, unspecified trimester: Secondary | ICD-10-CM | POA: Diagnosis not present

## 2021-05-13 DIAGNOSIS — Z3A12 12 weeks gestation of pregnancy: Secondary | ICD-10-CM

## 2021-05-13 DIAGNOSIS — N289 Disorder of kidney and ureter, unspecified: Secondary | ICD-10-CM | POA: Diagnosis not present

## 2021-05-13 DIAGNOSIS — F411 Generalized anxiety disorder: Secondary | ICD-10-CM | POA: Diagnosis not present

## 2021-05-13 DIAGNOSIS — Z3491 Encounter for supervision of normal pregnancy, unspecified, first trimester: Secondary | ICD-10-CM | POA: Insufficient documentation

## 2021-05-13 DIAGNOSIS — J4520 Mild intermittent asthma, uncomplicated: Secondary | ICD-10-CM | POA: Diagnosis not present

## 2021-05-13 DIAGNOSIS — N809 Endometriosis, unspecified: Secondary | ICD-10-CM

## 2021-05-13 NOTE — Patient Instructions (Addendum)
Safe Medications in Pregnancy   Acne:  Benzoyl Peroxide  Salicylic Acid   Backache/Headache:  Tylenol: 2 regular strength every 4 hours OR               2 Extra strength every 6 hours   Colds/Coughs/Allergies:  Benadryl (alcohol free) 25 mg every 6 hours as needed  Breath right strips  Claritin  Cepacol throat lozenges  Chloraseptic throat spray  Cold-Eeze- up to three times per day  Cough drops, alcohol free  Flonase (by prescription only)  Guaifenesin  Mucinex  Robitussin DM (plain only, alcohol free)  Saline nasal spray/drops  Sudafed (pseudoephedrine) & Actifed * use only after [redacted] weeks gestation and if you do not have high blood pressure  Tylenol  Vicks Vaporub  Zinc lozenges  Zyrtec   Constipation:  Colace  Ducolax suppositories  Fleet enema  Glycerin suppositories  Metamucil  Milk of magnesia  Miralax  Senokot  Smooth move tea   Diarrhea:  Kaopectate  Imodium A-D   *NO pepto Bismol   Hemorrhoids:  Anusol  Anusol HC  Preparation H  Tucks   Indigestion:  Tums  Maalox  Mylanta  Zantac  Pepcid   Insomnia:  Benadryl (alcohol free) 25mg every 6 hours as needed  Tylenol PM  Unisom, no Gelcaps   Leg Cramps:  Tums  MagGel   Nausea/Vomiting:  Bonine  Dramamine  Emetrol  Ginger extract  Sea bands  Meclizine  Nausea medication to take during pregnancy:  Unisom (doxylamine succinate 25 mg tablets) Take one tablet daily at bedtime. If symptoms are not adequately controlled, the dose can be increased to a maximum recommended dose of two tablets daily (1/2 tablet in the morning, 1/2 tablet mid-afternoon and one at bedtime).  Vitamin B6 100mg tablets. Take one tablet twice a day (up to 200 mg per day).   Skin Rashes:  Aveeno products  Benadryl cream or 25mg every 6 hours as needed  Calamine Lotion  1% cortisone cream   Yeast infection:  Gyne-lotrimin 7  Monistat 7    **If taking multiple medications, please check labels to avoid  duplicating the same active ingredients  **take medication as directed on the label  ** Do not exceed 4000 mg of tylenol in 24 hours  **Do not take medications that contain aspirin or ibuprofen          AREA PEDIATRIC/FAMILY PRACTICE PHYSICIANS  [place] ([address]) [facility] Chambliss, MD; Eniola, MD; Hale, MD; Hensel, MD; McDiarmid, MD; McIntyer, MD; Neal, MD; Walden, MD [address] [place], [place] [address] [phone] Mon-Fri 8:30-12:30, 1:30-5:00 Providers come to see babies at [facility] Accepting Medicaid [facility] providers who accept newborns: Koirala, MD; Morrow, MD; Wolters, MD [address], [place], [place] [address] [phone] Mon-Fri 8:00-5:30 Babies seen by providers at [facility] Does NOT accept Medicaid Please call early in hospitalization for appointment (limited availability)  [facility] Mulberry, MD [address] [place], [place] [address] [phone] Mon, Tue, Thur, Fri 8:30-5:00, Wed 10:00-7:00 (closed 1-2pm) Babies seen by [facility] providers Accepting Medicaid Rubin - Pediatrician Rubin, MD [address], [place], [place] [address] [phone] Mon-Fri 8:30-5:00, Sat 8:30-12:00 Provider comes to see babies at [facility] Accepting Medicaid Must have been referred from current patients or contacted office prior to delivery [facility] ([facility]) Brown, MD; Chandler, MD; Ettefagh,   MD; Grant, MD; Lester, MD; McCormick, MD; McQueen, MD; Prose, MD; Simha, MD; Stanley, MD; Stryffeler, NP; Tebben, NP [address], [place], [place] [address] [phone] Mon, Tue, Thur, Fri 8:30-5:30, Wed 9:30-5:30, Sat 8:30-12:30 Babies seen by [facility] providers Accepting Medicaid Only accepting infants of first-time parents or  siblings of current patients Hospital discharge coordinator will make follow-up appointment Jack Amos [address], [place], [place]  [address] [phone]   Fax - [phone] [facility] [address], [place], [place]  [address] Phone - [phone]   Fax - [phone] Shilpa Gosrani [address], [place], [place]  [address] [phone]  [place] ([address]) [facility] Bates, MD; Brassfield, MD; Cooper, Cox, MD; MD; Davis, MD; Dovico, MD; Ettefaugh, MD; Little, MD; Lowe, MD; Keiffer, MD; Melvin, MD; Sumner, MD; Williams, MD [address], [place], [place] [address] [phone] Mon-Fri 8:30-5:00 (extended evenings Mon-Thur as needed), Sat-Sun 10:00-1:00 Providers come to see babies at [facility] Accepting Medicaid for families of first-time babies and families with all children in the household age 3 and under. Must register with office prior to making appointment (M-F only). [facility] Henson, NP; Knapp, MD; Lalonde, MD; Tysinger, PA [address] [place], [place] [address] [phone] Mon-Fri 8:00-5:00 Babies seen by providers at [facility] Does NOT accept Medicaid/Commercial Insurance Only [facility] ([facility])  Artis, MD; Barnes, MD; Bratton, MD; Coccaro, MD; [place] Gardner, MD; Kramer, MD; Marshall, MD; Netherton, MD; Poleto, MD; Skinner, MD [address] [place], [place] [address] [phone] Mon-Fri 8:30-5:30, Sat (Oct.-Mar.) 9:00-1:00 Babies seen by providers at [facility] Accepting Medicaid  [place] ([address]) [facility] Reid, MD; Warner, MD [address], [place], [place] [address] [phone] Mon-Fri 8:30-5:00, Sat 8:30-12:00 Providers come to see babies at [facility] Does NOT accept Medicaid [facility] Becker, PA; Hagler, MD; Scifres, PA; Sun,  MD; Swayne, MD [address], [place], [place] [address] [phone] Mon-Fri 8:00-5:00 Babies seen by providers at [facility] Does NOT accept Medicaid Only accepting babies of parents who are patients Please call early in hospitalization for appointment (limited availability) [facility] Clark, MD; Frye, MD; Kelleher, MD; Mack, NP; Miller, MD; O'Keller, MD; Patterson, NP; Pudlo, MD; Puzio, MD; Thomas, MD; Tucker, MD; Twiselton, MD [address], [place], [place] [address] [phone] Mon-Fri 8:00-5:00, Sat 9:00-12:00 Providers come to see babies at [facility] Does NOT accept Medicaid  [place] ([address]) [facility] providers accepting new patients: Brake, NP; Wharton, PA [address], [place], [place] [address] [phone] Mon-Fri 8:00-5:00 Babies seen by providers at [facility] Does NOT accept Medicaid Only accepting babies of parents who are patients Please call early in hospitalization for appointment (limited availability) [facility] Gay, MD; Quinlan, MD [address] [place], [place] [address] [phone] (press 1 to schedule appointment) Mon-Fri 8:00-5:00 Providers come to see babies at [facility] Does NOT accept Medicaid [facility] Mazer, MD [address] [place], [place] [address] [phone] Mon-Fri 8:30-5:00 (lunch 12:30-1:00), extended hours by appointment only Wed 5:00-6:30 Babies seen by [facility] providers Accepting Medicaid [facility] Banks, MD; Jordan, MD; Koberlein, MD [address], , [place] [address] [phone] Mon-Fri 8:00-5:00 Babies seen by [facility] providers Does NOT accept Medicaid [facility] Parker, MD; Hunter, MD; Wallace, DO [address]   [place], [place] [address] [phone] Mon-Fri 8:00-5:00 Babies seen by [facility] providers Does NOT accept Medicaid [facility] Brandon, PA; Brecken, PA; Christy, NP; Dees, MD; DeClaire, MD; DeWeese, MD; Hansen, NP; Mills, NP; Parrish, NP; Smoot, NP; Summer, MD; Vapne, MD [address] [place], [place] [address] [phone] Mon-Fri 8:30-5:00, Sat 10:00-1:00 Providers come to see babies at [facility] Does NOT accept Medicaid Free prenatal information session Tuesdays at 4:45pm [facility] Bouska, MD; Gordon, PA; Jeffery, PA; Weber, PA [address] [place] [address] [phone] Mon-Fri 7:30-5:30 Babies seen by [facility] providers [facility] [address], [place], [place]  [address] [phone]   Fax - [phone]  [place] ([address] & [address]) [facility] [place] [address] [place], [place] [address] [phone] Mon-Thur 8:00-6:00 Providers come to see babies at [facility] Accepting Medicaid [facility] Anderson, NP; Badger, MD; Beal, PA; [place], PA [address] [place], [place] [address] [phone] Mon-Thur 7:30-7:30, Fri 7:30-4:30 Babies seen by [facility] providers Accepting Medicaid [facility] Agbuya, MD; Klett, NP; Romgoolam, MD [address], [place], [place] [address] [phone] Mon-Fri 8:30-5:00, Sat 8:30-12:00 Providers come to see babies at [facility] Accepting Medicaid Must have "Meet & Greet" appointment at office prior to delivery [facility] ([facility] McCord, MD; Wallace, MD; Wood, MD [address], [place], [place] [address] [phone] Mon-Wed 8:00-6:00, Thur-Fri 8:00-5:00, Sat 9:00-12:00 Providers come to see babies at [facility] Does NOT accept Medicaid Only accepting siblings of current patients [facility]  [address], [place], [place]  [address] [phone]   Fax - [phone] [facility] [address], [place], [place]  [address] [phone]   Fax - [phone]  [place] ([address] & [address]) [facility] Cirigliano, DO; Matthews, DO [address] [place], [place] [address] [phone] Mon-Fri 7:00-5:00 Babies seen by [facility] providers Does NOT accept Medicaid [facility] Briscoe, MD; Howley, PA; Moreira, PA [address], [place], [place] [address] [phone] Mon-Fri 8:00-5:00 Babies seen by [facility] providers Accepting Medicaid [facility] Boyd, MD; Church, PA; Jones, NP; Osborn, PA [address], [place], [place] [address] [phone] Mon-Fri 8:00-5:00 Babies seen by providers at [facility] Accepting Medicaid  [place] ([address]) [facility] [place] Wendling, DO [address] [place], [place] [address] [phone] Mon-Fri 8:00-5:00 Babies seen by [facility] providers Does NOT accept Medicaid Limited availability, please call early in hospitalization to schedule follow-up [facility] Calderon, PA; Cummings, MD; Dillard, MD; Martin, PA; Olson, MD; VanDeven, PA [address], [place], [place] [address] [phone] Mon-Fri 8:30-5:00, Sat 9:00-12:00 Babies seen by providers at [facility] Accepting Medicaid Please register online then schedule online or call office [email or web address]) Hunter, NP; Kumar, MD; Martin Rogers, PA [address], [place], [place] [address] [phone] Mon-Fri 8:00-5:00 Babies seen by providers at [facility] Accepting Medicaid [facility] ([facility]) Littlefork, MD; Kristi Fleenor, NP; West, MD [address], [place], [place] [address] [phone] Mon-Fri 8:00-5:30, Sat&Sun by appointment (phones open at 8:30) Babies seen by [facility] providers Accepting Medicaid Must be   a first-time baby or sibling of current patient [facility] [place]  [address], [place], [place]  [address] [phone]   Fax - [phone]  [place] ([address]) [facility] Brown, PA; Cowen, PA; Rice, MD; Helton, PA; Spry, MD [address] [place], [place] [address] [phone] Mon-Thur 8:00-7:00, Fri 8:00-5:00, Sat 8:00-12:00, Sun 9:00-12:00 Babies seen by [facility] providers Accepting Medicaid [facility] Coe-Goins, MD; Marshall, MD; Pierre-Louis, MD [address] [place], [place], [place] [address] [phone] Mon-Thur 8:00-5:00 Babies seen by providers at [facility] Accepting Medicaid [facility] Bratton, MD; Coe-Goins, MD; Hayes, MD; Lewis, MD; List, MD; Lott, MD; Marshall, MD; Moran, MD; O'Neal, MD; Pierre-Louis, MD; Pitonzo, MD; Scholer, MD; Spangle, MD [address] [place], [place] [address] [phone] Mon-Fri 8:00-5:30, Sat (Oct.-Mar.) 9:00-1:00 Babies seen by providers at [facility] Accepting Medicaid Must fill out new patient packet, available online at [email or web address] [facility] ([facility]) Friddle, NP; Harris, NP; Kelly, NP; Logan, MD; Melvin, PA; Poth, MD; Ramadoss, MD; Stanton, NP [address], [place], [place] [address] [phone] Mon-Thur 8:00-5:30, Fri 8:00-5:00 Babies seen by providers at [facility] Accepting Medicaid  [place] ([address]) [facility] Dixon, PA; Manchester, MD; Pickard, MD; Tapia, PA [address],  [place], [place] [address] [phone] Mon-Fri 8:00-5:00 Babies seen by providers at [facility] Accepting Medicaid   [place] ([address]) [facility] [place] Masneri, DO; Meyers, MD; Nelson, PA [address], [place], [place] [address] [phone] Mon-Fri 8:00-5:00 Babies seen by providers at [facility] Does NOT accept Medicaid Limited appointment availability, please call early in hospitalization  [facility] [place] Kunedd, DO; McGowen, MD [address], [place], [place] [address] [phone] Mon-Fri 8:00-5:00 Babies seen by [facility] providers Does NOT accept Medicaid [facility] [place] Cameron, MD; MacDonald, MD; Michaels, PA; Nayak, MD [address] [place] [address], [place], [place] [address] [phone] Mon-Fri 8:00-5:00 After hours clinic ([address] [place], [place] [address]) [phone] Mon-Fri 5:00-8:00, Sat 12:00-6:00, Sun 10:00-4:00 Babies seen by [facility] providers Accepting Medicaid [facility] [place] [address], [place], [place]  [address] [phone]   Fax - [phone]  [place] ([address]) [facility] [place] [facility] Andy, MD [address], [place], [place] [address] [phone] Mon-Fri 8:00-5:00 Babies seen by [facility] providers Does NOT accept Medicaid [facility] [place] ([facility] [place]) Eksir, MD [address], [place], [place] [address] [phone] Mon-Thur 8:00-7:00, Fri 8:00-5:00, Sat 8:00-12:00 Babies seen by providers at [facility] Accepting Medicaid - but does not have vaccinations in office (must be received elsewhere) Limited availability, please call early in hospitalization  [place] ([address]) [facility]  Charlene Flemming, MD [address], [place] [address] [phone]  Fax [phone]  [facility]  Kimberly Newton, MD, Annamarie Streilein, PA, Carla Hampton, PA [address] [place], [place] [address] [phone] [facility]  [address], [place], [place] [address] [phone] [address], [place], [place] [address] [phone] (West Office)  [facility] [address], [place],  [address] [phone] [facility] [address], ,   [place] [address] [phone] [facility] [address], [place], [place] [address] [phone] [facility] [address], [place], [place] [address] [phone] [facility] [address], [place], [place] [address] [phone] [facility] [address], [place], [place] [address] [phone] [facility]  [address], [place], [place] [address] [phone] Dr[facility] [address] [place] [address], [place], [place] [address] [phone] [facility] [address], [place], [place] [address] [phone] [facility] [address], , [place] [address] [phone]   Childbirth Education Options: [facility] Classes:  Childbirth education classes can help you get ready for a positive parenting experience. You can also meet other expectant parents and get free stuff for your baby. Each class runs for five weeks on the same night and costs $45 for the mother-to-be and her support person. Medicaid covers the cost if you are eligible. Call [phone] to register. [facility] Childbirth Education:  Register at [email or web address]   There are fees associated with some of these classes. Check website for most up-to-date information.   Baby & Me Class: Discuss newborn & infant parenting and family adjustment issues with other new mothers in a relaxed environment. Each week brings  a new speaker or baby-centered activity. We encourage new mothers to join us every Thursday at 11:00am. Babies birth until crawling. No registration or fee. Daddy Boot Camp: This course offers Dads-to-be the tools and knowledge needed to feel confident on their journey to becoming new fathers. Experienced dads, who have been trained as coaches, teach dads-to-be how to hold, comfort, diaper, swaddle and play with their infant while being able to support the new mom as well. A class for men taught by men.  Big Brother/Big Sister: Let your children share in the joy of a new brother or sister in this special class designed just for them. Class includes discussion about how families care for babies: swaddling, holding, diapering, safety as well as how they can be helpful in their new role. This class is designed for children ages 2 to 6, but any age is welcome. Please register each child individually.  Mom Talk: This mom-led group offers support and connection to mothers as they journey through the adjustments and struggles of that sometimes overwhelming first year after the birth of a child. Tuesdays at 10:00am and Thursdays at 6:00pm. Babies welcome. No registration or fee. Breastfeeding Support Group: This group is a mother-to-mother support circle where moms have the opportunity to share their breastfeeding experiences. A Lactation Consultant is present for questions and concerns. Meets each Tuesday at 11:00am. No fee or registration. Breastfeeding Your Baby: Learn what to expect in the first days of breastfeeding your newborn.  This class will help you feel more confident with the skills needed to begin your breastfeeding experience. Many new mothers are concerned about breastfeeding after leaving the hospital. This class will also address the most common fears and challenges about breastfeeding during the first few weeks, months and beyond. (call for fee) Comfort Techniques and Tour: This 2 hour interactive  class will provide you the opportunity to learn & practice hands-on techniques that can help relieve some of the discomfort of labor and encourage your baby to rotate toward the best position for birth. You and your partner will be able to try a variety of labor positions with birth balls and rebozos as well as practice breathing, relaxation, and visualization techniques. A tour of the [facility]   [facility] Center is included with this class.  Childbirth Class- Weekend Option: This class is a Weekend version of our Birth & Baby series. It is designed for parents who have a difficult time fitting several weeks of classes into their schedule. It covers the care of your newborn and the basics of labor and childbirth. It also includes a Maternity Care Center Tour of [facility] and lunch. The class is held two consecutive days: beginning on Friday evening from 6:30 - 8:30 p.m. and the next day, Saturday from 9 a.m. - 4 p.m. (call for fee) Waterbirth Class: Interested in a waterbirth?  This informational class will help you discover whether waterbirth is the right fit for you. Education about waterbirth itself, supplies you would need and how to assemble your support team is what you can expect from this class. Some obstetrical practices require this class in order to pursue a waterbirth. (Not all obstetrical practices offer waterbirth-check with your healthcare provider.) Register only the expectant mom, but you are encouraged to bring your partner to class! Required if planning waterbirth, no fee. Infant/Child CPR: Parents, grandparents, babysitters, and friends learn Cardio-Pulmonary Resuscitation skills for infants and children. You will also learn how to treat both conscious and unconscious choking in infants and children. This Family & Friends program does not offer certification. Register each participant individually to ensure that enough mannequins are available. (Call for fee) Grandparent  Love: Expecting a grandbaby? This class is for you! Learn about the latest infant care and safety recommendations and ways to support your own child as he or she transitions into the parenting role. Taught by Registered Nurses who are childbirth instructors, but most importantly...they are grandmothers too! Childbirth Class- Natural Childbirth: This series of 5 weekly classes is for expectant parents who want to learn and practice natural methods of coping with the process of labor and childbirth. Relaxation, breathing, massage, visualization, role of the partner, and helpful positioning are highlighted. Participants learn how to be confident in their body's ability to give birth. This class will empower and help parents make informed decisions about their own care. Includes discussion that will help new parents transition into the immediate postpartum period. Maternity Care Center Tour of [facility] is included. We suggest taking this class between 25-32 weeks, but it's only a recommendation. Childbirth Class- 3 week Series: This option of 3 weekly classes helps you and your labor partner prepare for childbirth. Newborn care, labor & birth, cesarean birth, pain management, and comfort techniques are discussed and a Maternity Care Center Tour of [facility] is included. The class meets at the same time, on the same day of the week for 3 consecutive weeks beginning with the starting date you choose.  Marvelous Multiples: Expecting twins, triplets, or more? This class covers the differences in labor, birth, parenting, and breastfeeding issues that face multiples' parents. NICU tour is included. Led by a Certified Childbirth Educator who is the mother of twins. No fee. Caring for Baby: This class is for expectant and adoptive parents who want to learn and practice the most up-to-date newborn care for their babies. Focus is on birth through the first six weeks of life. Topics include feeding, bathing,  diapering, crying, umbilical cord care, circumcision care and safe sleep. Parents learn to recognize symptoms of illness and when to call the pediatrician. Register only the mom-to-be and your partner or support person can plan to come with you!  Childbirth Class- online option: This online class offers you the   freedom to complete a Birth and Baby series in the comfort of your own home. The flexibility of this option allows you to review sections at your own pace, at times convenient to you and your support people. It includes additional video information, animations, quizzes, and extended activities. Get organized with helpful eClass tools, checklists, and trackers. Once you register online for the class, you will receive an email within a few days to accept the invitation and begin the class when the time is right for you. The content will be available to you for 60 days.         

## 2021-05-13 NOTE — Progress Notes (Signed)
   PRENATAL VISIT NOTE  Subjective:  Hannah Mccarthy is a 20 y.o. G1P0 at [redacted]w[redacted]d being seen today for her first prenatal visit for this pregnancy.  She is currently monitored for the following issues for this low-risk pregnancy and has Proteinuria; Supervision of low-risk pregnancy; GAD (generalized anxiety disorder); Renal disorder; Endometriosis; Asthma; and Simple ovarian cyst on their problem list.  Patient reports no complaints.  Contractions: Not present. Vag. Bleeding: None.  Movement: Absent. Denies leaking of fluid.   She is planning to breastfeed. Desires Depo for contraception.   The following portions of the patient's history were reviewed and updated as appropriate: allergies, current medications, past family history, past medical history, past social history, past surgical history and problem list.   Objective:   Vitals:   05/13/21 1412  BP: 105/73  Pulse: 93  Weight: 138 lb 1.6 oz (62.6 kg)    Fetal Status: Fetal Heart Rate (bpm): U/S   Movement: Absent     General:  Alert, oriented and cooperative. Patient is in no acute distress.  Skin: Skin is warm and dry. No rash noted.   Cardiovascular: Normal heart rate and rhythm noted  Respiratory: Normal respiratory effort, no problems with respiration noted. Clear to auscultation.   Abdomen: Soft, gravid, appropriate for gestational age. Normal bowel sounds. Non-tender. Pain/Pressure: Present     Pelvic: Cervical exam performed       Normal cervical contour, no lesions, no bleeding following pap, normal discharge  Extremities: Normal range of motion.  Edema: None  Mental Status: Normal mood and affect. Normal behavior. Normal judgment and thought content.   Assessment and Plan:  Pregnancy: G1P0 at [redacted]w[redacted]d 1. Encounter for supervision of low-risk pregnancy in first trimester - Hemoglobin A1c - Genetic Screening - Culture, OB Urine - [facility] AMB BABYSCRIPTS SCHEDULE OPTIMIZATION - Cytology - PAP( [place]) -  CBC/D/Plt+RPR+Rh+ABO+RubIgG... - Anatomy US scheduled 07/01/21  2. GAD (generalized anxiety disorder)  3. Mild intermittent asthma without complication - Has inhaler PRN  4. Renal disorder - Stage 1 - Comp Met (CMET) - Has specialist   5. [redacted] weeks gestation of pregnancy  6. Endometriosis - Diagnosed in [place] in 2018  Preterm labor/first trimester warning symptoms and general obstetric precautions including but not limited to vaginal bleeding, contractions, leaking of fluid and fetal movement were reviewed in detail with the patient. Please refer to After Visit Summary for other counseling recommendations.   Discussed the normal visit cadence for prenatal care Discussed the nature of our practice with multiple providers including residents and students   Return in about 4 weeks (around 06/10/2021) for LOB, [place].  Future Appointments  Date Time Provider [place]  06/11/2021 10:15 AM Dargan Sink [facility]  07/01/2021  9:45 AM [facility] US4 [facility]    Kerry Hough, PA-C

## 2021-05-13 NOTE — Progress Notes (Signed)
Patient complains of pain on "right side of lower abdomen"

## 2021-05-14 LAB — CBC/D/PLT+RPR+RH+ABO+RUBIGG...
Antibody Screen: NEGATIVE
Basophils Absolute: 0 10*3/uL (ref 0.0–0.2)
Basos: 0 %
EOS (ABSOLUTE): 0 10*3/uL (ref 0.0–0.4)
Eos: 0 %
HCV Ab: <0.1 s/co ratio (ref 0.0–0.9)
HIV Screen 4th Generation wRfx: NONREACTIVE
Hematocrit: 37.2 % (ref 34.0–46.6)
Hemoglobin: 12.7 g/dL (ref 11.1–15.9)
Hepatitis B Surface Ag: NEGATIVE
Immature Grans (Abs): 0 10*3/uL (ref 0.0–0.1)
Immature Granulocytes: 0 %
Lymphocytes Absolute: 1.6 10*3/uL (ref 0.7–3.1)
Lymphs: 32 %
MCH: 26.7 pg (ref 26.6–33.0)
MCHC: 34.1 g/dL (ref 31.5–35.7)
MCV: 78 fL — ABNORMAL LOW (ref 79–97)
Monocytes Absolute: 0.4 10*3/uL (ref 0.1–0.9)
Monocytes: 8 %
Neutrophils Absolute: 2.9 10*3/uL (ref 1.4–7.0)
Neutrophils: 60 %
Platelets: 278 10*3/uL (ref 150–450)
RBC: 4.75 x10E6/uL (ref 3.77–5.28)
RDW: 13.4 % (ref 11.7–15.4)
RPR Ser Ql: NONREACTIVE
Rh Factor: POSITIVE
Rubella Antibodies, IGG: 1.94 index (ref >0.99)
WBC: 5 10*3/uL (ref 3.4–10.8)

## 2021-05-14 LAB — COMPREHENSIVE METABOLIC PANEL
ALT: 8 IU/L (ref 0–32)
AST: 13 IU/L (ref 0–40)
Albumin/Globulin Ratio: 1.5 (ref 1.2–2.2)
Albumin: 4.1 g/dL (ref 3.9–5.0)
Alkaline Phosphatase: 74 IU/L (ref 44–121)
BUN/Creatinine Ratio: 8 — ABNORMAL LOW (ref 9–23)
BUN: 4 mg/dL — ABNORMAL LOW (ref 6–20)
Bilirubin Total: 0.2 mg/dL (ref 0.0–1.2)
CO2: 20 mmol/L (ref 20–29)
Calcium: 9.1 mg/dL (ref 8.7–10.2)
Chloride: 100 mmol/L (ref 96–106)
Creatinine, Ser: 0.48 mg/dL — ABNORMAL LOW (ref 0.57–1.00)
Globulin, Total: 2.8 g/dL (ref 1.5–4.5)
Glucose: 74 mg/dL (ref 65–99)
Potassium: 3.9 mmol/L (ref 3.5–5.2)
Sodium: 135 mmol/L (ref 134–144)
Total Protein: 6.9 g/dL (ref 6.0–8.5)
eGFR: 137 mL/min/{1.73_m2} (ref >59)

## 2021-05-14 LAB — HEMOGLOBIN A1C
Est. average glucose Bld gHb Est-mCnc: 100 mg/dL
Hgb A1c MFr Bld: 5.1 % (ref 4.8–5.6)

## 2021-05-14 LAB — HCV INTERPRETATION

## 2021-05-15 ENCOUNTER — Encounter: Payer: Self-pay | Admitting: Medical

## 2021-05-15 LAB — URINE CULTURE, OB REFLEX

## 2021-05-15 LAB — CULTURE, OB URINE

## 2021-05-17 LAB — CYTOLOGY - PAP
Chlamydia: NEGATIVE
Comment: NEGATIVE
Comment: NORMAL
Diagnosis: NEGATIVE
Neisseria Gonorrhea: NEGATIVE

## 2021-05-18 ENCOUNTER — Telehealth: Payer: Self-pay | Admitting: Obstetrics & Gynecology

## 2021-05-18 NOTE — Telephone Encounter (Signed)
Call from Babys[employer]s- BP 107/91.  Pt called she notes pelvic and back pain as well as mild headache.  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  Reviewed conservative management including hydration, rest and tylenol.  Unfortunately, she notes minimal improvement with tylenol.  Questions and concerns were addressed, advised to f/u as scheduled or may call back/try to be seen sooner if symptoms worsen.  Myna Hidalgo, DO Attending Obstetrician & Gynecologist, [facility] for [employer], [facility]

## 2021-05-22 ENCOUNTER — Encounter: Payer: Self-pay | Admitting: Medical

## 2021-05-22 ENCOUNTER — Encounter: Payer: Self-pay | Admitting: *Deleted

## 2021-06-10 ENCOUNTER — Encounter: Payer: Self-pay | Admitting: Medical

## 2021-06-10 ENCOUNTER — Telehealth: Payer: Self-pay | Admitting: Lactation Services

## 2021-06-10 NOTE — Telephone Encounter (Signed)
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   Reviewed that it is recommended she call [facility] at [phone] to set up a telephone Genetic Counseling Session to discuss results and recommendation for FOB to be tested also.   Reviewed we have saliva kits in the office for FOB testing and FOB can either come to next appointment or pick up kit at next appointment to take home to completed.   Patient voiced understanding. Patient reports she has no questions or concerns at this time.

## 2021-06-11 ENCOUNTER — Ambulatory Visit (INDEPENDENT_AMBULATORY_CARE_PROVIDER_SITE_OTHER): Payer: Medicaid Other

## 2021-06-11 ENCOUNTER — Encounter: Payer: Self-pay | Admitting: *Deleted

## 2021-06-11 ENCOUNTER — Other Ambulatory Visit: Payer: Self-pay

## 2021-06-11 VITALS — BP 106/69 | HR 97 | Wt 139.9 lb

## 2021-06-11 DIAGNOSIS — Z3490 Encounter for supervision of normal pregnancy, unspecified, unspecified trimester: Secondary | ICD-10-CM

## 2021-06-11 DIAGNOSIS — Z3A16 16 weeks gestation of pregnancy: Secondary | ICD-10-CM

## 2021-06-11 NOTE — Progress Notes (Signed)
   PRENATAL VISIT NOTE  Subjective:  Hannah Mccarthy is a 20 y.o. G1P0 at [redacted]w[redacted]d being seen today for ongoing prenatal care.  She is currently monitored for the following issues for this low-risk pregnancy and has Proteinuria; Supervision of low-risk pregnancy; GAD (generalized anxiety disorder); Renal disorder; Endometriosis; Asthma; and Simple ovarian cyst on their problem list.  Patient reports no complaints. Reports occasional bilateral sharp lower abdominal pain and back pain. Occurs only with position changes and movement. Takes Tylenol without relief. Denies bleeding or discharge. Contractions: Not present. Vag. Bleeding: None.  Movement: Absent. Denies leaking of fluid.   The following portions of the patient's history were reviewed and updated as appropriate: allergies, current medications, past family history, past medical history, past social history, past surgical history and problem list.   Objective:   Vitals:   06/11/21 1033  BP: 106/69  Pulse: 97  Weight: 139 lb 14.4 oz (63.5 kg)    Fetal Status: Fetal Heart Rate (bpm): 143   Movement: Absent     General:  Alert, oriented and cooperative. Patient is in no acute distress.  Skin: Skin is warm and dry. No rash noted.   Cardiovascular: Normal heart rate noted  Respiratory: Normal respiratory effort, no problems with respiration noted  Abdomen: Soft, gravid, appropriate for gestational age.  Pain/Pressure: Present     Pelvic: Cervical exam deferred        Extremities: Normal range of motion.  Edema: None  Mental Status: Normal mood and affect. Normal behavior. Normal judgment and thought content.   Assessment and Plan:  Pregnancy: G1P0 at [redacted]w[redacted]d 1. [redacted] weeks gestation of pregnancy  - AFP, Serum, Open Spina Bifida   2. Encounter for supervision of low-risk pregnancy, antepartum - Routine OB care. Doing well.  - Bilateral sharp lower abdominal pain with movement and position changes. Likely round ligament pain. -  Reassurance provided. I recommend continued use of Tylenol prn as well as stretching, heat/ice, and support belt. Ensure adequate PO hydration.    Preterm labor symptoms and general obstetric precautions including but not limited to vaginal bleeding, contractions, leaking of fluid and fetal movement were reviewed in detail with the patient. Please refer to After Visit Summary for other counseling recommendations.   Return in about 4 weeks (around 07/09/2021).  Future Appointments  Date Time Provider Department Center  07/01/2021  9:45 AM [facility] US4 [facility]  07/09/2021 10:35 AM [place], Brand Males, NP [facility]    Brand Males, CNM 06/11/21 10:56 AM

## 2021-06-11 NOTE — Progress Notes (Signed)
Patient stated that she has been having sharp pelvic pain that started about 2 weeks ago

## 2021-06-13 LAB — AFP, SERUM, OPEN SPINA BIFIDA
AFP MoM: 0.82
AFP Value: 34 ng/mL
Gest. Age on Collection Date: 16.2 weeks
Maternal Age At EDD: 23 yr
OSBR Risk 1 IN: 10000
Test Results:: NEGATIVE
Weight: 139 [lb_av]

## 2021-06-19 ENCOUNTER — Other Ambulatory Visit: Payer: Self-pay

## 2021-06-19 ENCOUNTER — Ambulatory Visit: Payer: Medicaid Other | Attending: Obstetrics and Gynecology

## 2021-06-19 DIAGNOSIS — O99019 Anemia complicating pregnancy, unspecified trimester: Secondary | ICD-10-CM

## 2021-06-19 DIAGNOSIS — D573 Sickle-cell trait: Secondary | ICD-10-CM

## 2021-06-19 NOTE — Progress Notes (Signed)
Referring provider:  MCW D. 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 She was present at the visit with her mother and possible father of the baby, Hannah Mccarthy.   We first reviewed the results of her [employer] carrier screening which were negative for CF, SMA and alpha thalassemia.  Panorama testing for chromosome conditions was also normal. The testing showed that she is a carrier for sickle cell disease. We discussed that SCD is one condition in a group of blood disorders that affect hemoglobin in red blood cells (hemoglobinopathies). Hemoglobin is a protein that transports oxygen from the lungs to organs and tissues throughout the body. Individuals with SCD have an inherited structural abnormality in hemoglobin's beta globin chains due to a single amino acid change in the HBB gene. Instead of producing normal adult hemoglobin (HbA), individuals with SCD produce an atypical form of hemoglobin called hemoglobin S (HbS). Typically, individuals are expected to have two copies of HbA (HbAA). Individuals who are carriers of SCD have one copy of HbA and one copy of HbS (HbS), whereas individuals affected by SCD have two copies of HbS (HbSS). Carriers of SCD are often said to have sickle cell "trait".   HbS alters the configuration of the hemoglobin molecule. As a result, individuals with SCD have red blood cells that can change shape (sickle) and obstruct blood flow in small blood vessels, causing ischemia of tissues and organs and episodes of vaso-occlusive crisis. The amino acid change in the HBB gene also causes red blood cells to become fragile and break down easily, which results in chronic anemia. Additional complications associated with SCD may include organ damage, frequent infections, acute chest syndrome,  ischemic stroke, splenic sequestration, priapism, and pulmonary hypertension. SCD is inherited in an autosomal recessive pattern, where both parents must carry HbS trait to be at risk of having an affected child. If the father of the baby were also a carrier of SCD, the couple would have a 1 in 4 (25%) chance of having a child with SCD. In that scenario, testing of the pregnancy would be available with amniocentesis. Testing after birth through newborn screening or genetic testing from cord blood is also an option.   HbS is just one variant form of hemoglobin caused by a mutations in the HBB gene. It is also possible that the father of the baby could carry another variant form of hemoglobin, such as hemoglobin C (HbC). If he did, the couple would have a 1 in 4 (25%) chance of having a child with hemoglobin [place] disease (HbSC disease). Individuals with HbSC disease have red blood cells that contain both HbS and HbC. These variant forms of hemoglobin can cause red blood cells to become rigid and sickle, blocking small blood vessels and making it difficult for the red blood cells to deliver oxygen to the body's tissues. This can cause severe pain and organ damage, just as we see in individuals with SCD. Individuals with HbSC disease are at risk of the same complications as those associated with SCD, such as pain crises, acute chest syndrome, infections, asplenia, and strokes; however, these complications may occur at a lesser frequency and may be milder.   Finally, the father of the pregancy may have a different variant in the HBB gene that could make him a carrier for  beta-thalassemia. If he did, the couple would have a 1 in 4 (25%) chance of having a child with sickle beta thalassemia. The severity of sickle beta thalassemia depends on the normal amount of beta globin that is produced. If an individual produces no beta globin (sickle beta zero thalassemia), they will experience symptoms similar to SCD. If an  individual produces a reduced amount of beta globin (sickle beta plus thalassemia), they may experience symptoms that are similar to a milder form of SCD.   The patient stated that there are two possible men who may be the father of this pregnancy.  Both are of African American ancestry and would have a chance of approximately 1 in 10 to 1 in 12 for being a carrier of a hemoglobinopathy.  The patient has arranged to have both men tested through her OB to refine the risks for the current pregnancy to be affected with SCD, HbSC disease, or sickle beta thalassemia.   We also obtained a detailed family history and pregnancy history.  This is the first pregnancy for Hannah Mccarthy.  She reported no complications in the pregnancy.  She has a personal history of stage 1 kidney failure.  This was diagnosed several years ago due to protein in her urine sample and was stage 2 at that time.  We scheduled for her to meet with our the maternal fetal medicine specialist for a consult following her ultrasound on 07/01/21.  In the family, there is a strong history of mental health conditions in the patient, her sister, mother and grandmother.  We reviewed that even though there is no genetic testing available for mental health conditions, they are thought to have strong inherited components in many families and this pregnancy is expected to be at an increased risk for this type of condition.  She also reported a maternal first cousin with mild autism. He is now in college and functions independently.  We discussed that there may be many causes for autism, including some that are inherited (such as Fragile X syndrome), and others that are not.  If he has had genetic testing to help determine the cause for his condition, we are happy to review that information.  Lastly, the possible father of the baby, Hannah Mccarthy, was born with Hirschsprung disease. This condition is the result of abnormally functioning section of bowel and is present at  birth.  It occurs in about 1 in 5,000 births and is more common in males than females.  Hirschsprung can be present as an isolated birth defect or may be part of several genetic syndromes or single gene disorders. In the absence of a known cause, the recurrence risk in children of an affected parent is estimated to be 2%. This number may be higher if a long segment was affected in Hannah Mccarthy. The remainder of the family history is unremarkable for birth defects, recurrent pregnancy loss and known genetic conditions.  Plan of care: MFM consult scheduled following u/s on 8/29 due to kidney failure. Hemoglobinopathy testing on possible FOBs (Hannah Mccarthy and Hannah Mccarthy) to be ordered through OB. Follow up on FOB testing as needed.  At this time the patient expressed that she is not interested in amniocentesis, so testing after birth is appropriate.  We appreciate being involved in the care of this patient and can be reached at [phone].  Cherly Anderson, MS, CGC

## 2021-07-01 ENCOUNTER — Ambulatory Visit: Payer: Medicaid Other | Admitting: *Deleted

## 2021-07-01 ENCOUNTER — Ambulatory Visit: Payer: Medicaid Other | Attending: Medical

## 2021-07-01 ENCOUNTER — Encounter: Payer: Self-pay | Admitting: *Deleted

## 2021-07-01 ENCOUNTER — Other Ambulatory Visit: Payer: Self-pay | Admitting: *Deleted

## 2021-07-01 ENCOUNTER — Other Ambulatory Visit: Payer: Self-pay | Admitting: Medical

## 2021-07-01 ENCOUNTER — Ambulatory Visit (HOSPITAL_BASED_OUTPATIENT_CLINIC_OR_DEPARTMENT_OTHER): Payer: Medicaid Other | Admitting: Obstetrics

## 2021-07-01 ENCOUNTER — Other Ambulatory Visit: Payer: Self-pay

## 2021-07-01 VITALS — BP 115/56 | HR 74

## 2021-07-01 DIAGNOSIS — N809 Endometriosis, unspecified: Secondary | ICD-10-CM

## 2021-07-01 DIAGNOSIS — Z148 Genetic carrier of other disease: Secondary | ICD-10-CM | POA: Insufficient documentation

## 2021-07-01 DIAGNOSIS — O99512 Diseases of the respiratory system complicating pregnancy, second trimester: Secondary | ICD-10-CM | POA: Insufficient documentation

## 2021-07-01 DIAGNOSIS — Z3A19 19 weeks gestation of pregnancy: Secondary | ICD-10-CM

## 2021-07-01 DIAGNOSIS — O26832 Pregnancy related renal disease, second trimester: Secondary | ICD-10-CM

## 2021-07-01 DIAGNOSIS — O99012 Anemia complicating pregnancy, second trimester: Secondary | ICD-10-CM | POA: Insufficient documentation

## 2021-07-01 DIAGNOSIS — O99019 Anemia complicating pregnancy, unspecified trimester: Secondary | ICD-10-CM | POA: Insufficient documentation

## 2021-07-01 DIAGNOSIS — Z3490 Encounter for supervision of normal pregnancy, unspecified, unspecified trimester: Secondary | ICD-10-CM

## 2021-07-01 DIAGNOSIS — J45909 Unspecified asthma, uncomplicated: Secondary | ICD-10-CM | POA: Diagnosis not present

## 2021-07-01 DIAGNOSIS — D573 Sickle-cell trait: Secondary | ICD-10-CM | POA: Insufficient documentation

## 2021-07-01 DIAGNOSIS — N181 Chronic kidney disease, stage 1: Secondary | ICD-10-CM | POA: Insufficient documentation

## 2021-07-01 DIAGNOSIS — O3482 Maternal care for other abnormalities of pelvic organs, second trimester: Secondary | ICD-10-CM | POA: Insufficient documentation

## 2021-07-01 DIAGNOSIS — N83209 Unspecified ovarian cyst, unspecified side: Secondary | ICD-10-CM

## 2021-07-01 DIAGNOSIS — O2692 Pregnancy related conditions, unspecified, second trimester: Secondary | ICD-10-CM | POA: Diagnosis present

## 2021-07-01 DIAGNOSIS — Z362 Encounter for other antenatal screening follow-up: Secondary | ICD-10-CM

## 2021-07-01 NOTE — Progress Notes (Signed)
MFM Note  Hannah Mccarthy was seen for a detailed fetal anatomy scan due to a history of chronic renal disease.  She has also screened positive as a carrier for sickle cell trait.  The patient reports that she was diagnosed with renal disease about 2 or 3 years ago when she had a 24-hour urine that showed 2065 mg of protein.  Her most recent serum creatinine level was 0.48.  She has been seen by a nephrologist who has recommended only continued expectant management.  She had a renal ultrasound performed 2 years ago that did not show significant renal pathology.  She does not have a history of hypertension.  Her blood pressure today was 115/56.  She denies any other significant past medical history and denies any problems in her current pregnancy.    She had a cell free DNA test earlier in her pregnancy which indicated a low risk for trisomy 16, 7, and 13. A female fetus is predicted.   She was informed that the fetal growth and amniotic fluid level were appropriate for her gestational age.   There were no obvious fetal anomalies noted on today's ultrasound exam.  The patient was informed that anomalies may be missed due to technical limitations. If the fetus is in a suboptimal position or maternal habitus is increased, visualization of the fetus in the maternal uterus may be impaired.  During today's consultation, the following were discussed:  Chronic renal disease and pregnancy  The implications and management of renal disease in pregnancy was discussed today.  She was advised that women with chronic renal disease may be at increased risk for fetal growth restriction, preeclampsia, and an indicated preterm birth.  She was reassured that based on her normal serum creatinine levels, I anticipate that she will most likely have a good pregnancy outcome.  As it has been 2 years since her last 24-hour urine collection, she should have another 24-hour urine collected as soon as possible to assess for  total protein.  Due to the increased risk of preeclampsia, she should start taking a daily baby aspirin (81 mg daily) for preeclampsia prophylaxis.  Due to significant proteinuria, we will continue to follow her with growth ultrasounds throughout her pregnancy.  Carrier for sickle cell trait  The patient was advised that sickle cell disease is inherited in an autosomal recessive condition.  Therefore, in order for her baby to have sickle cell disease, both parents have to be carriers for the sickle cell trait.  When both parents are carriers for the trait, the baby will have a 25% chance of having the disease.  As it is uncertain who the father of the baby is, testing the father the baby may not be possible.  The patient was advised to have her baby examined after birth to determine if the baby has sickle cell disease.  The option for an amniocentesis for definitive diagnosis of sickle cell disease in her fetus was discussed.  She declined an amniocentesis today.    As women with a sickle cell trait may be at increased risk for urinary tract infections, she should have a urine culture performed during each trimester.  A follow-up growth scan was scheduled in 4 weeks.    The patient stated that all of her questions had been answered to her complete satisfaction.  A total of 30 minutes was spent counseling and coordinating the care for this patient.  Greater than 50% of the time was spent in direct face-to-face contact.  Recommendations:  Start daily baby aspirin (81 mg daily) Monthly growth ultrasounds 24-hour urine collection to assess for total protein Urine culture in second and third trimesters of pregnancy

## 2021-07-09 ENCOUNTER — Other Ambulatory Visit (HOSPITAL_COMMUNITY)
Admission: RE | Admit: 2021-07-09 | Discharge: 2021-07-09 | Disposition: A | Discharge status: Home / Self Care | Payer: Medicaid Other | Source: Ambulatory Visit | Attending: Nurse Practitioner | Admitting: Nurse Practitioner

## 2021-07-09 ENCOUNTER — Other Ambulatory Visit: Payer: Self-pay

## 2021-07-09 ENCOUNTER — Ambulatory Visit (INDEPENDENT_AMBULATORY_CARE_PROVIDER_SITE_OTHER): Payer: Medicaid Other | Admitting: Nurse Practitioner

## 2021-07-09 VITALS — BP 104/64 | HR 71 | Wt 146.5 lb

## 2021-07-09 DIAGNOSIS — Z3492 Encounter for supervision of normal pregnancy, unspecified, second trimester: Secondary | ICD-10-CM

## 2021-07-09 DIAGNOSIS — F419 Anxiety disorder, unspecified: Secondary | ICD-10-CM

## 2021-07-09 DIAGNOSIS — N898 Other specified noninflammatory disorders of vagina: Secondary | ICD-10-CM

## 2021-07-09 DIAGNOSIS — R102 Pelvic and perineal pain: Secondary | ICD-10-CM

## 2021-07-09 DIAGNOSIS — Z3A20 20 weeks gestation of pregnancy: Secondary | ICD-10-CM

## 2021-07-09 DIAGNOSIS — D573 Sickle-cell trait: Secondary | ICD-10-CM | POA: Insufficient documentation

## 2021-07-09 DIAGNOSIS — J45909 Unspecified asthma, uncomplicated: Secondary | ICD-10-CM

## 2021-07-09 LAB — POCT URINALYSIS DIP (DEVICE)
Bilirubin Urine: NEGATIVE
Glucose, UA: NEGATIVE mg/dL
Hgb urine dipstick: NEGATIVE
Ketones, ur: NEGATIVE mg/dL
Nitrite: NEGATIVE
Protein, ur: NEGATIVE mg/dL
Specific Gravity, Urine: 1.02 (ref 1.005–1.030)
Urobilinogen, UA: 0.2 mg/dL (ref 0.0–1.0)
pH: 7.5 (ref 5.0–8.0)

## 2021-07-09 MED ORDER — ALBUTEROL SULFATE HFA 108 (90 BASE) MCG/ACT IN AERS: 2.0000 | INHALATION_SPRAY | RESPIRATORY_TRACT | 2 refills | Status: DC | PRN

## 2021-07-09 MED ORDER — MONTELUKAST SODIUM 10 MG PO TABS: 10.0000 mg | ORAL_TABLET | Freq: Every day | ORAL | 2 refills | Status: DC

## 2021-07-09 NOTE — Progress Notes (Signed)
    Subjective:  Hannah Mccarthy is a 20 y.o. G1P0 at [redacted]w[redacted]d being seen today for ongoing prenatal care.  She is currently monitored for the following issues for this low-risk pregnancy and has Proteinuria; Supervision of low-risk pregnancy; GAD (generalized anxiety disorder); Renal disorder; Endometriosis; Asthma; Simple ovarian cyst; and Sickle cell trait (HCC) on their problem list.  Patient reports no complaints.  Contractions: Not present. Vag. Bleeding: None.  Movement: Present. Denies leaking of fluid.   The following portions of the patient's history were reviewed and updated as appropriate: allergies, current medications, past family history, past medical history, past social history, past surgical history and problem list. Problem list updated.  Objective:   Vitals:   07/09/21 1114  BP: 104/64  Pulse: 71  Weight: 146 lb 8 oz (66.5 kg)    Fetal Status: Fetal Heart Rate (bpm): 145   Movement: Present     General:  Alert, oriented and cooperative. Patient is in no acute distress.  Skin: Skin is warm and dry. No rash noted.   Cardiovascular: Normal heart rate noted  Respiratory: Normal respiratory effort, no problems with respiration noted  Abdomen: Soft, gravid, appropriate for gestational age. Pain/Pressure: Present     Pelvic:  Cervical exam deferred        Extremities: Normal range of motion.  Edema: None  Mental Status: Normal mood and affect. Normal behavior. Normal judgment and thought content.   Urinalysis:      Assessment and Plan:  Pregnancy: G1P0 at [redacted]w[redacted]d  1. Encounter for supervision of low-risk pregnancy in second trimester Having some back pain and pelvic pain - referred to PT Reviewed BP and weight in Babyscripts Given peds list Advised to sign up for childbirth and breastfeeding classes - found list of classes on [facility] website Mother and her brother accompany her to the visit today.  - Culture, OB Urine  2. Discharge from the vagina Did not know to expect  increased discharge in pregnany - swab done to rule out infection as possible cause - Cervicovaginal ancillary only( [place])  3. Sickle cell trait (HCC) OB urine culture done today for second trimester  4. Persistent asthma without complication, unspecified asthma severity Stopped all her meds with pregnancy - uses nebulizer about every 2 weeks Advised to restart singulair and to use albuterol inhaler for rescue  5. Anxiety Stopped seeing her mental health provider and stopped all her previous medications Has not seen inhouse BH in this pregnancy  - Ambulatory referral to Integrated Behavioral Health  6. Pelvic pain Advised to watch for a phone call from [facility] - Ambulatory referral to Physical Therapy   Preterm labor symptoms and general obstetric precautions including but not limited to vaginal bleeding, contractions, leaking of fluid and fetal movement were reviewed in detail with the patient. Please refer to After Visit Summary for other counseling recommendations.  Return in about 4 weeks (around 08/06/2021) for in person ROB.  [name], RN, MSN, NP-BC Nurse Practitioner, [facility] for [employer], [facility] 07/09/2021 11:53 AM

## 2021-07-09 NOTE — Patient Instructions (Signed)
AREA PEDIATRIC/FAMILY PRACTICE PHYSICIANS  Central/[place]outheast [place] ([address]) [facility] Chambliss, MD; Eniola, MD; Hale, MD; Hensel, MD; McDiarmid, MD; McIntyer, MD; Neal, MD; Walden, MD [address] [place], [place] [address] [phone] Mon-Fri 8:30-12:30, 1:30-5:00 Providers come to see babies at [facility] Accepting Medicaid [facility] providers who accept newborns: Koirala, MD; Morrow, MD; Wolters, MD [address], [place], [place] [address] [phone] Mon-Fri 8:00-5:30 Babies seen by providers at [facility] Does NOT accept Medicaid Please call early in hospitalization for appointment (limited availability)  [facility] Mulberry, MD [address]  [place], [place] [address] [phone] Mon, Tue, Thur, Fri 8:30-5:00, Wed 10:00-7:00 (closed 1-2pm) Babies seen by [facility] providers Accepting Medicaid Rubin - Pediatrician Rubin, MD [address], [place], [place] [address] [phone] Mon-Fri 8:30-5:00, Sat 8:30-12:00 Provider comes to see babies at [facility] Accepting Medicaid Must have been referred from current patients or contacted office prior to delivery [facility] ([facility]) Brown, MD; Chandler, MD; Ettefagh, MD; Grant, MD; Lester, MD; McCormick, MD; McQueen, MD; Prose, MD; Simha, MD; Stanley, MD; Stryffeler, NP; Tebben, NP [address], [place], [place] [address] [phone] Mon, Tue, Thur, Fri 8:30-5:30, Wed 9:30-5:30, Sat 8:30-12:30 Babies seen by [facility] providers Accepting Medicaid Only accepting infants of first-time parents or siblings of current patients Hospital discharge coordinator will make follow-up appointment Jack Amos [address], [place], [place]  [address] [phone]   Fax - [phone] [facility] [address], [place], [place]  [address] Phone - [phone]   Fax - [phone] Shilpa Gosrani [address], [place], [place]  [address] [phone]  [place] ([address]) [facility] Bates, MD; Brassfield, MD; Cooper, Cox, MD; MD; Davis, MD; Dovico, MD; Ettefaugh, MD; Little, MD; Lowe, MD; Keiffer, MD; Melvin, MD; Sumner, MD; Williams, MD [address], [place], [place] [address] [phone] Mon-Fri 8:30-5:00 (extended evenings Mon-Thur as needed), Sat-Sun 10:00-1:00 Providers come to see babies at [facility] Accepting Medicaid for families of first-time babies and families with all children in the household age 3 and under. Must register with office prior to making appointment (M-F only). [facility] Henson, NP; Knapp, MD; Lalonde, MD; Tysinger, PA [address] [place], [place] [address] [phone] Mon-Fri 8:00-5:00 Babies seen by providers at [facility] Does NOT accept Medicaid/Commercial Insurance Only [facility] ([facility])  Artis, MD; Barnes, MD; Bratton, MD; Coccaro, MD; Lockett Gardner, MD; Kramer, MD; Marshall, MD; Netherton, MD; Poleto, MD; Skinner, MD [address] [place], [place] [address] [phone] Mon-Fri 8:30-5:30, Sat (Oct.-Mar.) 9:00-1:00 Babies seen by providers at [facility] Accepting Medicaid  [place] ([address]) [facility] Reid, MD; Warner, MD [address], [place], [place] [address] [phone] Mon-Fri 8:30-5:00, Sat 8:30-12:00 Providers come to see babies at [facility] Does NOT accept Medicaid [facility] Becker, PA; Hagler, MD; Scifres, PA; Sun, MD; Swayne, MD [address], [place], [place] [address] [phone] Mon-Fri 8:00-5:00 Babies seen by providers at [facility] Does NOT accept Medicaid Only accepting babies of parents who  are patients Please call early in hospitalization for appointment (limited availability) [facility] Clark, MD; Frye, MD; Kelleher, MD; Mack, NP; Miller, MD; O'Keller, MD; Patterson, NP; Pudlo, MD; Puzio, MD; Thomas, MD; Tucker, MD; Twiselton, MD [address], [place], [place] [address] [phone] Mon-Fri 8:00-5:00, Sat 9:00-12:00 Providers come to see babies at [facility] Does NOT accept Medicaid  [place] ([address]) [facility] providers accepting new patients: Brake, NP; Wharton, PA [address], [place], [place] [address] [phone] Mon-Fri 8:00-5:00 Babies seen by providers at [facility] Does NOT accept Medicaid Only accepting babies of parents who are patients Please call early in hospitalization for appointment (limited availability) [facility] Gay, MD; Quinlan, MD [address] [place], [place] [address] [phone] (press 1 to schedule appointment) Mon-Fri 8:00-5:00 Providers come to see babies at [facility] Does NOT accept Medicaid [facility] Mazer, MD [address] [place], [place] [address] [phone] Mon-Fri 8:30-5:00 (lunch 12:30-1:00), extended hours by appointment only Wed 5:00-6:30 Babies seen by [facility] providers Accepting Medicaid [facility] Banks, MD; Jordan, MD; Koberlein, MD [address], [place], [place] [address] [phone] Mon-Fri 8:00-5:00 Babies seen by [facility] providers Does NOT accept Medicaid [facility] Parker, MD; Hunter, MD; Wallace, DO [address] [place], [place] [address] [phone] Mon-Fri 8:00-5:00 Babies seen by [facility] providers Does NOT accept Medicaid [facility] Brandon, PA; Brecken, PA; Christy, NP; Dees, MD; DeClaire, MD; DeWeese, MD; Hansen, NP; Mills, NP; Parrish, NP; Smoot, NP; Summer, MD; Vapne,  MD [address] [place], [place] [address] [phone] Mon-Fri 8:30-5:00, Sat 10:00-1:00 Providers come to see babies at [facility] Does NOT accept Medicaid Free prenatal information session Tuesdays at 4:45pm [facility] Bouska, MD; Gordon, PA; Jeffery, PA; Weber, PA [address] [place] [address] [phone] Mon-Fri 7:30-5:30 Babies seen by [facility] providers [facility] [address], [place], [place]  [address] [phone]   Fax - [phone]  [place] ([address] & [address]) [facility] [place] [address] [place], [place] [address] [phone] Mon-Thur 8:00-6:00 Providers come to see babies at [facility] Accepting Medicaid [facility] Anderson, NP; Badger, MD; Beal, PA; Spencer, PA [address] [place], [place] [address] [phone] Mon-Thur 7:30-7:30, Fri 7:30-4:30 Babies seen by [facility] providers Accepting Medicaid [facility] Agbuya, MD; Klett, NP; Romgoolam, MD [address], [place], [place] [address] [phone] Mon-Fri 8:30-5:00, Sat 8:30-12:00 Providers come to see babies at [facility] Accepting Medicaid Must have "Meet & Greet" appointment at office prior to delivery [facility] ([facility]) McCord, MD; Wallace, MD; Wood, MD [address], [place], [place] [address] [phone] Mon-Wed 8:00-6:00, Thur-Fri 8:00-5:00, Sat 9:00-12:00 Providers come to see babies at [facility] Does NOT accept Medicaid Only accepting siblings of current patients [facility]  [address], [place], [place]  [address] [phone]   Fax - [phone] [facility] [address], [place], [place]  [address] [phone]   Fax -  [phone]  [place] ([address] & [address]) [facility] Cirigliano, DO; Matthews, DO [address] [place], [place] [address] [phone] Mon-Fri 7:00-5:00 Babies seen by [facility] providers Does NOT accept Medicaid [facility] Briscoe, MD; Howley, PA; Moreira, PA [address], [place], [place] [address] [phone] Mon-Fri 8:00-5:00 Babies seen by [facility] providers Accepting Medicaid [facility] Boyd, MD; Church, PA; Jones, NP; Osborn, PA [address], [place], [place] [address] [phone] Mon-Fri 8:00-5:00 Babies seen by providers at [facility] Accepting Medicaid  [place] ([address]) [facility] [place] Wendling, DO [address] [place], [place] [address] [phone] Mon-Fri 8:00-5:00 Babies seen by [facility] providers Does NOT accept Medicaid Limited availability, please call early in hospitalization to schedule follow-up [facility] Calderon, PA; Cummings, MD; Dillard, MD; Martin, PA; Olson, MD; VanDeven, PA [address], [place], [place] [address] [phone] Mon-Fri 8:30-5:00, Sat 9:00-12:00 Babies seen by providers at [facility] Accepting Medicaid Please register online then schedule online or call office [email or web address]) Hunter, NP; Kumar, MD; Martin Rogers, PA [address], [place], [place] [address] [phone] Mon-Fri 8:00-5:00 Babies seen by providers at [facility] Accepting Medicaid [facility] ([facility]) [place], MD; Kristi Fleenor, NP; West, MD [address], [place], [place] [address] [phone] Mon-Fri 8:00-5:30, Sat&Sun by appointment (phones open at  8:30) Babies seen by [facility] providers Accepting Medicaid Must be a first-time baby or sibling of current patient [facility] [place]  [address], [place], [place]  [address] [phone]   Fax - [phone]  [place] ([address]) [facility] Brown, PA; Cowen, PA; Rice, MD; Helton, PA; Spry, MD [address] [place], [place] [address] [phone] Mon-Thur 8:00-7:00, Fri 8:00-5:00, Sat 8:00-12:00, Sun 9:00-12:00 Babies seen by [facility] providers Accepting Medicaid [facility] [place] Coe-Goins, MD; Marshall, MD; Pierre-Louis, MD [address] [place] [address], [place], [place] [address] [phone] Mon-Thur 8:00-5:00 Babies seen by providers at [facility] Accepting Medicaid [facility] Bratton, MD; Coe-Goins, MD; Hayes, MD; Lewis, MD; List, MD; Lott, MD; Marshall, MD; Moran, MD; O'Neal, MD; Pierre-Louis, MD; Pitonzo, MD; Scholer, MD; Spangle, MD [address] [place], [place] [address] [phone] Mon-Fri 8:00-5:30, Sat (Oct.-Mar.) 9:00-1:00 Babies seen by providers at [facility] Accepting Medicaid Must fill out new patient packet, available online at [email or web address] [facility] ([facility]) Friddle, NP; Harris, NP; Kelly, NP; Logan, MD; Melvin, PA; Poth, MD; Ramadoss, MD; Stanton, NP [address], [place], [place] [address] [phone] Mon-Thur 8:00-5:30, Fri 8:00-5:00 Babies seen by providers at [facility] Accepting Medicaid  [place] ([address]) [facility] Dixon, PA; [place], MD; Pickard, MD; Tapia, PA [address] [place] [address], [place], [place] [address] [phone] Mon-Fri 8:00-5:00 Babies seen by providers at [facility] Accepting Medicaid   [place] ([address]) [facility] [place] Masneri, DO; Meyers, MD; Nelson, PA [address], [place], [place] [address] [phone] Mon-Fri 8:00-5:00 Babies seen by providers at [facility] Does NOT accept Medicaid Limited appointment availability, please call early in hospitalization  [facility] [place] Kunedd, DO; McGowen, MD [address], [place], [place] [address] [phone] Mon-Fri 8:00-5:00 Babies seen by [facility] providers Does NOT accept Medicaid [facility] [place] Cameron, MD; MacDonald, MD; Michaels, PA; Nayak, MD [address] [place] [address], [place],  [address] [phone] Mon-Fri 8:00-5:00 After hours clinic ([address] [place],  [address]) [phone] Mon-Fri 5:00-8:00, Sat 12:00-6:00, Sun 10:00-4:00 Babies seen by [facility] providers Accepting Medicaid [facility] [place] [address], [place], [place]  [address] [phone]   Fax - [phone]  [place] ([address]) [facility] [place] [facility] Andy, MD [address], [place], [place] [address] [phone] Mon-Fri 8:00-5:00 Babies seen by [facility] providers Does NOT accept Medicaid [facility] [place] ([facility] [place]) Eksir, MD [address], [place], [place] [address] [phone] Mon-Thur 8:00-7:00, Fri 8:00-5:00, Sat 8:00-12:00 Babies seen by providers at [facility] Accepting Medicaid - but does not have vaccinations in office (must be received elsewhere) Limited availability, please call early in hospitalization  [place] ([address]) [facility]  Charlene Flemming, MD [address], [place] [address] [phone]  Fax [phone]  [facility]  Kimberly Newton, MD, Annamarie Streilein, PA, Carla Hampton, PA [address] [place], [place]  [address] [phone] [facility]  [address], [place], [place] [address] [phone] [address], [place], [place] [address] [phone] (West Office)  [facility] [address], [place], [place] [address] [phone] [facility] [address] [place], [place], [place] [address] [phone] [facility] [address], [place], [place] [address] [phone] [facility] [address], [place], [place] [address] [phone] [facility] [address], [place], [place] [address] [phone] [facility] [address], [place], [place] [address] [phone] [facility]  [address], [place], [place] [address] [phone] Dr[facility] [address] [place], [place], [place] [address] [phone] [facility] [address], [place], [place] [address] [phone] [facility] [address], ,  [address] [phone]  

## 2021-07-09 NOTE — Progress Notes (Signed)
Hannah Mccarthy signed-07/09/21 Pt is also having a lot of back pain, was advised of [facility] previously but feels that will not help right now.Advised of possible PT.

## 2021-07-10 NOTE — BH Specialist Note (Signed)
Integrated Behavioral Health via Telemedicine Visit  07/10/2021 Hannah Mccarthy [id number]  Number of Integrated Behavioral Health visits: 1 Session Start time: 2:29  Session End time: 2:55 Total time:  26  Referring Provider: [name], NP Patient/Family location[facility] Provider location: [facility]  All persons participating in visit: Patient Hannah Mccarthy and [facility] Fartun Paradiso   Types of Service: Individual psychotherapy and Telephone visit  I connected with [name] and/or [employer]  n/a  via  Telephone or Video Enabled Telemedicine Application  (Video is Caregility application) and verified that I am speaking with the correct person using two identifiers. Discussed confidentiality: Yes   I discussed the limitations of telemedicine and the availability of in person appointments.  Discussed there is a possibility of technology failure and discussed alternative modes of communication if that failure occurs.  I discussed that engaging in this telemedicine visit, they consent to the provision of behavioral healthcare and the services will be billed under their insurance.  Patient and/or legal guardian expressed understanding and consented to Telemedicine visit: Yes   Presenting Concerns: Patient and/or family reports the following symptoms/concerns: Primary concern is poor appetite some days, along with ongoing anxiety and history of depression; pt prefers no medication in pregnancy.  Duration of problem: Ongoing; Severity of problem: moderate  Patient and/or Family's Strengths/Protective Factors: Social connections and Sense of purpose  Goals Addressed: Patient will:  Reduce symptoms of: anxiety and depression   Increase knowledge and/or ability of: healthy habits   Demonstrate ability to: Increase healthy adjustment to current life circumstances and Increase motivation to adhere to plan of care  Progress  towards Goals: Ongoing  Interventions: Interventions utilized:  Psychoeducation and/or Health Education and Supportive Reflection Standardized Assessments completed:  PHQ9/GAD7 given in past two weeks  Patient and/or Family Response: Pt agrees with treatment plan  Assessment: Patient currently experiencing Generalized anxiety disorder (as previously diagnosed).   Patient may benefit from psychoeducation and brief therapeutic interventions regarding coping with symptoms of anxiety and depression .  Plan: Follow up with behavioral health clinician on : Three weeks; Call Asher Muir as needed prior to scheduled visit at [phone] Behavioral recommendations:  -Continue taking prenatal vitamin nightly -Continue prioritizing healthy self-care via sleeping and eating, as much as able -Consider setting timer on phone as reminder to eat at least every 5 hours in daytime -Consider viewing virtual tour of [facility] at [email or web address]  -Consider reading through Postpartum Planner on After Visit Summary Referral(s): Integrated [employer] (In Clinic)  I discussed the assessment and treatment plan with the patient and/or parent/guardian. They were provided an opportunity to ask questions and all were answered. They agreed with the plan and demonstrated an understanding of the instructions.   They were advised to call back or seek an in-person evaluation if the symptoms worsen or if the condition fails to improve as anticipated.  Rae Lips, LCSW  Depression screen [facility] 2/9 07/10/2021 06/11/2021 04/25/2021 04/23/2021 03/20/2021  Decreased Interest 1 1 0 0 0  Down, Depressed, Hopeless 1 1 0 0 0  PHQ - 2 Score 2 2 0 0 0  Altered sleeping 2 1 0 1 2  Tired, decreased energy 2 1 0 2 1  Change in appetite 2 1 3 2  0  Feeling bad or failure about yourself  0 1 0 0 0  Trouble concentrating 0 1 0 0 0  Moving slowly or fidgety/restless 0 1 0 0  0  Suicidal thoughts 0 1 0 0 0   PHQ-9 Score 8 9 3 5 3   Difficult doing work/chores - - - Not difficult at all -   GAD 7 : Generalized Anxiety Score 07/10/2021 06/11/2021 04/25/2021 04/23/2021  Nervous, Anxious, on Edge 2 1 0 1  Control/stop worrying 2 1 0 0  Worry too much - different things 2 1 0 0  Trouble relaxing 2 1 0 0  Restless 1 1 0 0  Easily annoyed or irritable 2 1 1 2   Afraid - awful might happen 1 1 0 0  Total GAD 7 Score 12 7 1  3

## 2021-07-11 ENCOUNTER — Ambulatory Visit (INDEPENDENT_AMBULATORY_CARE_PROVIDER_SITE_OTHER): Payer: Medicaid Other | Admitting: Clinical

## 2021-07-11 DIAGNOSIS — F411 Generalized anxiety disorder: Secondary | ICD-10-CM | POA: Diagnosis not present

## 2021-07-11 DIAGNOSIS — Z3A00 Weeks of gestation of pregnancy not specified: Secondary | ICD-10-CM

## 2021-07-11 DIAGNOSIS — O99340 Other mental disorders complicating pregnancy, unspecified trimester: Secondary | ICD-10-CM | POA: Diagnosis not present

## 2021-07-11 LAB — CERVICOVAGINAL ANCILLARY ONLY
Bacterial Vaginitis (gardnerella): NEGATIVE
Candida Glabrata: NEGATIVE
Candida Vaginitis: POSITIVE — AB
Chlamydia: NEGATIVE
Comment: NEGATIVE
Comment: NEGATIVE
Comment: NEGATIVE
Comment: NEGATIVE
Comment: NEGATIVE
Comment: NORMAL
Neisseria Gonorrhea: NEGATIVE
Trichomonas: NEGATIVE

## 2021-07-11 NOTE — Patient Instructions (Signed)
Center for [facility] at [facility] [place], [place] [address] [phone] (main office) [phone] [facility] office)  [email or web address] (view virtual tour of [facility], register for childbirth education classes, etc.)      BRAINSTORMING  Develop a Plan Goals: Provide a way to start conversation about your new life with a baby Assist parents in recognizing and using resources within their reach Help pave the way before birth for an easier period of transition afterwards.  Make a list of the following information to keep in a central location: Full name of Mom and Partner: _____________________________________________ 61 full name and Date of Birth: ___________________________________________ Home Address: ___________________________________________________________ ________________________________________________________________________ Home Phone: ____________________________________________________________ Parents' cell numbers: _____________________________________________________ ________________________________________________________________________ Name and contact info for OB: ______________________________________________ Name and contact info for Pediatrician:________________________________________ Contact info for Lactation Consultants: ________________________________________  REST and SLEEP *You each need at least 4-5 hours of uninterrupted sleep every day. Write specific names and contact information.* How are you going to rest in the postpartum period? While partner's home? When partner returns to work? When you both return to work? Where will your baby sleep? Who is available to help during the day? Evening? Night? Who could move in for a period to help support you? What are some ideas to help you get enough  sleep? __________________________________________________________________________________________________________________________________________________________________________________________________________________________________________ NUTRITIOUS FOOD AND DRINK *Plan for meals before your baby is born so you can have healthy food to eat during the immediate postpartum period.* Who will look after breakfast? Lunch? Dinner? List names and contact information. Brainstorm quick, healthy ideas for each meal. What can you do before baby is born to prepare meals for the postpartum period? How can others help you with meals? Which grocery stores provide online shopping and delivery? Which restaurants offer take-out or delivery options? ______________________________________________________________________________________________________________________________________________________________________________________________________________________________________________________________________________________________________________________________________________________________________________________________________  CARE FOR MOM *It's important that mom is cared for and pampered in the postpartum period. Remember, the most important ways new mothers need care are: sleep, nutrition, gentle exercise, and time off.* Who can come take care of mom during this period? Make a list of people with their contact information. List some activities that make you feel cared for, rested, and energized? Who can make sure you have opportunities to do these things? Does mom have a space of her very own within your home that's just for her? Make a "[facility]" where she can be comfortable, rest, and renew herself  daily. ______________________________________________________________________________________________________________________________________________________________________________________________________________________________________________________________________________________________________________________________________________________________________________________________________    CARE FOR AND FEEDING BABY *Knowledgeable and encouraging people will offer the best support with regard to feeding your baby.* Educate yourself and choose the best feeding option for your baby. Make a list of people who will guide, support, and be a resource for you as your care for and feed your baby. (Friends that have breastfed or are currently breastfeeding, lactation consultants, breastfeeding support groups, etc.) Consider a postpartum doula. (These websites can give you information: dona.org & BuyingShow.es) Seek out local breastfeeding resources like the breastfeeding support group at [employer] or [employer]. ______________________________________________________________________________________________________________________________________________________________________________________________________________________________________________________________________________________________________________________________________________________________________________________________________  Verner Chol AND ERRANDS Who can help with a thorough cleaning before baby is born? Make a list of people who will help with housekeeping and chores, like laundry, light cleaning, dishes, bathrooms, etc. Who can run some errands for you? What can you do to make sure you are stocked with basic supplies before baby is born? Who is going to do the  shopping? ______________________________________________________________________________________________________________________________________________________________________________________________________________________________________________________________________________________________________________________________________________________________________________________________________     Family Adjustment *Nurture yourselves.it helps parents be more loving and allows  for better bonding with their child.* What sorts of things do you and partner enjoy doing together? Which activities help you to connect and strengthen your relationship? Make a list of those things. Make a list of people whom you trust to care for your baby so you can have some time together as a couple. What types of things help partner feel connected to Mom? Make a list. What needs will partner have in order to bond with baby? Other children? Who will care for them when you go into labor and while you are in the hospital? Think about what the needs of your older children might be. Who can help you meet those needs? In what ways are you helping them prepare for bringing baby home? List some specific strategies you have for family adjustment. _______________________________________________________________________________________________________________________________________________________________________________________________________________________________________________________________________________________________________________________________________________  SUPPORT *Someone who can empathize with experiences normalizes your problems and makes them more bearable.* Make a list of other friends, neighbors, and/or co-workers you know with infants (and small children, if applicable) with whom you can connect. Make a list of local or online support groups, mom groups, etc. in which you can be  involved. ______________________________________________________________________________________________________________________________________________________________________________________________________________________________________________________________________________________________________________________________________________________________________________________________________  Childcare Plans Investigate and plan for childcare if mom is returning to work. Talk about mom's concerns about her transition back to work. Talk about partner's concerns regarding this transition.  Mental Health *Your mental health is one of the highest priorities for a pregnant or postpartum mom.* 1 in 5 women experience anxiety and/or depression from the time of conception through the first year after birth. Postpartum Mood Disorders are the #1 complication of pregnancy and childbirth and the suffering experienced by these mothers is not necessary! These illnesses are temporary and respond well to treatment, which often includes self-care, social support, talk therapy, and medication when needed. Women experiencing anxiety and depression often say things like: "I'm supposed to be happy.why do I feel so sad?", "Why can't I snap out of it?", "I'm having thoughts that scare me." There is no need to be embarrassed if you are feeling these symptoms: Overwhelmed, anxious, angry, sad, guilty, irritable, hopeless, exhausted but can't sleep You are NOT alone. You are NOT to blame. With help, you WILL be well. Where can I find help? Medical professionals such as your OB, midwife, gynecologist, family practitioner, primary care provider, pediatrician, or mental health providers; [facility] support groups: Feelings After Birth, Breastfeeding Support Group, Baby and Me Group, and Fit 4 Two exercise classes. You have permission to ask for help. It will confirm your feelings, validate your experiences,  share/learn coping strategies, and gain support and encouragement as you heal. You are important! BRAINSTORM Make a list of local resources, including resources for mom and for partner. Identify support groups. Identify people to call late at night - include names and contact info. Talk with partner about perinatal mood and anxiety disorders. Talk with your OB, midwife, and doula about baby blues and about perinatal mood and anxiety disorders. Talk with your pediatrician about perinatal mood and anxiety disorders.   Support & Sanity Savers   What do you really need?  Basics In preparing for a new baby, many expectant parents spend hours shopping for baby clothes, decorating the nursery, and deciding which car seat to buy. Yet most don't think much about what the reality of parenting a newborn will be like, and what they need to make it through that. So, here is the advice of experienced parents. We know you'll read this, and think "they're exaggerating,  I don't really need that." Just trust [place] on these, OK? Plan for all of this, and if it turns out you don't need it, come back and teach [place] how you did it!  Must-Haves (Once baby's survival needs are met, make sure you attend to your own survival needs!) Sleep An average newborn sleeps 16-18 hours per day, over 6-7 sleep periods, rarely more than three hours at a time. It is normal and healthy for a newborn to wake throughout the night... but really hard on parents!! Naps. Prioritize sleep above any responsibilities like: cleaning house, visiting friends, running errands, etc.  Sleep whenever baby sleeps. If you can't nap, at least have restful times when baby eats. The more rest you get, the more patient you will be, the more emotionally stable, and better at solving problems.  Food You may not have realized it would be difficult to eat when you have a newborn. Yet, when we talk to countless new parents, they say things like "it may be 2:00 pm  when I realize I haven't had breakfast yet." Or "every time we sit down to dinner, baby needs to eat, and my food gets cold, so I don't bother to eat it." Finger food. Before your baby is born, stock up with one months' worth of food that: 1) you can eat with one hand while holding a baby, 2) doesn't need to be prepped, 3) is good hot or cold, 4) doesn't spoil when left out for a few hours, and 5) you like to eat. Think about: nuts, dried fruit, Clif bars, pretzels, jerky, gogurt, baby carrots, apples, bananas, crackers, cheez-n-crackers, string cheese, hot pockets or frozen burritos to microwave, garden burgers and breakfast pastries to put in the toaster, yogurt drinks, etc. Restaurant Menus. Make lists of your favorite restaurants & menu items. When family/friends want to help, you can give specific information without much thought. They can either bring you the food or send gift cards for just the right meals. Freezer Meals.  Take some time to make a few meals to put in the freezer ahead of time.  Easy to freeze meals can be anything such as soup, lasagna, chicken pie, or spaghetti sauce. Set up a Meal Schedule.  Ask friends and family to sign up to bring you meals during the first few weeks of being home. (It can be passed around at baby showers!) You have no idea how helpful this will be until you are in the throes of parenting.  https://hamilton-woodard.com/ is a great website to check out. Emotional Support Know who to call when you're stressed out. Parenting a newborn is very challenging work. There are times when it totally overwhelms your normal coping abilities. EVERY NEW PARENT NEEDS TO HAVE A PLAN FOR WHO TO CALL WHEN THEY JUST CAN'T COPE ANY MORE. (And it has to be someone other than the baby's other parent!) Before your baby is born, come up with at least one person you can call for support - write their phone number down and post it on the refrigerator. Anxiety & Sadness. Baby blues are normal after  pregnancy; however, there are more severe types of anxiety & sadness which can occur and should not be ignored.  They are always treatable, but you have to take the first step by reaching out for help. [facility] offers a "Mom Talk" group which meets every Tuesday from 10 am - 11 am.  This group is for new moms who need support and connection  after their babies are born.  Call [phone].  Really, Really Helpful (Plan for them! Make sure these happen often!!) Physical Support with Taking Care of Yourselves Asking friends and family. Before your baby is born, set up a schedule of people who can come and visit and help out (or ask a friend to schedule for you). Any time someone says "let me know what I can do to help," sign them up for a day. When they get there, their job is not to take care of the baby (that's your job and your joy). Their job is to take care of you!  Postpartum doulas. If you don't have anyone you can call on for support, look into postpartum doulas:  professionals at helping parents with caring for baby, caring for themselves, getting breastfeeding started, and helping with household tasks. www.padanc.org is a helpful website for learning about doulas in our area. Peer Support / Parent Groups Why: One of the greatest ideas for new parents is to be around other new parents. Parent groups give you a chance to share and listen to others who are going through the same season of life, get a sense of what is normal infant development by watching several babies learn and grow, share your stories of triumph and struggles with empathetic ears, and forgive your own mistakes when you realize all parents are learning by trial and error. Where to find: There are many places you can meet other new parents throughout our community.  [facility] offers the following classes for new moms and their little ones:  Baby and Me (Birth to [place]) and Breastfeeding Support Group. Go to  www.conehealthybaby.com or call [phone] for more information. Time for your Relationship It's easy to get so caught up in meeting baby's immediate needs that it's hard to find time to connect with your partner, and meet the needs of your relationship. It's also easy to forget what "quality time with your partner" actually looks like. If you take your baby on a date, you'd be amazed how much of your couple time is spent feeding the baby, diapering the baby, admiring the baby, and talking about the baby. Dating: Try to take time for just the two of you. Babysitter tip: Sometimes when moms are breastfeeding a newborn, they find it hard to figure out how to schedule outings around baby's unpredictable feeding schedules. Have the babysitter come for a three hour period. When she comes over, if baby has just eaten, you can leave right away, and come back in two hours. If baby hasn't fed recently, you start the date at home. Once baby gets hungry and gets a good feeding in, you can head out for the rest of your date time. Date Nights at Home: If you can't get out, at least set aside one evening a week to prioritize your relationship: whenever baby dozes off or doesn't have any immediate needs, spend a little time focusing on each other. Potential conflicts: The main relationship conflicts that come up for new parents are: issues related to sexuality, financial stresses, a feeling of an unfair division of household tasks, and conflicts in parenting styles. The more you can work on these issues before baby arrives, the better!  Fun and Frills (Don't forget these. and don't feel guilty for indulging in them!) Everyone has something in life that is a fun little treat that they do just for themselves. It may be: reading the morning paper, or going for a daily jog, or having  coffee with a friend once a week, or going to a movie on Friday nights, or fine chocolates, or bubble baths, or curling up with a good  book. Unless you do fun things for yourself every now and then, it's hard to have the energy for fun with your baby. Whatever your "special" treats are, make sure you find a way to continue to indulge in them after your baby is born. These special moments can recharge you, and allow you to return to baby with a new joy   PERINATAL MOOD DISORDERS: [place]   Emergency and Crisis Resources:  If you are an imminent risk to self or others, are experiencing intense personal distress, and/or have noticed significant changes in activities of daily living, call:  [facility]: [place]: [place]: [phone] Or visit the following crisis centers: Local Emergency Departments [facility]: [address], [place]. Hours: 8:30AM-5PM. Insurance Accepted: Medicaid, Medicare, and Uninsured.  [facility]  [address] [place] Mon-Friday 8am-3pm  [phone]                                                                                    Non-Crisis Resources: To identify specific providers that are covered by your insurance, contact your insurance company or local agencies: [place]: [phone] [facility]: [phone] [place]: [phone] Postpartum Support International- [place] 1-([phone]                                                      Outpatient therapy and medication management providers:  [facility] [phone] Hours: 9AM-5PM  Insurance Accepted: Alben Spittle, Lorella Nimrod, Freddrick March, [place], Medicare  [facility] ([place]) [phone] Hours: 8AM-5PM  nsurance Accepted: All insurances EXCEPT AARP, [place], [place], and [place]: [phone]             Hours: 8AM-8PM Insurance  Accepted: Cristal Ford, Freddrick March, [place], Medicare, [id number] [facility]: [phone] Hours: 8:30AM-7PM Insurance Accepted: Cristal Ford, Medicaid, Medicare, Tricare, [employer] Counseling:  [place] Accepted:  Holland Falling, Lorella Nimrod, [facility], [place], [facility] [phone] Hours: 9AM-5:30PM Insurance Accepted: Alben Spittle, Charlotte Crumb, and Medicaid, Medicare, [employer] [facility]:  [phone] Hours: 9am-5pm Insurance Accepted: BCBS; they do not accept Medicaid/Medicare [facility] [place]: [phone] Hours: 9am-9pm Insurance Accepted: All major insurance including Medicaid and Medicare [facility]: [phone] Hours: 9AM-5:30PM Insurance Accepted: All insurances EXCEPT Medicaid and Medicare. [facility]: [phone]  Parenting Support Groups [facility]: [phone] [facility]:  [phone] Family Support Network (support for children in the NICU and/or with special needs), [phone]                                                                   Mental Health Support Groups Mental Health Association: [phone]                                                                                     Online Resources: Postpartum Support International: http://www.postpartum.net/  800-944-4PPD 2Moms Supporting Moms:  www.momssupportingmoms.net    

## 2021-07-12 MED ORDER — CLOTRIMAZOLE 3 2 % VA CREA: 1.0000 | TOPICAL_CREAM | Freq: Every day | VAGINAL | 0 refills | Status: DC

## 2021-07-12 NOTE — Addendum Note (Signed)
Addended by: Currie Paris on: 07/12/2021 09:44 AM   Modules accepted: Orders

## 2021-07-23 NOTE — BH Specialist Note (Signed)
Integrated Behavioral Health via Telemedicine Visit  07/23/2021 Anntoinette Haefele [id number]  Number of Integrated Behavioral Health visits: 2 Session Start time: 8:45  Session End time: 9:17 Total time:  32  Referring Provider: [name], NP Patient/Family location: Home [facility] Provider location: Center for [facility] at [facility]  All persons participating in visit: Patient Hannah Mccarthy and [facility] Makhai Fulco   Types of Service: Individual psychotherapy and Video visit  I connected with [name] and/or Jon Gills Jaggers's  n/a  via  Telephone or Video Enabled Telemedicine Application  (Video is Caregility application) and verified that I am speaking with the correct person using two identifiers. Discussed confidentiality: Yes   I discussed the limitations of telemedicine and the availability of in person appointments.  Discussed there is a possibility of technology failure and discussed alternative modes of communication if that failure occurs.  I discussed that engaging in this telemedicine visit, they consent to the provision of behavioral healthcare and the services will be billed under their insurance.  Patient and/or legal guardian expressed understanding and consented to Telemedicine visit: Yes   Presenting Concerns: Patient and/or family reports the following symptoms/concerns: Fatigue, anxiety, sleep difficulty, stress over work restrictions (affecting ability to pay for medication, obtain baby items, unable to pay for breastfeeding class); pt interested in baby being part of [facility] at [facility]. Appetite is improving. Duration of problem: Current pregnancy; Severity of problem: moderate  Patient and/or Family's Strengths/Protective Factors: Social connections and Sense of purpose  Goals Addressed: Patient will:  Reduce symptoms of: anxiety and stress   Increase knowledge and/or ability of: stress reduction   Demonstrate ability to: Increase  healthy adjustment to current life circumstances  Progress towards Goals: Ongoing  Interventions: Interventions utilized:  Solution-Focused Strategies Standardized Assessments completed: Not Needed  Patient and/or Family Response: Pt's appetite has improved; anxiety increased; agrees with treatment plan  Assessment: Patient currently experiencing Generalized anxiety disorder and Psychosocial stress.   Patient may benefit from continued psychoeducation and brief therapeutic interventions regarding coping with symptoms of anxiety and life stress .  Plan: Follow up with behavioral health clinician on : One month Behavioral recommendations:  -Continue taking prenatal vitamin and prioritizing healthy self-care daily (healthy sleeping and meals) -Consider engaging in enjoyable activities on days off, including going outside for at least 20 minutes (on good weather days)  -Consider registering for childbirth education class of choice at [email or web address]; inquire about breastfeeding support via [facility] Program -Consider reading through additional resources on After Visit Summary, as needed Referral(s): Integrated Art gallery manager (In Clinic) and [employer]:  Housing  I discussed the assessment and treatment plan with the patient and/or parent/guardian. They were provided an opportunity to ask questions and all were answered. They agreed with the plan and demonstrated an understanding of the instructions.   They were advised to call back or seek an in-person evaluation if the symptoms worsen or if the condition fails to improve as anticipated.  Rae Lips, LCSW  Depression screen [facility] 2/9 07/10/2021 06/11/2021 04/25/2021 04/23/2021 03/20/2021  Decreased Interest 1 1 0 0 0  Down, Depressed, Hopeless 1 1 0 0 0  PHQ - 2 Score 2 2 0 0 0  Altered sleeping 2 1 0 1 2  Tired, decreased energy 2 1 0 2 1  Change in appetite 2 1 3 2  0  Feeling bad or failure about yourself  0 1  0 0 0  Trouble concentrating 0 1 0 0  0  Moving slowly or fidgety/restless 0 1 0 0 0  Suicidal thoughts 0 1 0 0 0  PHQ-9 Score 8 9 3 5 3   Difficult doing work/chores - - - Not difficult at all -   GAD 7 : Generalized Anxiety Score 07/10/2021 06/11/2021 04/25/2021 04/23/2021  Nervous, Anxious, on Edge 2 1 0 1  Control/stop worrying 2 1 0 0  Worry too much - different things 2 1 0 0  Trouble relaxing 2 1 0 0  Restless 1 1 0 0  Easily annoyed or irritable 2 1 1 2   Afraid - awful might happen 1 1 0 0  Total GAD 7 Score 12 7 1  3

## 2021-07-30 ENCOUNTER — Ambulatory Visit: Payer: Medicaid Other | Admitting: *Deleted

## 2021-07-30 ENCOUNTER — Other Ambulatory Visit: Payer: Self-pay | Admitting: *Deleted

## 2021-07-30 ENCOUNTER — Other Ambulatory Visit: Payer: Self-pay

## 2021-07-30 ENCOUNTER — Ambulatory Visit: Payer: Medicaid Other | Attending: Neurology

## 2021-07-30 VITALS — BP 113/63 | HR 80

## 2021-07-30 DIAGNOSIS — O26832 Pregnancy related renal disease, second trimester: Secondary | ICD-10-CM

## 2021-07-30 DIAGNOSIS — N181 Chronic kidney disease, stage 1: Secondary | ICD-10-CM | POA: Insufficient documentation

## 2021-07-30 DIAGNOSIS — Z148 Genetic carrier of other disease: Secondary | ICD-10-CM | POA: Insufficient documentation

## 2021-07-30 DIAGNOSIS — Z362 Encounter for other antenatal screening follow-up: Secondary | ICD-10-CM | POA: Diagnosis not present

## 2021-07-30 DIAGNOSIS — Z3492 Encounter for supervision of normal pregnancy, unspecified, second trimester: Secondary | ICD-10-CM

## 2021-07-30 DIAGNOSIS — N809 Endometriosis, unspecified: Secondary | ICD-10-CM | POA: Insufficient documentation

## 2021-07-30 DIAGNOSIS — J45909 Unspecified asthma, uncomplicated: Secondary | ICD-10-CM | POA: Insufficient documentation

## 2021-07-30 DIAGNOSIS — Z3A23 23 weeks gestation of pregnancy: Secondary | ICD-10-CM | POA: Insufficient documentation

## 2021-07-30 DIAGNOSIS — N83209 Unspecified ovarian cyst, unspecified side: Secondary | ICD-10-CM | POA: Insufficient documentation

## 2021-07-30 DIAGNOSIS — O99512 Diseases of the respiratory system complicating pregnancy, second trimester: Secondary | ICD-10-CM | POA: Diagnosis not present

## 2021-08-01 ENCOUNTER — Ambulatory Visit (INDEPENDENT_AMBULATORY_CARE_PROVIDER_SITE_OTHER): Payer: Medicaid Other | Admitting: Clinical

## 2021-08-01 DIAGNOSIS — F411 Generalized anxiety disorder: Secondary | ICD-10-CM

## 2021-08-01 DIAGNOSIS — Z658 Other specified problems related to psychosocial circumstances: Secondary | ICD-10-CM

## 2021-08-01 NOTE — Patient Instructions (Addendum)
Center for [facility] Healthcare at [facility] for Women [address] [place], [place] [address] [phone] (main office) [phone] [facility] office)  [email or web address] Air cabin crew for childbirth education class)  Housing Resources                    [employer] (serves [place], [place], [place], [place], [place], [place], [place], [place], [place], [place], [place], [place], and [place] counties) [address] [place], [place] [address] [phone] DeveloperU.ch  **Rental assistance, Home Rehabilitation,Weatherization Assistance Program, Chief Financial Officer, Housing Voucher Program  [employer] for Housing and [employer] Studies: Proofreader Resources to residents of [place], [place], and [place] Make sure you have your documents ready, including:  (Household income verification: 2 months pay stubs, unemployment/social security award letter, statement of no income for all household members over 38) Photo ID for all household members over 18 Utility Bill/Rent Ledger/Lease: must show past due amount for utilities/rent, or the rental agreement if rent is current 2. Start your application online or by paper (in [place] or [place]) at:     [email or web address]  3. Once you have completed the online application, you will get an email confirmation message from the county. Expect to hear back by phone or email at least 6-10 weeks from submitting your application.  4. While you wait:  Call [phone] to check in on your application Let your landlord know that you've applied. Your landlord will be asked to submit documents (W-9) during this application process. Payments will be made directly to the landlord/property management company and utility company Rent or utility assistance for [employer], [employer], and [place] Apply at [email or web address] Questions? Call or email Renee at [phone] or [email or web address]   Eviction Mediation Program: The HOPE Program Https://www.[email or web address].cy HOPE Progam serves low-income renters in [address] [place] counties, defined as less than or equal to 80% of the area median income for the county where the renter lives. In the following 12 counties, you should apply to your local rent and utility assistance program INSTEAD OF the HOPE Program: [place], [place], [place], [place], [place], [place], [place], [place], [place], [place], [place], [place]  If you live outside of [place], contact HOPE call center at [phone] to talk to a Program Representative Monday-Friday, 8am-5pm Note that Native American tribes also received federal funding for rent and utility assistance programs. Recognized members of the following tribes will be served by programs managed by tribal governments, including: [place], [place], [place], [place] of [place] and [facility] Eviction Mediation Coordinator, [place], [phone] [email or web address] .[employer], [place], [phone] [email or web address]   Housing Resources [facility] [place] [address], [place], [place] [address] [phone] www.gha-[place].Advanced [employer] [address] [place], [place] [address] [phone] PhoneCaptions.ch **Programs include: Hospital doctor and Housing Counseling, Healthy Radiographer, therapeutic, Homeless Prevention and Housing Assistance  Government [facility] [address], [place], [place] [address] [phone] www.PaintingEmporium.co.za **housing applications/recertification; tax payment relief/exemption under specific qualifications  [facility] [address] [place], [place]  [address] www.onlinegreensboro.com/~maryshouse **transitional housing for women in recovery who have minor children or are pregnant  [facility] [address] [place], [place], [place] [address] https://johnson-smith.[email or web address]  **emergency shelter and support services for families facing homelessness  [employer] [address], [place], [place] [address] [phone] www.youthfocus.org **transitional housing to pregnant women; emergency housing for youth who  have run away, are experiencing a family crisis, are victims of abuse or neglect, or are homeless  [employer] [address] [place], [place] [address] [phone] ircgso.org **Drop-in center for people experiencing homelessness; overnight warming center when temperature is 25 degrees or below  [employer] [address] [place], [place], [place] [address] [phone] https://reentrystaffingagency.org/ **help with affordable housing to people experiencing homelessness or unemployment due to incarceration  [facility] [address], [place], [place] [address] [phone] www.greensborourbanministry.org  **emergency and transitional housing, rent/mortgage assistance, utility assistance  [facility] [address], [place], [place] [address] [phone] www.salvationarmyofgreensboro.org **emergency and transitional housing  [employer] for [employer] [address], [place], [place] [address] [phone] Www.habitatgreensboro.Pam [facility]   [employer] [address], [place], [place] [address] [phone] https://chshousing.org **Home Ownership/Affordable Housing Program and [facility]  [employer] [address], [place], [place] [address] [phone] PaidValue.com.cy **home buyer education courses, foreclosure prevention  [facility] [address], [place], [place] [address] [phone] DefMagazine.is **Environmental Exposure Assessment (investigation of homes where either children or pregnant women with a confirmed elevated blood lead level reside)  [facility] of [facility] [address] [place], [place] [address] [phone] ShowReturn.ca **Home Expense Assistance/Repairs Program; offers home accessibility updates, such as ramps or bars in the bathroom  [employer] [address], [place], [place] [address] [phone] https://www.self-help.org/locations/[place]-branch **Offers credit-building and banking services to people unable to use traditional banking

## 2021-08-06 ENCOUNTER — Other Ambulatory Visit: Payer: Self-pay

## 2021-08-06 ENCOUNTER — Encounter: Payer: Self-pay | Admitting: Obstetrics and Gynecology

## 2021-08-06 ENCOUNTER — Ambulatory Visit (INDEPENDENT_AMBULATORY_CARE_PROVIDER_SITE_OTHER): Payer: Medicaid Other | Admitting: Obstetrics and Gynecology

## 2021-08-06 VITALS — BP 136/68 | HR 89 | Wt 149.3 lb

## 2021-08-06 DIAGNOSIS — Z3492 Encounter for supervision of normal pregnancy, unspecified, second trimester: Secondary | ICD-10-CM

## 2021-08-06 DIAGNOSIS — N289 Disorder of kidney and ureter, unspecified: Secondary | ICD-10-CM

## 2021-08-06 DIAGNOSIS — D573 Sickle-cell trait: Secondary | ICD-10-CM

## 2021-08-06 NOTE — Progress Notes (Signed)
Subjective:  Hannah Mccarthy is a 20 y.o. G1P0 at [redacted]w[redacted]d being seen today for ongoing prenatal care.  She is currently monitored for the following issues for this high-risk pregnancy and has Proteinuria; Supervision of low-risk pregnancy; GAD (generalized anxiety disorder); Renal disorder; Endometriosis; Asthma; and Sickle cell trait (HCC) on their problem list.  Patient reports no complaints.  Contractions: Not present. Vag. Bleeding: None.  Movement: Present. Denies leaking of fluid.   The following portions of the patient's history were reviewed and updated as appropriate: allergies, current medications, past family history, past medical history, past social history, past surgical history and problem list. Problem list updated.  Objective:   Vitals:   08/06/21 1057  BP: 136/68  Pulse: 89  Weight: 149 lb 4.8 oz (67.7 kg)    Fetal Status: Fetal Heart Rate (bpm): 149   Movement: Present     General:  Alert, oriented and cooperative. Patient is in no acute distress.  Skin: Skin is warm and dry. No rash noted.   Cardiovascular: Normal heart rate noted  Respiratory: Normal respiratory effort, no problems with respiration noted  Abdomen: Soft, gravid, appropriate for gestational age. Pain/Pressure: Present     Pelvic:  Cervical exam deferred        Extremities: Normal range of motion.  Edema: None  Mental Status: Normal mood and affect. Normal behavior. Normal judgment and thought content.   Urinalysis:      Assessment and Plan:  Pregnancy: G1P0 at [redacted]w[redacted]d  1. Encounter for supervision of low-risk pregnancy in second trimester Stable Glucola next visit  2. Sickle cell trait (HCC) UC today  3. Renal disorder Stable Serial U/S and antenatal testing as per MFM  Preterm labor symptoms and general obstetric precautions including but not limited to vaginal bleeding, contractions, leaking of fluid and fetal movement were reviewed in detail with the patient. Please refer to After Visit  Summary for other counseling recommendations.  Return in about 4 weeks (around 09/03/2021) for OB visit, face to face, any provider, fasting appt for Glucola.   Hermina Staggers, MD

## 2021-08-06 NOTE — Patient Instructions (Signed)

## 2021-08-16 NOTE — BH Specialist Note (Signed)
Integrated Behavioral Health via Telemedicine Visit  08/16/2021 Hannah Mccarthy [id number]  Number of Integrated Behavioral Health visits: 3 Session Start time: 8:47  Session End time: 9:02 Total time: 15  Referring Provider: [name], NP Patient/Family location: Home [name] Provider location: Center for [facility] at [facility]  All persons participating in visit: Patient Hannah Mccarthy and [name] Hannah Mccarthy   Types of Service: Individual psychotherapy and Video visit  I connected with Hannah Mccarthy and/or Hannah Mccarthy's  n/a  via  Telephone or Video Enabled Telemedicine Application  (Video is Caregility application) and verified that I am speaking with the correct person using two identifiers. Discussed confidentiality: Yes   I discussed the limitations of telemedicine and the availability of in person appointments.  Discussed there is a possibility of technology failure and discussed alternative modes of communication if that failure occurs.  I discussed that engaging in this telemedicine visit, they consent to the provision of behavioral healthcare and the services will be billed under their insurance.  Patient and/or legal guardian expressed understanding and consented to Telemedicine visit: Yes   Presenting Concerns: Patient and/or family reports the following symptoms/concerns: Sleep difficulty attributed to leg cramping, shoulder twitching, and baby movements with discomfort at night; no other concerns at this time; feels anxiety is manageable at this time.  Duration of problem: Current pregnancy; Severity of problem: mild  Patient and/or Family's Strengths/Protective Factors: Social connections and Sense of purpose  Goals Addressed: Patient will:  Reduce symptoms of: anxiety   Increase knowledge and/or ability of: healthy habits   Demonstrate ability to: Increase motivation to adhere to plan of care  Progress towards  Goals: Ongoing  Interventions: Interventions utilized:  Sleep Hygiene and Psychoeducation and/or Health Education Standardized Assessments completed: Not Needed  Patient and/or Family Response: Pt agrees with treatment plan  Assessment: Patient currently experiencing Generalized anxiety disorder.   Patient may benefit from continued psychoeducation and brief therapeutic interventions regarding coping with symptoms of anxiety .  Plan: Follow up with behavioral health clinician on : Two months; Call Hannah Mccarthy at [phone], as needed. Behavioral recommendations:  -Continue taking prenatal vitamin daily -Continue with plan to apply for car seat/playpen at 36 weeks at [employer] for Women -Continue plan to stay with mom for support in early postpartum time -Continue to consider registering for childbirth education class of choice -Read through Postpartum Planner (on After Visit Summary) -Discuss options to manage leg cramping with medical provider at upcoming visit (in the meantime, may use strategy discussed, with scarf, etc to help ease discomfort temporarily)   Referral(s): Integrated [employer] (In Clinic)  I discussed the assessment and treatment plan with the patient and/or parent/guardian. They were provided an opportunity to ask questions and all were answered. They agreed with the plan and demonstrated an understanding of the instructions.   They were advised to call back or seek an in-person evaluation if the symptoms worsen or if the condition fails to improve as anticipated.  Hannah Lips, LCSW  Depression screen [facility] 2/9 08/06/2021 07/10/2021 06/11/2021 04/25/2021 04/23/2021  Decreased Interest 2 1 1  0 0  Down, Depressed, Hopeless 1 1 1  0 0  PHQ - 2 Score 3 2 2  0 0  Altered sleeping 2 2 1  0 1  Tired, decreased energy 2 2 1  0 2  Change in appetite 0 2 1 3 2   Feeling bad or failure about yourself  0 0 1 0 0  Trouble concentrating 0 0 1  0 0  Moving slowly or  fidgety/restless 0 0 1 0 0  Suicidal thoughts 0 0 1 0 0  PHQ-9 Score 7 8 9 3 5   Difficult doing work/chores - - - - Not difficult at all   GAD 7 : Generalized Anxiety Score 08/06/2021 07/10/2021 06/11/2021 04/25/2021  Nervous, Anxious, on Edge 1 2 1  0  Control/stop worrying 1 2 1  0  Worry too much - different things 2 2 1  0  Trouble relaxing 1 2 1  0  Restless 0 1 1 0  Easily annoyed or irritable 2 2 1 1   Afraid - awful might happen 0 1 1 0  Total GAD 7 Score 7 12 7  1

## 2021-08-29 ENCOUNTER — Ambulatory Visit: Payer: Medicaid Other | Admitting: Clinical

## 2021-08-29 DIAGNOSIS — Z658 Other specified problems related to psychosocial circumstances: Secondary | ICD-10-CM

## 2021-08-29 DIAGNOSIS — F411 Generalized anxiety disorder: Secondary | ICD-10-CM

## 2021-08-29 NOTE — Patient Instructions (Signed)
[facility] [address] [place], [place] [address] [phone] (main office) [phone] (Jahnasia Tatum's office)     BRAINSTORMING  Develop a Plan Goals: Provide a way to start conversation about your new life with a baby Assist parents in recognizing and using resources within their reach Help pave the way before birth for an easier period of transition afterwards.  Make a list of the following information to keep in a central location: Full name of Mom and Partner: _____________________________________________ Baby's full name and Date of Birth: ___________________________________________ Home Address: ___________________________________________________________ ________________________________________________________________________ Home Phone: ____________________________________________________________ Parents' cell numbers: _____________________________________________________ ________________________________________________________________________ Name and contact info for OB: ______________________________________________ Name and contact info for Pediatrician:________________________________________ Contact info for Lactation Consultants: ________________________________________  REST and SLEEP *You each need at least 4-5 hours of uninterrupted sleep every day. Write specific names and contact information.* How are you going to rest in the postpartum period? While partner's home? When partner returns to work? When you both return to work? Where will your baby sleep? Who is available to help during the day? Evening? Night? Who could move in for a period to help support you? What are some ideas to help you get enough  sleep? __________________________________________________________________________________________________________________________________________________________________________________________________________________________________________ NUTRITIOUS FOOD AND DRINK *Plan for meals before your baby is born so you can have healthy food to eat during the immediate postpartum period.* Who will look after breakfast? Lunch? Dinner? List names and contact information. Brainstorm quick, healthy ideas for each meal. What can you do before baby is born to prepare meals for the postpartum period? How can others help you with meals? Which grocery stores provide online shopping and delivery? Which restaurants offer take-out or delivery options? ______________________________________________________________________________________________________________________________________________________________________________________________________________________________________________________________________________________________________________________________________________________________________________________________________  CARE FOR MOM *It's important that mom is cared for and pampered in the postpartum period. Remember, the most important ways new mothers need care are: sleep, nutrition, gentle exercise, and time off.* Who can come take care of mom during this period? Make a list of people with their contact information. List some activities that make you feel cared for, rested, and energized? Who can make sure you have opportunities to do these things? Does mom have a space of her very own within your home that's just for her? Make a "Mama Cave" where she can be comfortable, rest, and renew herself  daily. ______________________________________________________________________________________________________________________________________________________________________________________________________________________________________________________________________________________________________________________________________________________________________________________________________    CARE FOR AND FEEDING BABY *Knowledgeable and encouraging people will offer the best support with regard to feeding your baby.* Educate yourself and choose the best feeding option for your baby. Make a list of people who will guide, support, and be a resource for you as your care for and feed your baby. (Friends that have breastfed or are currently breastfeeding, lactation consultants, breastfeeding support groups, etc.) Consider a postpartum doula. (These websites can give you information: dona.org & padanc.org) Seek out local breastfeeding resources like the breastfeeding support group at [facility] or [employer]. ______________________________________________________________________________________________________________________________________________________________________________________________________________________________________________________________________________________________________________________________________________________________________________________________________  CHORES AND ERRANDS Who can help with a thorough cleaning before baby is born? Make a list of people who will help with housekeeping and chores, like laundry, light cleaning, dishes, bathrooms, etc. Who can run some errands for you? What can you do to make sure you are stocked with basic supplies before baby is born? Who is going to do the  shopping? ______________________________________________________________________________________________________________________________________________________________________________________________________________________________________________________________________________________________________________________________________________________________________________________________________     Family Adjustment *Nurture yourselves.it helps parents be more loving and allows for better bonding with their child.* What sorts of things do you and partner enjoy   doing together? Which activities help you to connect and strengthen your relationship? Make a list of those things. Make a list of people whom you trust to care for your baby so you can have some time together as a couple. What types of things help partner feel connected to Mom? Make a list. What needs will partner have in order to bond with baby? Other children? Who will care for them when you go into labor and while you are in the hospital? Think about what the needs of your older children might be. Who can help you meet those needs? In what ways are you helping them prepare for bringing baby home? List some specific strategies you have for family adjustment. _______________________________________________________________________________________________________________________________________________________________________________________________________________________________________________________________________________________________________________________________________________  SUPPORT *Someone who can empathize with experiences normalizes your problems and makes them more bearable.* Make a list of other friends, neighbors, and/or co-workers you know with infants (and small children, if applicable) with whom you can connect. Make a list of local or online support groups, mom groups, etc. in which you can be  involved. ______________________________________________________________________________________________________________________________________________________________________________________________________________________________________________________________________________________________________________________________________________________________________________________________________  Childcare Plans Investigate and plan for childcare if mom is returning to work. Talk about mom's concerns about her transition back to work. Talk about partner's concerns regarding this transition.  Mental Health *Your mental health is one of the highest priorities for a pregnant or postpartum mom.* 1 in 5 women experience anxiety and/or depression from the time of conception through the first year after birth. Postpartum Mood Disorders are the #1 complication of pregnancy and childbirth and the suffering experienced by these mothers is not necessary! These illnesses are temporary and respond well to treatment, which often includes self-care, social support, talk therapy, and medication when needed. Women experiencing anxiety and depression often say things like: "I'm supposed to be happy.why do I feel so sad?", "Why can't I snap out of it?", "I'm having thoughts that scare me." There is no need to be embarrassed if you are feeling these symptoms: Overwhelmed, anxious, angry, sad, guilty, irritable, hopeless, exhausted but can't sleep You are NOT alone. You are NOT to blame. With help, you WILL be well. Where can I find help? Medical professionals such as your OB, midwife, gynecologist, family practitioner, primary care provider, pediatrician, or mental health providers; [facility] support groups: Feelings After Birth, Breastfeeding Support Group, Baby and Me Group, and Fit 4 Two exercise classes. You have permission to ask for help. It will confirm your feelings, validate your experiences,  share/learn coping strategies, and gain support and encouragement as you heal. You are important! BRAINSTORM Make a list of local resources, including resources for mom and for partner. Identify support groups. Identify people to call late at night - include names and contact info. Talk with partner about perinatal mood and anxiety disorders. Talk with your OB, midwife, and doula about baby blues and about perinatal mood and anxiety disorders. Talk with your pediatrician about perinatal mood and anxiety disorders.   Support & Sanity Savers   What do you really need?  Basics In preparing for a new baby, many expectant parents spend hours shopping for baby clothes, decorating the nursery, and deciding which car seat to buy. Yet most don't think much about what the reality of parenting a newborn will be like, and what they need to make it through that. So, here is the advice of experienced parents. We know you'll read this, and think "they're exaggerating, I don't really need that." Just trust us on these, OK? Plan for all of   this, and if it turns out you don't need it, come back and teach us how you did it!  Must-Haves (Once baby's survival needs are met, make sure you attend to your own survival needs!) Sleep An average newborn sleeps 16-18 hours per day, over 6-7 sleep periods, rarely more than three hours at a time. It is normal and healthy for a newborn to wake throughout the night... but really hard on parents!! Naps. Prioritize sleep above any responsibilities like: cleaning house, visiting friends, running errands, etc.  Sleep whenever baby sleeps. If you can't nap, at least have restful times when baby eats. The more rest you get, the more patient you will be, the more emotionally stable, and better at solving problems.  Food You may not have realized it would be difficult to eat when you have a newborn. Yet, when we talk to countless new parents, they say things like "it may be 2:00 pm  when I realize I haven't had breakfast yet." Or "every time we sit down to dinner, baby needs to eat, and my food gets cold, so I don't bother to eat it." Finger food. Before your baby is born, stock up with one months' worth of food that: 1) you can eat with one hand while holding a baby, 2) doesn't need to be prepped, 3) is good hot or cold, 4) doesn't spoil when left out for a few hours, and 5) you like to eat. Think about: nuts, dried fruit, Clif bars, pretzels, jerky, gogurt, baby carrots, apples, bananas, crackers, cheez-n-crackers, string cheese, hot pockets or frozen burritos to microwave, garden burgers and breakfast pastries to put in the toaster, yogurt drinks, etc. Restaurant Menus. Make lists of your favorite restaurants & menu items. When family/friends want to help, you can give specific information without much thought. They can either bring you the food or send gift cards for just the right meals. Freezer Meals.  Take some time to make a few meals to put in the freezer ahead of time.  Easy to freeze meals can be anything such as soup, lasagna, chicken pie, or spaghetti sauce. Set up a Meal Schedule.  Ask friends and family to sign up to bring you meals during the first few weeks of being home. (It can be passed around at baby showers!) You have no idea how helpful this will be until you are in the throes of parenting.  www.takethemameal.com is a great website to check out. Emotional Support Know who to call when you're stressed out. Parenting a newborn is very challenging work. There are times when it totally overwhelms your normal coping abilities. EVERY NEW PARENT NEEDS TO HAVE A PLAN FOR WHO TO CALL WHEN THEY JUST CAN'T COPE ANY MORE. (And it has to be someone other than the baby's other parent!) Before your baby is born, come up with at least one person you can call for support - write their phone number down and post it on the refrigerator. Anxiety & Sadness. Baby blues are normal after  pregnancy; however, there are more severe types of anxiety & sadness which can occur and should not be ignored.  They are always treatable, but you have to take the first step by reaching out for help. [facility] offers a "Mom Talk" group which meets every Tuesday from 10 am - 11 am.  This group is for new moms who need support and connection after their babies are born.  Call [phone].  Really, Really Helpful (Plan for them!   Make sure these happen often!!) Physical Support with Taking Care of Yourselves Asking friends and family. Before your baby is born, set up a schedule of people who can come and visit and help out (or ask a friend to schedule for you). Any time someone says "let me know what I can do to help," sign them up for a day. When they get there, their job is not to take care of the baby (that's your job and your joy). Their job is to take care of you!  Postpartum doulas. If you don't have anyone you can call on for support, look into postpartum doulas:  professionals at helping parents with caring for baby, caring for themselves, getting breastfeeding started, and helping with household tasks. www.padanc.org is a helpful website for learning about doulas in our area. Peer Support / Parent Groups Why: One of the greatest ideas for new parents is to be around other new parents. Parent groups give you a chance to share and listen to others who are going through the same season of life, get a sense of what is normal infant development by watching several babies learn and grow, share your stories of triumph and struggles with empathetic ears, and forgive your own mistakes when you realize all parents are learning by trial and error. Where to find: There are many places you can meet other new parents throughout our community.  [facility] offers the following classes for new moms and their little ones:  Baby and Me (Birth to Crawling) and Breastfeeding Support Group. Go to  www.conehealthybaby.com or call [phone] for more information. Time for your Relationship It's easy to get so caught up in meeting baby's immediate needs that it's hard to find time to connect with your partner, and meet the needs of your relationship. It's also easy to forget what "quality time with your partner" actually looks like. If you take your baby on a date, you'd be amazed how much of your couple time is spent feeding the baby, diapering the baby, admiring the baby, and talking about the baby. Dating: Try to take time for just the two of you. Babysitter tip: Sometimes when moms are breastfeeding a newborn, they find it hard to figure out how to schedule outings around baby's unpredictable feeding schedules. Have the babysitter come for a three hour period. When she comes over, if baby has just eaten, you can leave right away, and come back in two hours. If baby hasn't fed recently, you start the date at home. Once baby gets hungry and gets a good feeding in, you can head out for the rest of your date time. Date Nights at Home: If you can't get out, at least set aside one evening a week to prioritize your relationship: whenever baby dozes off or doesn't have any immediate needs, spend a little time focusing on each other. Potential conflicts: The main relationship conflicts that come up for new parents are: issues related to sexuality, financial stresses, a feeling of an unfair division of household tasks, and conflicts in parenting styles. The more you can work on these issues before baby arrives, the better!  Fun and Frills (Don't forget these. and don't feel guilty for indulging in them!) Everyone has something in life that is a fun little treat that they do just for themselves. It may be: reading the morning paper, or going for a daily jog, or having coffee with a friend once a week, or going to a movie on Friday nights,   or fine chocolates, or bubble baths, or curling up with a good  book. Unless you do fun things for yourself every now and then, it's hard to have the energy for fun with your baby. Whatever your "special" treats are, make sure you find a way to continue to indulge in them after your baby is born. These special moments can recharge you, and allow you to return to baby with a new joy   PERINATAL MOOD DISORDERS: [place]   _________________________________________Emergency and Crisis Resources If you are an imminent risk to self or others, are experiencing intense personal distress, and/or have noticed significant changes in activities of daily living, call:  [facility]: [phone]  [address] [place], [place], [address] Mobile Crisis: [place]: 988 Or visit the following crisis centers: Local Emergency Departments [facility]: [address], [place]. Hours: 8:30AM-5PM. Insurance Accepted: Medicaid, Medicare, and Uninsured.  [facility]:  [address], [place]  Mon-Friday 8am-3pm, [phone]                                                                                  ___________ Non-Crisis Resources To identify specific providers that are covered by your insurance, contact your insurance company or local agencies:  [place]: [phone] [facility] [facility] [employer]: [place]: [phone] Postpartum Support International- Warm-line: (239)374-1098                                                      __Outpatient Therapy and Medication Management   Providers:  [facility]: [phone] Hours: 9AM-5PM  Insurance Accepted: Alben Spittle, Shane Crutch, [place], [place] [facility]): [phone] Hours: 8AM-5:30PM  nsurance Accepted: All insurances EXCEPT AARP, [place],  [place], and [place]: [phone] Hours: 8AM-8PM Insurance Accepted: Cristal Ford, Freddrick March, [place], Medicare, [phone] [facility]: [phone] Hours: 8:30AM-7PM Insurance Accepted: Cristal Ford, Medicaid, Medicare, Tricare, [employer] Counseling:  [place] Accepted:  Holland Falling, Lorella Nimrod, [employer], [place]: [phone] Hours: 9AM-5:30PM Insurance Accepted: Alben Spittle, Charlotte Crumb, and Medicaid, Medicare, [facility]:  [phone] Hours: 9am-5pm Insurance Accepted: BCBS; they do not accept Medicaid/Medicare [facility] [place]: [phone] Hours: 9am-9pm Insurance Accepted: All major insurance including Medicaid and Medicare [facility]: [phone] Hours: [place] Accepted: All insurances EXCEPT Medicaid and Medicare. [facility]: [phone]   ____________  Parenting Support Groups [facility]: [phone] [facility]:  [phone] Family Support Network: (support for children in the NICU and/or with special needs), [phone]   ___________                                                                 Mental Health Support Groups Mental Health Association: [phone]    _____________                                                                                  Online Resources Postpartum Support International: http://www.postpartum.net/  800-944-4PPD 2Moms Supporting Moms:  www.momssupportingmoms.net    

## 2021-08-30 ENCOUNTER — Encounter: Payer: Self-pay | Admitting: *Deleted

## 2021-08-30 ENCOUNTER — Ambulatory Visit: Payer: Self-pay

## 2021-09-03 ENCOUNTER — Other Ambulatory Visit: Payer: Self-pay

## 2021-09-03 ENCOUNTER — Ambulatory Visit: Payer: Medicaid Other | Attending: Obstetrics and Gynecology

## 2021-09-03 ENCOUNTER — Other Ambulatory Visit: Payer: Self-pay | Admitting: *Deleted

## 2021-09-03 ENCOUNTER — Ambulatory Visit: Payer: Medicaid Other | Admitting: *Deleted

## 2021-09-03 VITALS — BP 118/57 | HR 71

## 2021-09-03 DIAGNOSIS — O26833 Pregnancy related renal disease, third trimester: Secondary | ICD-10-CM

## 2021-09-03 DIAGNOSIS — Z3689 Encounter for other specified antenatal screening: Secondary | ICD-10-CM

## 2021-09-03 DIAGNOSIS — Z362 Encounter for other antenatal screening follow-up: Secondary | ICD-10-CM

## 2021-09-03 DIAGNOSIS — O26832 Pregnancy related renal disease, second trimester: Secondary | ICD-10-CM

## 2021-09-03 DIAGNOSIS — Z3A28 28 weeks gestation of pregnancy: Secondary | ICD-10-CM

## 2021-09-05 ENCOUNTER — Ambulatory Visit (INDEPENDENT_AMBULATORY_CARE_PROVIDER_SITE_OTHER): Payer: Medicaid Other

## 2021-09-05 ENCOUNTER — Other Ambulatory Visit: Payer: Self-pay | Admitting: *Deleted

## 2021-09-05 ENCOUNTER — Other Ambulatory Visit: Payer: Self-pay

## 2021-09-05 ENCOUNTER — Other Ambulatory Visit: Payer: Medicaid Other

## 2021-09-05 VITALS — BP 128/77 | HR 92 | Wt 152.0 lb

## 2021-09-05 DIAGNOSIS — F419 Anxiety disorder, unspecified: Secondary | ICD-10-CM

## 2021-09-05 DIAGNOSIS — N289 Disorder of kidney and ureter, unspecified: Secondary | ICD-10-CM

## 2021-09-05 DIAGNOSIS — Z3490 Encounter for supervision of normal pregnancy, unspecified, unspecified trimester: Secondary | ICD-10-CM

## 2021-09-05 DIAGNOSIS — Z3A28 28 weeks gestation of pregnancy: Secondary | ICD-10-CM

## 2021-09-05 DIAGNOSIS — Z23 Encounter for immunization: Secondary | ICD-10-CM

## 2021-09-05 DIAGNOSIS — D573 Sickle-cell trait: Secondary | ICD-10-CM

## 2021-09-05 NOTE — Progress Notes (Signed)
   PRENATAL VISIT NOTE  Subjective:  Hannah Mccarthy is a 20 y.o. G2P0 at [redacted]w[redacted]d being seen today for ongoing prenatal care.  She is currently monitored for the following issues for this low-risk pregnancy and has Proteinuria; Supervision of low-risk pregnancy; GAD (generalized anxiety disorder); Renal disorder; Endometriosis; Asthma; and Sickle cell trait (HCC) on their problem list.  Patient reports no complaints.  Contractions: Irritability. Vag. Bleeding: None.  Movement: Present. Denies leaking of fluid.   The following portions of the patient's history were reviewed and updated as appropriate: allergies, current medications, past family history, past medical history, past social history, past surgical history and problem list.   Objective:   Vitals:   09/05/21 0928  BP: 128/77  Pulse: 92  Weight: 152 lb (68.9 kg)    Fetal Status: Fetal Heart Rate (bpm): 154 Fundal Height: 28 cm Movement: Present     General:  Alert, oriented and cooperative. Patient is in no acute distress.  Skin: Skin is warm and dry. No rash noted.   Cardiovascular: Normal heart rate noted  Respiratory: Normal respiratory effort, no problems with respiration noted  Abdomen: Soft, gravid, appropriate for gestational age.  Pain/Pressure: Present     Pelvic: Cervical exam deferred        Extremities: Normal range of motion.  Edema: None  Mental Status: Normal mood and affect. Normal behavior. Normal judgment and thought content.   Assessment and Plan:  Pregnancy: G2P0 at [redacted]w[redacted]d 1. Encounter for supervision of low-risk pregnancy, antepartum - Routine OB care. GTT and labs today - Doing well. No concerns - Anticipatory guidance for upcoming appointments provided  2. Sickle cell trait (HCC)   3. Renal disorder - Stable - Serial [place] and antenatal testing per MFM - bASA  4. Anxiety - Stable  Preterm labor symptoms and general obstetric precautions including but not limited to vaginal bleeding, contractions,  leaking of fluid and fetal movement were reviewed in detail with the patient. Please refer to After Visit Summary for other counseling recommendations.   Return in about 2 weeks (around 09/19/2021).  Future Appointments  Date Time Provider Department Center  09/23/2021  3:55 PM [facility]  10/01/2021  9:45 AM [facility] NURSE [facility]  10/01/2021 10:00 AM [facility] US1 [facility]  10/29/2021  8:45 AM [facility]  10/29/2021  9:45 AM [facility] NURSE [facility]  10/29/2021 10:00 AM [facility] US1 [facility]     Brand Males, CNM 09/05/21 11:59 AM

## 2021-09-06 LAB — CBC
Hematocrit: 37.2 % (ref 34.0–46.6)
Hemoglobin: 12.6 g/dL (ref 11.1–15.9)
MCH: 27.6 pg (ref 26.6–33.0)
MCHC: 33.9 g/dL (ref 31.5–35.7)
MCV: 81 fL (ref 79–97)
Platelets: 259 10*3/uL (ref 150–450)
RBC: 4.57 x10E6/uL (ref 3.77–5.28)
RDW: 12.1 % (ref 11.7–15.4)
WBC: 6.1 10*3/uL (ref 3.4–10.8)

## 2021-09-06 LAB — HIV ANTIBODY (ROUTINE TESTING W REFLEX): HIV Screen 4th Generation wRfx: NONREACTIVE

## 2021-09-06 LAB — GLUCOSE TOLERANCE, 2 HOURS W/ 1HR
Glucose, 1 hour: 107 mg/dL (ref 70–179)
Glucose, 2 hour: 89 mg/dL (ref 70–152)
Glucose, Fasting: 75 mg/dL (ref 70–91)

## 2021-09-06 LAB — RPR: RPR Ser Ql: NONREACTIVE

## 2021-09-23 ENCOUNTER — Other Ambulatory Visit: Payer: Self-pay

## 2021-09-23 ENCOUNTER — Ambulatory Visit (INDEPENDENT_AMBULATORY_CARE_PROVIDER_SITE_OTHER): Payer: Medicaid Other

## 2021-09-23 VITALS — BP 118/72 | HR 91 | Wt 159.0 lb

## 2021-09-23 DIAGNOSIS — N289 Disorder of kidney and ureter, unspecified: Secondary | ICD-10-CM

## 2021-09-23 DIAGNOSIS — Z3490 Encounter for supervision of normal pregnancy, unspecified, unspecified trimester: Secondary | ICD-10-CM

## 2021-09-23 NOTE — Progress Notes (Signed)
   PRENATAL VISIT NOTE  Subjective:  Hannah Mccarthy is a 20 y.o. G2P0 at [redacted]w[redacted]d being seen today for ongoing prenatal care. She is currently monitored for the following issues for this low-risk pregnancy and has Proteinuria; Supervision of low-risk pregnancy; GAD (generalized anxiety disorder); Renal disorder; Endometriosis; Asthma; and Sickle cell trait (HCC) on their problem list.  Patient reports no complaints.  Contractions: Irritability. Vag. Bleeding: None.  Movement: Present. Denies leaking of fluid.   The following portions of the patient's history were reviewed and updated as appropriate: allergies, current medications, past family history, past medical history, past social history, past surgical history and problem list.   Objective:   Vitals:   09/23/21 1620  BP: 118/72  Pulse: 91  Weight: 159 lb (72.1 kg)    Fetal Status: Fetal Heart Rate (bpm): 158 Fundal Height: 31 cm Movement: Present     General:  Alert, oriented and cooperative. Patient is in no acute distress.  Skin: Skin is warm and dry. No rash noted.   Cardiovascular: Normal heart rate noted  Respiratory: Normal respiratory effort, no problems with respiration noted  Abdomen: Soft, gravid, appropriate for gestational age.  Pain/Pressure: Present     Pelvic: Cervical exam deferred        Extremities: Normal range of motion.     Mental Status: Normal mood and affect. Normal behavior. Normal judgment and thought content.   Assessment and Plan:  Pregnancy: G2P0 at [redacted]w[redacted]d 1. Encounter for supervision of low-risk pregnancy, antepartum - Routine OB. Doing well  2. Renal disorder - Stable - [place] on 11/29 - bASA daily  3. Anxiety - Stable  Preterm labor symptoms and general obstetric precautions including but not limited to vaginal bleeding, contractions, leaking of fluid and fetal movement were reviewed in detail with the patient. Please refer to After Visit Summary for other counseling recommendations.   Return  in about 2 weeks (around 10/07/2021).  Future Appointments  Date Time Provider Department Center  10/01/2021  9:45 AM [facility] NURSE [facility]  10/01/2021 10:00 AM [facility]1 [facility]  10/10/2021  3:15 PM Adam Phenix, MD [facility]  10/29/2021  8:45 AM [facility] CLINIC[facility]  10/29/2021  9:45 AM [facility] NURSE [facility]  10/29/2021 10:00 AM [facility] US1 [facility]    Brand Males, CNM 09/23/21 7:39 PM

## 2021-10-01 ENCOUNTER — Ambulatory Visit: Payer: Medicaid Other | Attending: Maternal & Fetal Medicine

## 2021-10-01 ENCOUNTER — Ambulatory Visit: Payer: Medicaid Other | Admitting: *Deleted

## 2021-10-01 ENCOUNTER — Other Ambulatory Visit: Payer: Self-pay

## 2021-10-01 VITALS — BP 119/72 | HR 82

## 2021-10-01 DIAGNOSIS — Z3689 Encounter for other specified antenatal screening: Secondary | ICD-10-CM | POA: Insufficient documentation

## 2021-10-01 DIAGNOSIS — O26833 Pregnancy related renal disease, third trimester: Secondary | ICD-10-CM | POA: Diagnosis not present

## 2021-10-01 DIAGNOSIS — Z3A32 32 weeks gestation of pregnancy: Secondary | ICD-10-CM

## 2021-10-10 ENCOUNTER — Ambulatory Visit (INDEPENDENT_AMBULATORY_CARE_PROVIDER_SITE_OTHER): Payer: Medicaid Other | Admitting: Obstetrics & Gynecology

## 2021-10-10 ENCOUNTER — Other Ambulatory Visit: Payer: Self-pay

## 2021-10-10 VITALS — BP 109/67 | HR 82 | Wt 163.7 lb

## 2021-10-10 DIAGNOSIS — Z3493 Encounter for supervision of normal pregnancy, unspecified, third trimester: Secondary | ICD-10-CM

## 2021-10-10 DIAGNOSIS — D573 Sickle-cell trait: Secondary | ICD-10-CM

## 2021-10-10 DIAGNOSIS — R809 Proteinuria, unspecified: Secondary | ICD-10-CM

## 2021-10-10 NOTE — Progress Notes (Signed)
Patient complains of sharp pains in lower abdomen. Stated that the pain has start sometime last week.

## 2021-10-15 NOTE — BH Specialist Note (Addendum)
Integrated Behavioral Health via Telemedicine Visit  10/15/2021 Satcha Storlie [id number]  Number of Integrated Behavioral Health visits: 4 Session Start time: 8:46  Session End time: 9:08 Total time:  22  Referring Provider: [name], NP Patient/Family location: Home [facility] Provider location: Center for [facility]  All persons participating in visit: Patient Hannah Mccarthy and [facility] Jonathon Castelo   Types of Service: Individual psychotherapy and Video visit  I connected with [name] and/or Jon Gills Mccumbers's  n/a  via  Telephone or Video Enabled Telemedicine Application  (Video is Caregility application) and verified that I am speaking with the correct person using two identifiers. Discussed confidentiality: Yes   I discussed the limitations of telemedicine and the availability of in person appointments.  Discussed there is a possibility of technology failure and discussed alternative modes of communication if that failure occurs.  I discussed that engaging in this telemedicine visit, they consent to the provision of behavioral healthcare and the services will be billed under their insurance.  Patient and/or legal guardian expressed understanding and consented to Telemedicine visit: Yes   Presenting Concerns: Patient and/or family reports the following symptoms/concerns: Concern about not having pediatrician for baby yet, still needing a car seat, and inquiry about applying for DSS benefits, along with fatigue and lack of quality sleep, attributed to end of pregnancy; anticipating financial stress with upcoming maternity leave.  Duration of problem: Current pregnancy; Severity of problem: moderate  Patient and/or Family's Strengths/Protective Factors: Social connections, Concrete supports in place (healthy food, safe environments, etc.), and Sense of purpose  Goals Addressed: Patient will:  Reduce symptoms of: anxiety and stress    Increase knowledge and/or ability of: stress reduction   Demonstrate ability to: Increase healthy adjustment to current life circumstances  Progress towards Goals: Ongoing  Interventions: Interventions utilized:  Solution-Focused Strategies Standardized Assessments completed: Not Needed  Patient and/or Family Response: Pt agrees with treatmen plan  Assessment: Patient currently experiencing Generalized anxiety disorder and Psychosocial stress.   Patient may benefit from continued psychoeducation and brief therapeutic interventions regarding coping with symptoms of anxiety and current life stress .  Plan: Follow up with behavioral health clinician on : About two weeks postpartum Behavioral recommendations:  -Discuss pediatrician options at upcoming medical appointment -Discuss any available applications for car seat and financial assistance with Pregnancy Navigator at upcoming medical appointment, as discussed -Continue taking prenatal vitamin daily -Read through Postpartum Planner and additional resources on After Visit Summary today Referral(s): Integrated Art gallery manager (In Clinic) and [facility]:     I discussed the assessment and treatment plan with the patient and/or parent/guardian. They were provided an opportunity to ask questions and all were answered. They agreed with the plan and demonstrated an understanding of the instructions.   They were advised to call back or seek an in-person evaluation if the symptoms worsen or if the condition fails to improve as anticipated.  Rae Lips, LCSW  Depression screen [facility] 2/9 10/24/2021 10/11/2021 09/24/2021 09/05/2021 08/06/2021  Decreased Interest 1 1 0 1 2  Down, Depressed, Hopeless 0 0 0 2 1  PHQ - 2 Score 1 1 0 3 3  Altered sleeping 2 1 1 1 2   Tired, decreased energy 2 1 1 2 2   Change in appetite 0 0 0 1 0  Feeling bad or failure about yourself  0 0 0 0 0  Trouble concentrating 0 0 0 0 0  Moving  slowly or fidgety/restless 0  0 0 0 0  Suicidal thoughts 0 0 0 0 0  PHQ-9 Score 5 3 2 7 7   Difficult doing work/chores - - - - -  Some recent data might be hidden   GAD 7 : Generalized Anxiety Score 10/24/2021 10/11/2021 09/24/2021 09/05/2021  Nervous, Anxious, on Edge 0 0 0 0  Control/stop worrying 0 0 0 1  Worry too much - different things 0 1 0 1  Trouble relaxing 0 0 0 0  Restless 1 1 0 2  Easily annoyed or irritable 0 0 2 1  Afraid - awful might happen 0 0 0 0  Total GAD 7 Score 1 2 2  5

## 2021-10-24 ENCOUNTER — Other Ambulatory Visit: Payer: Self-pay

## 2021-10-24 ENCOUNTER — Ambulatory Visit (INDEPENDENT_AMBULATORY_CARE_PROVIDER_SITE_OTHER): Payer: Medicaid Other | Admitting: Obstetrics and Gynecology

## 2021-10-24 DIAGNOSIS — O99891 Other specified diseases and conditions complicating pregnancy: Secondary | ICD-10-CM

## 2021-10-24 DIAGNOSIS — M549 Dorsalgia, unspecified: Secondary | ICD-10-CM

## 2021-10-24 DIAGNOSIS — Z3A35 35 weeks gestation of pregnancy: Secondary | ICD-10-CM | POA: Insufficient documentation

## 2021-10-24 DIAGNOSIS — Z3493 Encounter for supervision of normal pregnancy, unspecified, third trimester: Secondary | ICD-10-CM

## 2021-10-24 HISTORY — DX: Other specified diseases and conditions complicating pregnancy: O99.891

## 2021-10-24 MED ORDER — CYCLOBENZAPRINE HCL 10 MG PO TABS: 10.0000 mg | ORAL_TABLET | Freq: Three times a day (TID) | ORAL | 0 refills | Status: DC | PRN

## 2021-10-24 NOTE — Progress Notes (Signed)
° °  PRENATAL VISIT NOTE  Subjective:  Hannah Mccarthy is a 20 y.o. G2P0 at [redacted]w[redacted]d being seen today for ongoing prenatal care.  She is currently monitored for the following issues for this low-risk pregnancy and has Proteinuria; Supervision of low-risk pregnancy; GAD (generalized anxiety disorder); Renal disorder; Endometriosis; Asthma; Sickle cell trait (HCC); [redacted] weeks gestation of pregnancy; and Back pain affecting pregnancy in third trimester on their problem list.  Patient doing well with no acute concerns today. She reports  left ankle swelling which has resolved and back pain .  Contractions: Irritability. Vag. Bleeding: None.  Movement: Present. Denies leaking of fluid.   The following portions of the patient's history were reviewed and updated as appropriate: allergies, current medications, past family history, past medical history, past social history, past surgical history and problem list. Problem list updated.  Objective:   Vitals:   10/24/21 1332  BP: 130/76  Pulse: 100  Weight: 166 lb 4.8 oz (75.4 kg)    Fetal Status: Fetal Heart Rate (bpm): 137 Fundal Height: 35 cm Movement: Present     General:  Alert, oriented and cooperative. Patient is in no acute distress.  Skin: Skin is warm and dry. No rash noted.   Cardiovascular: Normal heart rate noted  Respiratory: Normal respiratory effort, no problems with respiration noted  Abdomen: Soft, continue gravid, appropriate for gestational age.  Pain/Pressure: Present     Pelvic: Cervical exam deferred        Extremities: Normal range of motion.  Edema: Mild pitting, slight indentation  Mental Status:  Normal mood and affect. Normal behavior. Normal judgment and thought content.   Assessment and Plan:  Pregnancy: G2P0 at [redacted]w[redacted]d  1. Encounter for supervision of low-risk pregnancy in third trimester Continue routine care  2. [redacted] weeks gestation of pregnancy   3. Back pain affecting pregnancy in third trimester Back pain c/w late  stage pregnancy, offer flexeril for relief - cyclobenzaprine (FLEXERIL) 10 MG tablet; Take 1 tablet (10 mg total) by mouth 3 (three) times daily as needed for muscle spasms.  Dispense: 30 tablet; Refill: 0  Preterm labor symptoms and general obstetric precautions including but not limited to vaginal bleeding, contractions, leaking of fluid and fetal movement were reviewed in detail with the patient.  Please refer to After Visit Summary for other counseling recommendations.   Return in about 1 week (around 10/31/2021) for LOB, in person, 36 weeks swabs.   Mariel Aloe, MD Faculty Attending Center for [facility]

## 2021-10-29 ENCOUNTER — Ambulatory Visit: Payer: Medicaid Other | Attending: Maternal & Fetal Medicine

## 2021-10-29 ENCOUNTER — Other Ambulatory Visit: Payer: Self-pay

## 2021-10-29 ENCOUNTER — Ambulatory Visit: Payer: Medicaid Other | Admitting: *Deleted

## 2021-10-29 ENCOUNTER — Ambulatory Visit: Payer: Self-pay | Admitting: Clinical

## 2021-10-29 VITALS — BP 124/74 | HR 81

## 2021-10-29 DIAGNOSIS — Z3A36 36 weeks gestation of pregnancy: Secondary | ICD-10-CM

## 2021-10-29 DIAGNOSIS — Z658 Other specified problems related to psychosocial circumstances: Secondary | ICD-10-CM

## 2021-10-29 DIAGNOSIS — Z3689 Encounter for other specified antenatal screening: Secondary | ICD-10-CM | POA: Diagnosis present

## 2021-10-29 DIAGNOSIS — O26833 Pregnancy related renal disease, third trimester: Secondary | ICD-10-CM | POA: Diagnosis not present

## 2021-10-29 DIAGNOSIS — F411 Generalized anxiety disorder: Secondary | ICD-10-CM

## 2021-10-29 DIAGNOSIS — Z362 Encounter for other antenatal screening follow-up: Secondary | ICD-10-CM | POA: Diagnosis not present

## 2021-10-29 NOTE — Patient Instructions (Signed)
Center for [facility] at [facility] [place], [place] [address] [phone] (main office) [phone] ([place] office)  [place] Child Psychologist, counselling  (Childcare options, Early childcare development, etc.) [email or web address]     BRAINSTORMING  Develop a Plan Goals: Provide a way to start conversation about your new life with a baby Assist parents in recognizing and using resources within their reach Help pave the way before birth for an easier period of transition afterwards.  Make a list of the following information to keep in a central location: Full name of Mom and Partner: _____________________________________________ 60 full name and Date of Birth: ___________________________________________ Home Address: ___________________________________________________________ ________________________________________________________________________ Home Phone: ____________________________________________________________ Parents' cell numbers: _____________________________________________________ ________________________________________________________________________ Name and contact info for OB: ______________________________________________ Name and contact info for Pediatrician:________________________________________ Contact info for Lactation Consultants: ________________________________________  REST and SLEEP *You each need at least 4-5 hours of uninterrupted sleep every day. Write specific names and contact information.* How are you going to rest in the postpartum period? While partner's home? When partner returns to work? When you both return to work? Where will your baby sleep? Who is available to help during the day? Evening? Night? Who could move in for a period to help support you? What are some ideas to help you get enough  sleep? __________________________________________________________________________________________________________________________________________________________________________________________________________________________________________ NUTRITIOUS FOOD AND DRINK *Plan for meals before your baby is born so you can have healthy food to eat during the immediate postpartum period.* Who will look after breakfast? Lunch? Dinner? List names and contact information. Brainstorm quick, healthy ideas for each meal. What can you do before baby is born to prepare meals for the postpartum period? How can others help you with meals? Which grocery stores provide online shopping and delivery? Which restaurants offer take-out or delivery options? ______________________________________________________________________________________________________________________________________________________________________________________________________________________________________________________________________________________________________________________________________________________________________________________________________  CARE FOR MOM *It's important that mom is cared for and pampered in the postpartum period. Remember, the most important ways new mothers need care are: sleep, nutrition, gentle exercise, and time off.* Who can come take care of mom during this period? Make a list of people with their contact information. List some activities that make you feel cared for, rested, and energized? Who can make sure you have opportunities to do these things? Does mom have a space of her very own within your home that's just for her? Make a [facility] where she can be comfortable, rest, and renew herself  daily. ______________________________________________________________________________________________________________________________________________________________________________________________________________________________________________________________________________________________________________________________________________________________________________________________________    CARE FOR AND FEEDING BABY *Knowledgeable and encouraging people will offer the best support with regard to feeding your baby.* Educate yourself and choose the best feeding option for your baby. Make a list of people who will guide, support, and be a resource for you as your care for and feed your baby. (Friends that have breastfed or are currently breastfeeding, lactation consultants, breastfeeding support groups, etc.) Consider a postpartum doula. (These websites can give you information: dona.org & BuyingShow.es) Seek out local breastfeeding resources like the breastfeeding support group at [employer] or [employer]. ______________________________________________________________________________________________________________________________________________________________________________________________________________________________________________________________________________________________________________________________________________________________________________________________________  Verner Chol AND ERRANDS Who can help with a thorough cleaning before baby is born? Make a list of people who will help with housekeeping and chores, like laundry, light cleaning, dishes, bathrooms, etc. Who can run some errands for you? What can you do to make sure you are stocked with basic supplies before baby is born? Who is going to do the  shopping? ______________________________________________________________________________________________________________________________________________________________________________________________________________________________________________________________________________________________________________________________________________________________________________________________________     Family Adjustment *Nurture yourselvesit helps parents be more loving and allows for better bonding  with their child.* What sorts of things do you and partner enjoy doing together? Which activities help you to connect and strengthen your relationship? Make a list of those things. Make a list of people whom you trust to care for your baby so you can have some time together as a couple. What types of things help partner feel connected to Mom? Make a list. What needs will partner have in order to bond with baby? Other children? Who will care for them when you go into labor and while you are in the hospital? Think about what the needs of your older children might be. Who can help you meet those needs? In what ways are you helping them prepare for bringing baby home? List some specific strategies you have for family adjustment. _______________________________________________________________________________________________________________________________________________________________________________________________________________________________________________________________________________________________________________________________________________  SUPPORT *Someone who can empathize with experiences normalizes your problems and makes them more bearable.* Make a list of other friends, neighbors, and/or co-workers you know with infants (and small children, if applicable) with whom you can connect. Make a list of local or online support groups, mom groups, etc. in which you can be  involved. ______________________________________________________________________________________________________________________________________________________________________________________________________________________________________________________________________________________________________________________________________________________________________________________________________  Childcare Plans Investigate and plan for childcare if mom is returning to work. Talk about mom's concerns about her transition back to work. Talk about partner's concerns regarding this transition.  Mental Health *Your mental health is one of the highest priorities for a pregnant or postpartum mom.* 1 in 5 women experience anxiety and/or depression from the time of conception through the first year after birth. Postpartum Mood Disorders are the #1 complication of pregnancy and childbirth and the suffering experienced by these mothers is not necessary! These illnesses are temporary and respond well to treatment, which often includes self-care, social support, talk therapy, and medication when needed. Women experiencing anxiety and depression often say things like: I'm supposed to be happywhy do I feel so sad?, Why can't I snap out of it?, I'm having thoughts that scare me. There is no need to be embarrassed if you are feeling these symptoms: Overwhelmed, anxious, angry, sad, guilty, irritable, hopeless, exhausted but can't sleep You are NOT alone. You are NOT to blame. With help, you WILL be well. Where can I find help? Medical professionals such as your OB, midwife, gynecologist, family practitioner, primary care provider, pediatrician, or mental health providers; [facility] support groups: Feelings After Birth, Breastfeeding Support Group, Baby and Me Group, and Fit 4 Two exercise classes. You have permission to ask for help. It will confirm your feelings, validate your experiences,  share/learn coping strategies, and gain support and encouragement as you heal. You are important! BRAINSTORM Make a list of local resources, including resources for mom and for partner. Identify support groups. Identify people to call late at night - include names and contact info. Talk with partner about perinatal mood and anxiety disorders. Talk with your OB, midwife, and doula about baby blues and about perinatal mood and anxiety disorders. Talk with your pediatrician about perinatal mood and anxiety disorders.   Support & Sanity Savers   What do you really need?  Basics In preparing for a new baby, many expectant parents spend hours shopping for baby clothes, decorating the nursery, and deciding which car seat to buy. Yet most don't think much about what the reality of parenting a newborn will be like, and what they need to make it through that. So, here is the advice of experienced parents. We know you'll read this, and think they're exaggerating, I don't really  need that. Just trust [place] on these, OK? Plan for all of this, and if it turns out you don't need it, come back and teach [place] how you did it!  Must-Haves (Once baby's survival needs are met, make sure you attend to your own survival needs!) Sleep An average newborn sleeps 16-18 hours per day, over 6-7 sleep periods, rarely more than three hours at a time. It is normal and healthy for a newborn to wake throughout the night... but really hard on parents!! Naps. Prioritize sleep above any responsibilities like: cleaning house, visiting friends, running errands, etc.  Sleep whenever baby sleeps. If you can't nap, at least have restful times when baby eats. The more rest you get, the more patient you will be, the more emotionally stable, and better at solving problems.  Food You may not have realized it would be difficult to eat when you have a newborn. Yet, when we talk to countless new parents, they say things like it may be 2:00 pm  when I realize I haven't had breakfast yet. Or every time we sit down to dinner, baby needs to eat, and my food gets cold, so I don't bother to eat it. Finger food. Before your baby is born, stock up with one months' worth of food that: 1) you can eat with one hand while holding a baby, 2) doesn't need to be prepped, 3) is good hot or cold, 4) doesn't spoil when left out for a few hours, and 5) you like to eat. Think about: nuts, dried fruit, Clif bars, pretzels, jerky, gogurt, baby carrots, apples, bananas, crackers, cheez-n-crackers, string cheese, hot pockets or frozen burritos to microwave, garden burgers and breakfast pastries to put in the toaster, yogurt drinks, etc. Restaurant Menus. Make lists of your favorite restaurants & menu items. When family/friends want to help, you can give specific information without much thought. They can either bring you the food or send gift cards for just the right meals. Freezer Meals.  Take some time to make a few meals to put in the freezer ahead of time.  Easy to freeze meals can be anything such as soup, lasagna, chicken pie, or spaghetti sauce. Set up a Meal Schedule.  Ask friends and family to sign up to bring you meals during the first few weeks of being home. (It can be passed around at baby showers!) You have no idea how helpful this will be until you are in the throes of parenting.  https://hamilton-woodard.com/ is a great website to check out. Emotional Support Know who to call when you're stressed out. Parenting a newborn is very challenging work. There are times when it totally overwhelms your normal coping abilities. EVERY NEW PARENT NEEDS TO HAVE A PLAN FOR WHO TO CALL WHEN THEY JUST CAN'T COPE ANY MORE. (And it has to be someone other than the baby's other parent!) Before your baby is born, come up with at least one person you can call for support - write their phone number down and post it on the refrigerator. Anxiety & Sadness. Baby blues are normal after  pregnancy; however, there are more severe types of anxiety & sadness which can occur and should not be ignored.  They are always treatable, but you have to take the first step by reaching out for help. [facility] offers a Mom Talk group which meets every Tuesday from 10 am - 11 am.  This group is for new moms who need support and connection after their babies  are born.  Call [phone].  Really, Really Helpful (Plan for them! Make sure these happen often!!) Physical Support with Taking Care of Yourselves Asking friends and family. Before your baby is born, set up a schedule of people who can come and visit and help out (or ask a friend to schedule for you). Any time someone says let me know what I can do to help, sign them up for a day. When they get there, their job is not to take care of the baby (that's your job and your joy). Their job is to take care of you!  Postpartum doulas. If you don't have anyone you can call on for support, look into postpartum doulas:  professionals at helping parents with caring for baby, caring for themselves, getting breastfeeding started, and helping with household tasks. www.padanc.org is a helpful website for learning about doulas in our area. Peer Support / Parent Groups Why: One of the greatest ideas for new parents is to be around other new parents. Parent groups give you a chance to share and listen to others who are going through the same season of life, get a sense of what is normal infant development by watching several babies learn and grow, share your stories of triumph and struggles with empathetic ears, and forgive your own mistakes when you realize all parents are learning by trial and error. Where to find: There are many places you can meet other new parents throughout our community.  [facility] offers the following classes for new moms and their little ones:  Baby and Me (Birth to [place]) and Breastfeeding Support Group. Go to  www.conehealthybaby.com or call [phone] for more information. Time for your Relationship It's easy to get so caught up in meeting baby's immediate needs that it's hard to find time to connect with your partner, and meet the needs of your relationship. It's also easy to forget what quality time with your partner actually looks like. If you take your baby on a date, you'd be amazed how much of your couple time is spent feeding the baby, diapering the baby, admiring the baby, and talking about the baby. Dating: Try to take time for just the two of you. Babysitter tip: Sometimes when moms are breastfeeding a newborn, they find it hard to figure out how to schedule outings around baby's unpredictable feeding schedules. Have the babysitter come for a three hour period. When she comes over, if baby has just eaten, you can leave right away, and come back in two hours. If baby hasn't fed recently, you start the date at home. Once baby gets hungry and gets a good feeding in, you can head out for the rest of your date time. Date Nights at Home: If you can't get out, at least set aside one evening a week to prioritize your relationship: whenever baby dozes off or doesn't have any immediate needs, spend a little time focusing on each other. Potential conflicts: The main relationship conflicts that come up for new parents are: issues related to sexuality, financial stresses, a feeling of an unfair division of household tasks, and conflicts in parenting styles. The more you can work on these issues before baby arrives, the better!  Fun and Frills (Don't forget these and don't feel guilty for indulging in them!) Everyone has something in life that is a fun little treat that they do just for themselves. It may be: reading the morning paper, or going for a daily jog, or having coffee with a  friend once a week, or going to a movie on Friday nights, or fine chocolates, or bubble baths, or curling up with a good  book. Unless you do fun things for yourself every now and then, it's hard to have the energy for fun with your baby. Whatever your special treats are, make sure you find a way to continue to indulge in them after your baby is born. These special moments can recharge you, and allow you to return to baby with a new joy   PERINATAL MOOD DISORDERS: [place]   _________________________________________Emergency and Crisis Resources If you are an imminent risk to self or others, are experiencing intense personal distress, and/or have noticed significant changes in activities of daily living, call:  [place]: [phone]  [address] [place], [place], [address] Mobile Crisis: [place]: 988 Or visit the following crisis centers: Local Emergency Departments [facility]: [address] [place]. Hours: 8:30AM-5PM. Insurance Accepted: Medicaid, Medicare, and Uninsured.  [facility]:  [address] [place]  Mon-Friday 8am-3pm, [phone]                                                                                  ___________ Non-Crisis Resources To identify specific providers that are covered by your insurance, contact your insurance company or local agencies:  [place]: [phone] [facility] and [employer]: [place]: [phone] Postpartum Support International- Warm-line: 410-791-6793                                                      __Outpatient Therapy and Medication Management   Providers:  [facility]: [phone] Hours: 9AM-5PM  Insurance Accepted: Alben Spittle, Shane Crutch, [place], [place] [facility]): [phone] Hours: 8AM-5:30PM  nsurance Accepted: All insurances EXCEPT AARP, [place],  [place], and [place]: [phone] Hours: 8AM-8PM Insurance Accepted: Cristal Ford, Freddrick March, [place], Medicare, [phone] [facility]: [phone] Hours: 8:30AM-7PM Insurance Accepted: Cristal Ford, Medicaid, Medicare, Tricare, [employer] Counseling:  [place] Accepted:  Holland Falling, Lorella Nimrod, [facility], [place]: [phone] Hours: 9AM-5:30PM Insurance Accepted: Alben Spittle, Charlotte Crumb, and Medicaid, Medicare, [facility]:  [phone] Hours: 9am-5pm Insurance Accepted: BCBS; they do not accept Medicaid/Medicare [place]: [phone] Hours: 9am-9pm Insurance Accepted: All major insurance including Medicaid and Medicare [facility]: [phone] Hours: [place] Accepted: All insurances EXCEPT Medicaid and Medicare. [facility]: [phone]   ____________  Parenting [place]: [phone] [place]:  [place]: (support for children in the NICU and/or with special needs), [phone]   ___________                                                                 Mental Health Support Groups Mental Health Association: [phone]    _____________                                                                                  Online Resources Postpartum Support International: http://jones-berg.com/  800-944-4PPD 2Moms Supporting Moms:  www.momssupportingmoms.net

## 2021-11-01 ENCOUNTER — Other Ambulatory Visit (HOSPITAL_COMMUNITY)
Admission: RE | Admit: 2021-11-01 | Discharge: 2021-11-01 | Disposition: A | Discharge status: Home / Self Care | Payer: Medicaid Other | Source: Ambulatory Visit | Attending: Nurse Practitioner | Admitting: Nurse Practitioner

## 2021-11-01 ENCOUNTER — Ambulatory Visit (INDEPENDENT_AMBULATORY_CARE_PROVIDER_SITE_OTHER): Payer: Medicaid Other | Admitting: Nurse Practitioner

## 2021-11-01 ENCOUNTER — Other Ambulatory Visit: Payer: Self-pay

## 2021-11-01 VITALS — BP 123/75 | HR 96 | Wt 173.4 lb

## 2021-11-01 DIAGNOSIS — Z3A36 36 weeks gestation of pregnancy: Secondary | ICD-10-CM | POA: Insufficient documentation

## 2021-11-01 DIAGNOSIS — D573 Sickle-cell trait: Secondary | ICD-10-CM | POA: Diagnosis not present

## 2021-11-01 DIAGNOSIS — Z3493 Encounter for supervision of normal pregnancy, unspecified, third trimester: Secondary | ICD-10-CM | POA: Insufficient documentation

## 2021-11-01 DIAGNOSIS — Z3490 Encounter for supervision of normal pregnancy, unspecified, unspecified trimester: Secondary | ICD-10-CM

## 2021-11-01 NOTE — Progress Notes (Signed)
° ° °  Subjective:  Hannah Mccarthy is a 20 y.o. G2P0 at [redacted]w[redacted]d being seen today for ongoing prenatal care.  She is currently monitored for the following issues for this low-risk pregnancy and has Proteinuria; Supervision of low-risk pregnancy; GAD (generalized anxiety disorder); Renal disorder; Endometriosis; Asthma; Sickle cell trait (HCC); Back pain affecting pregnancy in third trimester; Family history of diabetes mellitus (DM); Idiopathic scoliosis of thoracic spine; Severe episode of recurrent major depressive disorder, without psychotic features (HCC); Moderate persistent asthma with acute exacerbation; and High serum bone-specific alkaline phosphatase on their problem list.  Patient reports occasional contractions.  Contractions: Irritability. Vag. Bleeding: None.  Movement: Present. Denies leaking of fluid.   The following portions of the patient's history were reviewed and updated as appropriate: allergies, current medications, past family history, past medical history, past social history, past surgical history and problem list. Problem list updated.  Objective:   Vitals:   11/01/21 1138  BP: 123/75  Pulse: 96  Weight: 173 lb 6.4 oz (78.7 kg)    Fetal Status: Fetal Heart Rate (bpm): 140 Fundal Height: 32 cm Movement: Present  Presentation: Vertex  General:  Alert, oriented and cooperative. Patient is in no acute distress.  Skin: Skin is warm and dry. No rash noted.   Cardiovascular: Normal heart rate noted  Respiratory: Normal respiratory effort, no problems with respiration noted  Abdomen: Soft, gravid, appropriate for gestational age. Pain/Pressure: Absent     Pelvic:  Cervical exam performed     Station: -1 Vaginal swabs done.  Baby is down.  Cervix not palpated - possibly posterior - too uncomfortable  Extremities: Normal range of motion.  Edema: Trace  Mental Status: Normal mood and affect. Normal behavior. Normal judgment and thought content.   Urinalysis:      Assessment and  Plan:  Pregnancy: G2P0 at [redacted]w[redacted]d  1. Encounter for supervision of low-risk pregnancy, antepartum Cervical exam - baby is well into the pelvis and cervix not located - patient was too uncomfortable. Saw pregnancy navigator about car seats and TANF funding Saw [facility] provider earlier this week Peds list given in AVS  2. Sickle cell trait (HCC) OB urine culture done for third trimester  3. [redacted] weeks gestation of pregnancy  - Cervicovaginal ancillary only( [place]) - Culture, beta strep (group b only)  Preterm labor symptoms and general obstetric precautions including but not limited to vaginal bleeding, contractions, leaking of fluid and fetal movement were reviewed in detail with the patient. Please refer to After Visit Summary for other counseling recommendations.  Return in about 1 week (around 11/08/2021) for in person ROB.  [name], RN, MSN, NP-BC Nurse Practitioner, [facility] for [employer], [facility] 11/01/2021 12:10 PM

## 2021-11-01 NOTE — Patient Instructions (Signed)
AREA PEDIATRIC/FAMILY PRACTICE PHYSICIANS  Central/[place]outheast [place] ([address]) [facility] Chambliss, MD; Eniola, MD; Hale, MD; Hensel, MD; McDiarmid, MD; McIntyer, MD; Neal, MD; Walden, MD [address] [place], [place] [address] [phone] Mon-Fri 8:30-12:30, 1:30-5:00 Providers come to see babies at [facility] Accepting Medicaid [facility] providers who accept newborns: Koirala, MD; Morrow, MD; Wolters, MD [address], [place], [place] [address] [phone] Mon-Fri 8:00-5:30 Babies seen by providers at [facility] Does NOT accept Medicaid Please call early in hospitalization for appointment (limited availability)  [facility] Mulberry, MD [address] [place], [place] [address] [phone] Mon, Tue, Thur, Fri 8:30-5:00, Wed 10:00-7:00 (closed 1-2pm) Babies seen by [facility] providers Accepting Medicaid Rubin - Pediatrician Rubin, MD [address], [place], [place] [address] [phone] Mon-Fri 8:30-5:00, Sat 8:30-12:00 Provider comes to see babies at [facility] Accepting Medicaid Must have been referred from current patients or contacted office prior to delivery [facility] ([facility]) Brown, MD; Chandler, MD; Ettefagh, MD; Grant, MD; Lester, MD; McCormick, MD; McQueen, MD; Prose, MD; Simha, MD; Stanley, MD; Stryffeler, NP; Tebben, NP [address], [place], [place] [address] [phone] Mon, Tue, Thur, Fri 8:30-5:30, Wed 9:30-5:30, Sat 8:30-12:30 Babies seen by [facility] providers Accepting Medicaid Only accepting infants of first-time parents or siblings of current patients Hospital discharge coordinator will make follow-up appointment Jack Amos [address], [place], [place]  [address] [phone]   Fax - [phone] [facility] [address], [place], [place]  [address] Phone - [phone]   Fax - [phone] Shilpa Gosrani [address], [place], [place]  [address] [phone]  East/[place]ortheast [place] ([address]) [facility] Bates, MD; Brassfield, MD; Cooper, Cox, MD; MD; Davis, MD; Dovico, MD; Ettefaugh, MD; Little, MD; Lowe, MD; Keiffer, MD; Melvin, MD; Sumner, MD; Williams, MD [address], [place], [place] [address] [phone] Mon-Fri 8:30-5:00 (extended evenings Mon-Thur as needed), Sat-Sun 10:00-1:00 Providers come to see babies at [facility] Accepting Medicaid for families of first-time babies and families with all children in the household age 3 and under. Must register with office prior to making appointment (M-F only). [facility] Henson, NP; Knapp, MD; Lalonde, MD; Tysinger, PA [address] [place], [place] [address] [phone] Mon-Fri 8:00-5:00 Babies seen by providers at [facility] Does NOT accept Medicaid/Commercial Insurance Only [facility] ([facility])  Artis, MD; Barnes, MD; Bratton, MD; Coccaro, MD; [place] Gardner, MD; Kramer, MD; Marshall, MD; Netherton, MD; Poleto, MD; Skinner, MD [address] [place], [place] [address] [phone] Mon-Fri 8:30-5:30, Sat (Oct.-Mar.) 9:00-1:00 Babies seen by providers at [facility] Accepting Medicaid  [place] ([address]) [facility] Reid, MD; Warner, MD [address], [place], [place] [address] [phone] Mon-Fri 8:30-5:00, Sat 8:30-12:00 Providers come to see babies at [facility] Does NOT accept Medicaid [facility] Becker, PA; Hagler, MD; Scifres, PA; Sun, MD; Swayne, MD [address], [place], [place] [address] [phone] Mon-Fri 8:00-5:00 Babies seen by providers at [facility] Does NOT accept Medicaid Only accepting babies of parents who  are patients Please call early in hospitalization for appointment (limited availability)  Pediatricians Clark, MD; Frye, MD; Kelleher, MD; Mack, NP; Miller, MD; O'Keller, MD; Patterson, NP; Pudlo, MD; Puzio, MD; Thomas, MD; Tucker, MD; Twiselton, MD [address], [place], [place] [address] [phone] Mon-Fri 8:00-5:00, Sat 9:00-12:00 Providers come to see babies at [facility] Does NOT accept Medicaid  [place] ([address]) [facility] providers accepting new patients: Brake, NP; Wharton, PA [address], [place], [place] [address] [phone] Mon-Fri 8:00-5:00 Babies seen by providers at [facility] Does NOT accept Medicaid Only accepting babies of parents who are patients Please call early in hospitalization for appointment (limited availability) [facility] Gay, MD; Quinlan, MD [address] [place], [place] [address] [phone] (press 1 to schedule appointment) Mon-Fri 8:00-5:00 Providers come to see babies at [facility] Does NOT accept Medicaid [facility] Mazer, MD [address] [place], [place] [address] [phone] Mon-Fri 8:30-5:00 (lunch 12:30-1:00), extended hours by appointment only Wed 5:00-6:30 Babies seen by [facility] providers Accepting Medicaid [facility] Banks, MD; Jordan, MD; Koberlein, MD [address], [place], [place] [address] [phone] Mon-Fri 8:00-5:00 Babies seen by [facility] providers Does NOT accept Medicaid [facility] Parker, MD; Hunter, MD; Wallace, DO [address] [place], [place] [address] [phone] Mon-Fri 8:00-5:00 Babies seen by [facility] providers Does NOT accept Medicaid [facility] Brandon, PA; Brecken, PA; Christy, NP; Dees, MD; DeClaire, MD; DeWeese, MD; Hansen, NP; Mills, NP; Parrish, NP; Smoot, NP; Summer, MD; Vapne,  MD [address] [place], [place] [address] [phone] Mon-Fri 8:30-5:00, Sat 10:00-1:00 Providers come to see babies at [facility] Does NOT accept Medicaid Free prenatal information session Tuesdays at 4:45pm [facility] Bouska, MD; Gordon, PA; Jeffery, PA; Weber, PA [address] [place] [address] [phone] Mon-Fri 7:30-5:30 Babies seen by [facility] providers [facility] [address], [place], [place]  [address] [phone]   Fax - [phone]  [place] ([address] & [address]) [facility] [place] [address] [place], [place] [address] [phone] Mon-Thur 8:00-6:00 Providers come to see babies at [facility] Accepting Medicaid [facility] Anderson, NP; Badger, MD; Beal, PA; Spencer, PA [address] [place], [place] [address] [phone] Mon-Thur 7:30-7:30, Fri 7:30-4:30 Babies seen by [facility] providers Accepting Medicaid [facility] Agbuya, MD; Klett, NP; Romgoolam, MD [address], [place], [place] [address] [phone] Mon-Fri 8:30-5:00, Sat 8:30-12:00 Providers come to see babies at [facility] Accepting Medicaid Must have "Meet & Greet" appointment at office prior to delivery [facility] ([facility]) McCord, MD; Wallace, MD; Wood, MD [address], [place], [place] [address] [phone] Mon-Wed 8:00-6:00, Thur-Fri 8:00-5:00, Sat 9:00-12:00 Providers come to see babies at [facility] Does NOT accept Medicaid Only accepting siblings of current patients [facility]  [address], [place], [place]  [address] [phone]   Fax - [phone] [facility] [address], [place], [place]  [address] [phone]   Fax -  [phone]  [place] ([address] & [address]) [facility] Cirigliano, DO; Matthews, DO [address] [place], [place] [address] [phone] Mon-Fri 7:00-5:00 Babies seen by [facility] providers Does NOT accept Medicaid [facility] Briscoe, MD; Howley, PA; Moreira, PA [address], [place], [place] [address] [phone] Mon-Fri 8:00-5:00 Babies seen by [facility] providers Accepting Medicaid [facility] Boyd, MD; Church, PA; Jones, NP; Osborn, PA [address], , [place] [address] [phone] Mon-Fri 8:00-5:00 Babies seen by providers at [facility] Accepting Medicaid  [place] ([address]) [facility] [place] Wendling, DO [address] [place], [place] [address] [phone] Mon-Fri 8:00-5:00 Babies seen by [facility] providers Does NOT accept Medicaid Limited availability, please call early in hospitalization to schedule follow-up [facility] Calderon, PA; Cummings, MD; Dillard, MD; Martin, PA; Olson, MD; VanDeven, PA [address], [place], [place] [address] [phone] Mon-Fri 8:30-5:00, Sat 9:00-12:00 Babies seen by providers at [facility] Accepting Medicaid Please register online then schedule online or call office [email or web address] ([facility]) Hunter, NP; Kumar, MD; Martin Rogers, PA [address], [place], [place] [address] [phone] Mon-Fri 8:00-5:00 Babies seen by providers at [facility] Accepting Medicaid [facility] ([facility]) Clyde, MD; Kristi Fleenor, NP; West, MD [address], [place], [place] [address] [phone] Mon-Fri 8:00-5:30, Sat&Sun by appointment (phones open at  8:30) Babies seen by [facility] providers Accepting Medicaid Must be a first-time baby or sibling of current patient [facility] [place]  [address], [place], [place]  [address] [phone]   Fax - [phone]  [place] ([address]) [facility] Brown, PA; Cowen, PA; Rice, MD; Helton, PA; Spry, MD [address] [place], [place] [address] [phone] Mon-Thur 8:00-7:00, Fri 8:00-5:00, Sat 8:00-12:00, Sun 9:00-12:00 Babies seen by [facility] providers Accepting Medicaid [facility] Coe-Goins, MD; Marshall, MD; Pierre-Louis, MD [address], [place], [place] [address] [phone] Mon-Thur 8:00-5:00 Babies seen by providers at [facility] Accepting Medicaid [facility] Bratton, MD; Coe-Goins, MD; Hayes, MD; Lewis, MD; List, MD; Lott, MD; Marshall, MD; Moran, MD; O'Neal, MD; Pierre-Louis, MD; Pitonzo, MD; Scholer, MD; Spangle, MD [address] [place], [place] [address] [phone] Mon-Fri 8:00-5:30, Sat (Oct.-Mar.) 9:00-1:00 Babies seen by providers at [facility] Accepting Medicaid Must fill out new patient packet, available online at [email or web address] [facility] ([facility]) Friddle, NP; Harris, NP; Kelly, NP; Logan, MD; Melvin, PA; Poth, MD; Ramadoss, MD; Stanton, NP [address], [place], [place] [address] [phone] Mon-Thur 8:00-5:30, Fri 8:00-5:00 Babies seen by providers at [facility] Accepting Medicaid  [place] ([address]) [facility] Dixon, PA; [place], MD; Pickard, MD; Tapia, PA [address], [place], [place] [address] [phone] Mon-Fri 8:00-5:00 Babies seen by providers at [facility] Accepting Medicaid   [place] ([address]) [facility] [place] Masneri, DO; Meyers, MD; Nelson, PA [address], [place], [place] [address] [phone] Mon-Fri 8:00-5:00 Babies seen by providers at [facility] Does NOT accept Medicaid Limited appointment availability, please call early in hospitalization  [facility] [place] Kunedd, DO; McGowen, MD [address], [place], [place] [address] [phone] Mon-Fri 8:00-5:00 Babies seen by [facility] providers Does NOT accept Medicaid [facility] [place] Cameron, MD; MacDonald, MD; Michaels, PA; Nayak, MD [address] [place] [address], [place], [place] [address] [phone] Mon-Fri 8:00-5:00 After hours clinic ([address] [place], [place] [address]) [phone] Mon-Fri 5:00-8:00, Sat 12:00-6:00, Sun 10:00-4:00 Babies seen by [facility] providers Accepting Medicaid [facility] [place] [address], [place], [place]  [address] [phone]   Fax - [phone]  [place] ([address]) [facility] [place] [facility] Andy, MD [address], [place], [place] [address] [phone] Mon-Fri 8:00-5:00 Babies seen by [facility] providers Does NOT accept Medicaid [facility] [place] ([facility] [place]) Eksir, MD [address], [place], [place] [address] [phone] Mon-Thur 8:00-7:00, Fri 8:00-5:00, Sat 8:00-12:00 Babies seen by providers at [facility] Accepting Medicaid - but does not have vaccinations in office (must be received elsewhere) Limited availability, please call early in hospitalization  [place] ([address]) [facility]  Charlene Flemming, MD [address], [place] [address] [phone]  Fax [phone]  [place] [facility]  Kimberly Newton, MD, Annamarie Streilein, PA, Carla Hampton, PA [address] [place], [place]  [address] [phone] [facility]  [address] [place] [address], [place], [place] [address] [phone] [address], [place], [place] [address] [phone] (West Office)  [facility] [address], [place], [place] [address] [phone] [facility] [address], [place], [place] [address] [phone] [facility] [address], [place], [place] [address] [phone] [facility] [address], [place], [place] [address] [phone] [facility] [address], [place], [place] [address] [phone] [facility] [address], [place], [place] [address] [phone] [facility]  [address], [place], [place] [address] [phone] Dr[facility] [address] [place], [place], [place] [address] [phone] [facility] [address], [place], [place] [address] [phone] [facility] [address], [place],  [address] [phone]  

## 2021-11-03 LAB — CULTURE, OB URINE

## 2021-11-03 LAB — URINE CULTURE, OB REFLEX

## 2021-11-03 NOTE — L&D Delivery Note (Signed)
OB/GYN Faculty Practice Delivery Note  Candance Bohlman is a 21 y.o. G1P1001 s/p SVD at [redacted]w[redacted]d. She was admitted for IOL due to PROM.   ROM: 13h 72m with clear fluid GBS Status: Positive; received PCN  Delivery Date/Time: 11/24/21 at 1452  Delivery: Called to room and patient was complete and pushing. Head delivered LOA. No nuchal cord present. Shoulder and body delivered in usual fashion. Infant with spontaneous cry, placed on mother's abdomen, dried and stimulated. Cord clamped x 2 after 1-minute delay and cut by family member under my direct supervision. Cord blood drawn. Placenta delivered spontaneously with gentle cord traction. Fundus firm with massage and Pitocin. Labia, perineum, vagina, and cervix were inspected, and patient was found to have a 1st degree perineal laceration that was repaired with 3-0 Vicryl and found to be hemostatic.   Placenta: Intact, 3VC - sent to L&D Complications: None Lacerations: 1st degree perineal  EBL: 325 cc Analgesia: Epidural   Infant: Viable female   APGARs 9 and 9  Evalina Field, MD OB/GYN Fellow, Faculty Practice

## 2021-11-05 ENCOUNTER — Encounter: Payer: Self-pay | Admitting: Nurse Practitioner

## 2021-11-05 DIAGNOSIS — O99820 Streptococcus B carrier state complicating pregnancy: Secondary | ICD-10-CM | POA: Insufficient documentation

## 2021-11-05 LAB — CERVICOVAGINAL ANCILLARY ONLY
Chlamydia: NEGATIVE
Comment: NEGATIVE
Comment: NORMAL
Neisseria Gonorrhea: NEGATIVE

## 2021-11-05 LAB — CULTURE, BETA STREP (GROUP B ONLY): Strep Gp B Culture: POSITIVE — AB

## 2021-11-11 ENCOUNTER — Other Ambulatory Visit: Payer: Self-pay

## 2021-11-11 ENCOUNTER — Ambulatory Visit (INDEPENDENT_AMBULATORY_CARE_PROVIDER_SITE_OTHER): Payer: Medicaid Other | Admitting: Family Medicine

## 2021-11-11 ENCOUNTER — Encounter: Payer: Self-pay | Admitting: Family Medicine

## 2021-11-11 VITALS — BP 117/78 | HR 78 | Wt 174.8 lb

## 2021-11-11 DIAGNOSIS — Z3A38 38 weeks gestation of pregnancy: Secondary | ICD-10-CM

## 2021-11-11 DIAGNOSIS — Z3493 Encounter for supervision of normal pregnancy, unspecified, third trimester: Secondary | ICD-10-CM

## 2021-11-11 DIAGNOSIS — N289 Disorder of kidney and ureter, unspecified: Secondary | ICD-10-CM

## 2021-11-11 DIAGNOSIS — O99820 Streptococcus B carrier state complicating pregnancy: Secondary | ICD-10-CM

## 2021-11-11 DIAGNOSIS — F411 Generalized anxiety disorder: Secondary | ICD-10-CM

## 2021-11-11 DIAGNOSIS — J4520 Mild intermittent asthma, uncomplicated: Secondary | ICD-10-CM

## 2021-11-11 NOTE — Progress Notes (Signed)
Patient report lower back pains. She describes the pain as "sharp"

## 2021-11-11 NOTE — Progress Notes (Signed)
° ° °  Subjective:  Hannah Mccarthy is a 21 y.o. G2P0 at [redacted]w[redacted]d being seen today for ongoing prenatal care.  She is currently monitored for the following issues for this low-risk pregnancy and has Proteinuria; Supervision of low-risk pregnancy; GAD (generalized anxiety disorder); Renal disorder; Endometriosis; Asthma; Sickle cell trait ([place]); Family history of diabetes mellitus (DM); Idiopathic scoliosis of thoracic spine; Severe episode of recurrent major depressive disorder, without psychotic features ([place]); High serum bone-specific alkaline phosphatase; and Group B Streptococcus carrier, +RV culture, currently pregnant on their problem list.  Patient reports pelvic pain with leaning forward/sitting and standing for prolonged periods. Would like her cervix checked. Contractions: Irritability.  Movement: Present. Denies leaking of fluid.   The following portions of the patient's history were reviewed and updated as appropriate: allergies, current medications, past family history, past medical history, past social history, past surgical history and problem list.   Objective:   Vitals:   11/11/21 1201  BP: 117/78  Pulse: 78  Weight: 174 lb 12.8 oz (79.3 kg)    Fetal Status: Fetal Heart Rate (bpm): 137 Fundal Height: 35 cm Movement: Present  Presentation: Vertex  General:  Alert, oriented and cooperative. Patient is in no acute distress.  Skin: Skin is warm and dry. No rash noted.   Cardiovascular: Normal heart rate noted  Respiratory: Normal respiratory effort, no problems with respiration noted  Abdomen: Soft, gravid, appropriate for gestational age. Pain/Pressure: Absent     Pelvic:  Cervical exam performed in the presence of a chaperone Dilation: Closed      Extremities: Normal range of motion.     Mental Status: Normal mood and affect. Normal behavior. Normal judgment and thought content.    Assessment and Plan:  Pregnancy: G2P0 at [redacted]w[redacted]d  1. Encounter for supervision of low-risk  pregnancy in third trimester Doing overall well with normal fetal movement.   2. [redacted] weeks gestation of pregnancy  3. Group B Streptococcus carrier, +RV culture, currently pregnant Needs abx in labor.  4. Mild intermittent asthma without complication Stable currently.   5. Renal disorder Had significant proteinuria in 2019, seen by Nephrology who recommended expectant management. No proteinuria on dipstick during this pregnancy, Cr has been WNL. Will monitor.   6. GAD (generalized anxiety disorder) Following with BH. Stable.   7. Fundal height measuring smaller than dates  About 35cm today, fetal head feels well engaged into the pelvis on cervical exam which is likely etiology. Recent US on 12/27 with normal EFW 43%. Will monitor.    Term labor symptoms and general obstetric precautions including but not limited to vaginal bleeding, contractions, leaking of fluid and fetal movement were reviewed in detail with the patient. Please refer to After Visit Summary for other counseling recommendations.   Return in about 1 week (around 11/18/2021) for [place].   Patriciaann Clan, DO

## 2021-11-20 ENCOUNTER — Encounter (HOSPITAL_COMMUNITY): Payer: Self-pay

## 2021-11-20 ENCOUNTER — Ambulatory Visit (INDEPENDENT_AMBULATORY_CARE_PROVIDER_SITE_OTHER): Payer: Medicaid Other | Admitting: Obstetrics and Gynecology

## 2021-11-20 ENCOUNTER — Other Ambulatory Visit: Payer: Self-pay

## 2021-11-20 ENCOUNTER — Other Ambulatory Visit: Payer: Self-pay | Admitting: Advanced Practice Midwife

## 2021-11-20 ENCOUNTER — Encounter (HOSPITAL_COMMUNITY): Payer: Self-pay | Admitting: *Deleted

## 2021-11-20 ENCOUNTER — Telehealth (HOSPITAL_COMMUNITY): Payer: Self-pay | Admitting: *Deleted

## 2021-11-20 VITALS — BP 125/74 | HR 85 | Wt 175.8 lb

## 2021-11-20 DIAGNOSIS — Z3493 Encounter for supervision of normal pregnancy, unspecified, third trimester: Secondary | ICD-10-CM

## 2021-11-20 DIAGNOSIS — Z3A39 39 weeks gestation of pregnancy: Secondary | ICD-10-CM

## 2021-11-20 NOTE — Addendum Note (Signed)
Addended by: Aletha Halim on: 11/20/2021 10:17 AM   Modules accepted: Orders

## 2021-11-20 NOTE — Progress Notes (Signed)
° °  PRENATAL VISIT NOTE  Subjective:  Hannah Mccarthy is a 21 y.o. G2P0 at [redacted]w[redacted]d being seen today for ongoing prenatal care.  She is currently monitored for the following issues for this low-risk pregnancy and has Proteinuria; Supervision of low-risk pregnancy; GAD (generalized anxiety disorder); Renal disorder; Endometriosis; Asthma; Sickle cell trait ([place]); Family history of diabetes mellitus (DM); Idiopathic scoliosis of thoracic spine; Severe episode of recurrent major depressive disorder, without psychotic features ([place]); High serum bone-specific alkaline phosphatase; and Group B Streptococcus carrier, +RV culture, currently pregnant on their problem list.  Patient reports no complaints.  Contractions: Irritability. Vag. Bleeding: None.  Movement: Present. Denies leaking of fluid.   The following portions of the patient's history were reviewed and updated as appropriate: allergies, current medications, past family history, past medical history, past social history, past surgical history and problem list.   Objective:   Vitals:   11/20/21 0856  BP: 125/74  Pulse: 85  Weight: 175 lb 12.8 oz (79.7 kg)    Fetal Status: Fetal Heart Rate (bpm): 141 Fundal Height: 39 cm Movement: Present  Presentation: Vertex  General:  Alert, oriented and cooperative. Patient is in no acute distress.  Skin: Skin is warm and dry. No rash noted.   Cardiovascular: Normal heart rate noted  Respiratory: Normal respiratory effort, no problems with respiration noted  Abdomen: Soft, gravid, appropriate for gestational age.  Pain/Pressure: Present     Pelvic: Cervical exam performed in the presence of a chaperone Dilation: 1 Effacement (%): 50    Extremities: Normal range of motion.  Edema: Trace  Mental Status: Normal mood and affect. Normal behavior. Normal judgment and thought content.   Assessment and Plan:  Pregnancy: G2P0 at [redacted]w[redacted]d 1. [redacted] weeks gestation of pregnancy No issues. Pt declines membrane sweeping.  Will set up post dates IOL 12/27 43%, 2808gm, ac 53%, afi 14>f/u prn  Term labor symptoms and general obstetric precautions including but not limited to vaginal bleeding, contractions, leaking of fluid and fetal movement were reviewed in detail with the patient. Please refer to After Visit Summary for other counseling recommendations.   Return in 1 week (on 11/27/2021) for md or app, low risk ob, nst/bpp with diane.  Future Appointments  Date Time Provider [place]  11/28/2021  8:15 AM [facility]  11/28/2021  9:55 AM Donnamae Jude, MD [facility]  11/29/2021  7:15 AM [facility] SCHED ROOM [facility] None  12/10/2021  2:45 PM [facility]    Aletha Halim, MD

## 2021-11-20 NOTE — Telephone Encounter (Signed)
Preadmission screen  

## 2021-11-22 ENCOUNTER — Encounter (HOSPITAL_COMMUNITY): Payer: Self-pay | Admitting: Obstetrics & Gynecology

## 2021-11-22 ENCOUNTER — Inpatient Hospital Stay (HOSPITAL_COMMUNITY)
Admission: AD | Admit: 2021-11-22 | Discharge: 2021-11-22 | Disposition: A | Discharge status: Home / Self Care | Payer: Medicaid Other | Attending: Obstetrics & Gynecology | Admitting: Obstetrics & Gynecology

## 2021-11-22 ENCOUNTER — Other Ambulatory Visit: Payer: Self-pay

## 2021-11-22 DIAGNOSIS — R12 Heartburn: Secondary | ICD-10-CM

## 2021-11-22 DIAGNOSIS — K219 Gastro-esophageal reflux disease without esophagitis: Secondary | ICD-10-CM | POA: Insufficient documentation

## 2021-11-22 DIAGNOSIS — O26893 Other specified pregnancy related conditions, third trimester: Secondary | ICD-10-CM | POA: Insufficient documentation

## 2021-11-22 DIAGNOSIS — R0602 Shortness of breath: Secondary | ICD-10-CM | POA: Insufficient documentation

## 2021-11-22 DIAGNOSIS — O99613 Diseases of the digestive system complicating pregnancy, third trimester: Secondary | ICD-10-CM | POA: Insufficient documentation

## 2021-11-22 DIAGNOSIS — O212 Late vomiting of pregnancy: Secondary | ICD-10-CM | POA: Diagnosis present

## 2021-11-22 DIAGNOSIS — O99513 Diseases of the respiratory system complicating pregnancy, third trimester: Secondary | ICD-10-CM | POA: Insufficient documentation

## 2021-11-22 DIAGNOSIS — O368130 Decreased fetal movements, third trimester, not applicable or unspecified: Secondary | ICD-10-CM | POA: Diagnosis present

## 2021-11-22 DIAGNOSIS — J4521 Mild intermittent asthma with (acute) exacerbation: Secondary | ICD-10-CM | POA: Diagnosis not present

## 2021-11-22 DIAGNOSIS — Z3A39 39 weeks gestation of pregnancy: Secondary | ICD-10-CM | POA: Insufficient documentation

## 2021-11-22 HISTORY — DX: Anxiety disorder, unspecified: F41.9

## 2021-11-22 HISTORY — DX: Depression, unspecified: F32.A

## 2021-11-22 MED ORDER — LIDOCAINE VISCOUS HCL 2 % MT SOLN
15.0000 mL | Freq: Once | OROMUCOSAL | Status: AC
  Administered 2021-11-22: 15 mL via ORAL
  Filled 2021-11-22: qty 15

## 2021-11-22 MED ORDER — ALUM & MAG HYDROXIDE-SIMETH 200-200-20 MG/5ML PO SUSP
30.0000 mL | Freq: Once | ORAL | Status: AC
  Administered 2021-11-22: 30 mL via ORAL
  Filled 2021-11-22: qty 30

## 2021-11-22 MED ORDER — IPRATROPIUM-ALBUTEROL 0.5-2.5 (3) MG/3ML IN SOLN
3.0000 mL | Freq: Once | RESPIRATORY_TRACT | Status: AC
Start: 2021-11-22 — End: 2021-11-22
  Administered 2021-11-22: 3 mL via RESPIRATORY_TRACT
  Filled 2021-11-22: qty 3

## 2021-11-22 NOTE — MAU Note (Signed)
Presents with c/o ctxs approx seven minutes apart for approx 1 hour.  Denies LOF or VB.  Endorses +FM, but less than usual.  Reports has eaten this morning, spaghetti.  Also reports SOB, has Hx of asthma and used inhaler 1 hour ago.  States using inhaler hasn't helped. States doesn't have a "breathing machine".

## 2021-11-22 NOTE — MAU Note (Signed)
Spoke with Amy in RT regarding Neb treatment.

## 2021-11-22 NOTE — Discharge Instructions (Signed)

## 2021-11-22 NOTE — MAU Provider Note (Signed)
History     CSN: [id number]  Arrival date and time: 11/22/21 1136   Event Date/Time   First Provider Initiated Contact with Patient 11/22/21 1224      Chief Complaint  Patient presents with   Emesis   Shortness of Breath   Contractions   Decreased Fetal Movement   HPI Hannah Mccarthy is a 21 y.o. G1P0 at [redacted]w[redacted]d who presents with several complaints. She states she woke up this morning craving spaghetti so she ate some first thing. She states since then she has felt short of breath and tightness in her chest. She reports she has been struggling with acid reflux since 35 weeks but did not try any medication today. She states she tried her inhaler which helped some but she is still feeling the symptoms. She also reports intermittent contractions. She states the baby has been moving today but not as much as normal.   OB History     Gravida  1   Para      Term      Preterm      AB      Living         SAB      IAB      Ectopic      Multiple      Live Births              Past Medical History:  Diagnosis Date   Anxiety    Asthma    Back pain affecting pregnancy in third trimester 10/24/2021   Depression    Endometriosis    Moderate persistent asthma with acute exacerbation 02/03/2016   Renal disorder    Stage 1    Past Surgical History:  Procedure Laterality Date   ABDOMINAL SURGERY     dx for endometriosis   MOUTH SURGERY      Family History  Problem Relation Age of Onset   Hypertension Mother    Heart disease Mother    Asthma Father    Diabetes Father    Diabetes Brother     Social History   Tobacco Use   Smoking status: Never    Passive exposure: Yes   Smokeless tobacco: Never  Vaping Use   Vaping Use: Former   Quit date: 03/05/2021   Substances: THC  Substance Use Topics   Alcohol use: No   Drug use: Never    Allergies:  Allergies  Allergen Reactions   Escitalopram Itching and Nausea Only   Naproxen Itching, Other (See Comments)  and Anxiety    Heaving menstrual bleeding    Medications Prior to Admission  Medication Sig Dispense Refill Last Dose   albuterol (VENTOLIN HFA) 108 (90 Base) MCG/ACT inhaler Inhale 2 puffs into the lungs every 4 (four) hours as needed for wheezing or shortness of breath. 1 each 2 11/22/2021 at 1100   magnesium oxide (MAG-OX) 400 MG tablet Take 400 mg by mouth daily.   11/21/2021   Prenatal Vit-Fe Fumarate-FA (PRENATAL VITAMINS PO) Take 1 tablet by mouth daily.   11/21/2021   aspirin EC 81 MG tablet Take 81 mg by mouth daily. Swallow whole.      beclomethasone (QVAR) 40 MCG/ACT inhaler Inhale 1 puff into the lungs 2 (two) times daily. 1 Inhaler 0    Blood Pressure Monitoring (BLOOD PRESSURE KIT) DEVI 1 Device by Does not apply route as needed. (Patient not taking: Reported on 11/11/2021) 1 each 0    budesonide-formoterol (SYMBICORT) 160-4.5 MCG/ACT inhaler Inhale  2 puffs into the lungs 2 (two) times daily. (Patient not taking: Reported on 11/11/2021)      cyclobenzaprine (FLEXERIL) 10 MG tablet Take 1 tablet (10 mg total) by mouth 3 (three) times daily as needed for muscle spasms. (Patient not taking: Reported on 11/11/2021) 30 tablet 0    Misc. Devices (GOJJI WEIGHT SCALE) MISC 1 Device by Does not apply route as needed. (Patient not taking: Reported on 11/11/2021) 1 each 0     Review of Systems  Constitutional: Negative.  Negative for fatigue and fever.  HENT: Negative.    Respiratory:  Positive for shortness of breath.   Cardiovascular: Negative.  Negative for chest pain.  Gastrointestinal: Negative.  Negative for abdominal pain, constipation, diarrhea, nausea and vomiting.  Genitourinary: Negative.  Negative for dysuria, vaginal bleeding and vaginal discharge.  Neurological: Negative.  Negative for dizziness and headaches.  Physical Exam   Blood pressure 123/75, pulse 91, temperature 98 F (36.7 C), temperature source Oral, resp. rate 16, height $RemoveBe'5\' 5"'JVziosapt$  (1.651 m), weight 80 kg, last menstrual  period 02/17/2021, SpO2 99 %, unknown if currently breastfeeding.  Physical Exam Vitals and nursing note reviewed.  Constitutional:      General: She is not in acute distress.    Appearance: She is well-developed.  HENT:     Head: Normocephalic.  Eyes:     Pupils: Pupils are equal, round, and reactive to light.  Cardiovascular:     Rate and Rhythm: Normal rate and regular rhythm.     Heart sounds: Normal heart sounds.  Pulmonary:     Effort: Pulmonary effort is normal. No respiratory distress.     Breath sounds: Normal breath sounds.  Abdominal:     General: Bowel sounds are normal. There is no distension.     Palpations: Abdomen is soft.     Tenderness: There is no abdominal tenderness.  Skin:    General: Skin is warm and dry.  Neurological:     Mental Status: She is alert and oriented to person, place, and time.  Psychiatric:        Mood and Affect: Mood normal.        Behavior: Behavior normal.        Thought Content: Thought content normal.        Judgment: Judgment normal.   Fetal Tracing:  Baseline: 125 Variability: moderate Accels: 15x15 Decels: none  Toco: occasional uc's  Dilation: Closed Presentation: Vertex Exam by:: C Adean Milosevic CNM   MAU Course  Procedures  MDM GI cocktail Duoneb No signs of labor. Vital signs stable NST reactive and patient reports normal movement in MAU Patient reports resolution of symptoms.   Assessment and Plan   1. Mild intermittent asthma with exacerbation   2. [redacted] weeks gestation of pregnancy   3. Heartburn during pregnancy in third trimester    -Discharge home in stable condition -GERD precautions discussed -Patient advised to follow-up with OB as scheduled for prenatal care -Patient may return to MAU as needed or if her condition were to change or worsen   Wende Mott CNM 11/22/2021, 12:24 PM

## 2021-11-22 NOTE — MAU Note (Signed)
Resp in with pt

## 2021-11-24 ENCOUNTER — Inpatient Hospital Stay (HOSPITAL_COMMUNITY)
Admission: AD | Admit: 2021-11-24 | Discharge: 2021-11-26 | DRG: 806 | Disposition: A | Discharge status: Home / Self Care | Payer: Medicaid Other | Attending: Family Medicine | Admitting: Family Medicine

## 2021-11-24 ENCOUNTER — Inpatient Hospital Stay (HOSPITAL_COMMUNITY): Payer: Medicaid Other | Admitting: Anesthesiology

## 2021-11-24 ENCOUNTER — Other Ambulatory Visit: Payer: Self-pay

## 2021-11-24 ENCOUNTER — Inpatient Hospital Stay (HOSPITAL_COMMUNITY): Admit: 2021-11-24 | Payer: Self-pay

## 2021-11-24 ENCOUNTER — Encounter (HOSPITAL_COMMUNITY): Payer: Self-pay | Admitting: Obstetrics & Gynecology

## 2021-11-24 DIAGNOSIS — O4292 Full-term premature rupture of membranes, unspecified as to length of time between rupture and onset of labor: Secondary | ICD-10-CM | POA: Diagnosis not present

## 2021-11-24 DIAGNOSIS — J45909 Unspecified asthma, uncomplicated: Secondary | ICD-10-CM | POA: Diagnosis not present

## 2021-11-24 DIAGNOSIS — O99824 Streptococcus B carrier state complicating childbirth: Secondary | ICD-10-CM | POA: Diagnosis not present

## 2021-11-24 DIAGNOSIS — O26893 Other specified pregnancy related conditions, third trimester: Secondary | ICD-10-CM | POA: Diagnosis not present

## 2021-11-24 DIAGNOSIS — N289 Disorder of kidney and ureter, unspecified: Secondary | ICD-10-CM

## 2021-11-24 DIAGNOSIS — Z3A40 40 weeks gestation of pregnancy: Secondary | ICD-10-CM

## 2021-11-24 DIAGNOSIS — O9952 Diseases of the respiratory system complicating childbirth: Secondary | ICD-10-CM | POA: Diagnosis present

## 2021-11-24 DIAGNOSIS — M4124 Other idiopathic scoliosis, thoracic region: Secondary | ICD-10-CM | POA: Diagnosis not present

## 2021-11-24 DIAGNOSIS — O99892 Other specified diseases and conditions complicating childbirth: Secondary | ICD-10-CM | POA: Diagnosis present

## 2021-11-24 DIAGNOSIS — O4202 Full-term premature rupture of membranes, onset of labor within 24 hours of rupture: Secondary | ICD-10-CM | POA: Diagnosis not present

## 2021-11-24 DIAGNOSIS — R809 Proteinuria, unspecified: Secondary | ICD-10-CM | POA: Diagnosis present

## 2021-11-24 DIAGNOSIS — D573 Sickle-cell trait: Secondary | ICD-10-CM | POA: Diagnosis present

## 2021-11-24 DIAGNOSIS — N189 Chronic kidney disease, unspecified: Secondary | ICD-10-CM | POA: Diagnosis not present

## 2021-11-24 DIAGNOSIS — O99820 Streptococcus B carrier state complicating pregnancy: Secondary | ICD-10-CM | POA: Diagnosis not present

## 2021-11-24 DIAGNOSIS — F332 Major depressive disorder, recurrent severe without psychotic features: Secondary | ICD-10-CM | POA: Diagnosis present

## 2021-11-24 DIAGNOSIS — O26833 Pregnancy related renal disease, third trimester: Secondary | ICD-10-CM | POA: Diagnosis present

## 2021-11-24 DIAGNOSIS — O9902 Anemia complicating childbirth: Secondary | ICD-10-CM | POA: Diagnosis not present

## 2021-11-24 DIAGNOSIS — Z20822 Contact with and (suspected) exposure to covid-19: Secondary | ICD-10-CM | POA: Diagnosis present

## 2021-11-24 DIAGNOSIS — Z3490 Encounter for supervision of normal pregnancy, unspecified, unspecified trimester: Secondary | ICD-10-CM

## 2021-11-24 DIAGNOSIS — O480 Post-term pregnancy: Secondary | ICD-10-CM | POA: Diagnosis present

## 2021-11-24 DIAGNOSIS — F411 Generalized anxiety disorder: Secondary | ICD-10-CM | POA: Diagnosis present

## 2021-11-24 DIAGNOSIS — O368130 Decreased fetal movements, third trimester, not applicable or unspecified: Secondary | ICD-10-CM | POA: Diagnosis not present

## 2021-11-24 LAB — POCT FERN TEST: POCT Fern Test: NEGATIVE

## 2021-11-24 LAB — RESP PANEL BY RT-PCR (FLU A&B, COVID) ARPGX2
Influenza A by PCR: NEGATIVE
Influenza B by PCR: NEGATIVE
SARS Coronavirus 2 by RT PCR: NEGATIVE

## 2021-11-24 LAB — CBC
HCT: 40.7 % (ref 36.0–46.0)
Hemoglobin: 13.7 g/dL (ref 12.0–15.0)
MCH: 27 pg (ref 26.0–34.0)
MCHC: 33.7 g/dL (ref 30.0–36.0)
MCV: 80.1 fL (ref 80.0–100.0)
Platelets: 320 10*3/uL (ref 150–400)
RBC: 5.08 MIL/uL (ref 3.87–5.11)
RDW: 12.7 % (ref 11.5–15.5)
WBC: 8.8 10*3/uL (ref 4.0–10.5)
nRBC: 0 % (ref 0.0–0.2)

## 2021-11-24 LAB — TYPE AND SCREEN
ABO/RH(D): B POS
Antibody Screen: NEGATIVE

## 2021-11-24 LAB — BASIC METABOLIC PANEL
Anion gap: 14 (ref 5–15)
BUN: 6 mg/dL (ref 6–20)
CO2: 20 mmol/L — ABNORMAL LOW (ref 22–32)
Calcium: 8.9 mg/dL (ref 8.9–10.3)
Chloride: 103 mmol/L (ref 98–111)
Creatinine, Ser: 0.54 mg/dL (ref 0.44–1.00)
GFR, Estimated: >60 mL/min (ref >60)
Glucose, Bld: 107 mg/dL — ABNORMAL HIGH (ref 70–99)
Potassium: 3.9 mmol/L (ref 3.5–5.1)
Sodium: 137 mmol/L (ref 135–145)

## 2021-11-24 LAB — RPR: RPR Ser Ql: NONREACTIVE

## 2021-11-24 MED ORDER — COCONUT OIL OIL
1.0000 "application " | TOPICAL_OIL | Status: DC | PRN
  Administered 2021-11-24: 1 via TOPICAL

## 2021-11-24 MED ORDER — PHENYLEPHRINE 40 MCG/ML (10ML) SYRINGE FOR IV PUSH (FOR BLOOD PRESSURE SUPPORT)
80.0000 ug | PREFILLED_SYRINGE | INTRAVENOUS | Status: DC | PRN
  Filled 2021-11-24: qty 10

## 2021-11-24 MED ORDER — OXYTOCIN-SODIUM CHLORIDE 30-0.9 UT/500ML-% IV SOLN
2.5000 [IU]/h | INTRAVENOUS | Status: DC
  Filled 2021-11-24: qty 500

## 2021-11-24 MED ORDER — ONDANSETRON HCL 4 MG/2ML IJ SOLN
4.0000 mg | Freq: Four times a day (QID) | INTRAMUSCULAR | Status: DC | PRN
  Administered 2021-11-24: 4 mg via INTRAVENOUS
  Filled 2021-11-24: qty 2

## 2021-11-24 MED ORDER — PENICILLIN G POT IN DEXTROSE 60000 UNIT/ML IV SOLN
3.0000 10*6.[IU] | INTRAVENOUS | Status: DC
  Administered 2021-11-24 (×2): 3 10*6.[IU] via INTRAVENOUS
  Filled 2021-11-24 (×2): qty 50

## 2021-11-24 MED ORDER — FENTANYL-BUPIVACAINE-NACL 0.5-0.125-0.9 MG/250ML-% EP SOLN
12.0000 mL/h | EPIDURAL | Status: DC | PRN
  Administered 2021-11-24: 12 mL/h via EPIDURAL
  Filled 2021-11-24 (×2): qty 250

## 2021-11-24 MED ORDER — MISOPROSTOL 50MCG HALF TABLET
50.0000 ug | ORAL_TABLET | ORAL | Status: DC
  Administered 2021-11-24: 50 ug via BUCCAL

## 2021-11-24 MED ORDER — ACETAMINOPHEN 325 MG PO TABS: 650.0000 mg | ORAL_TABLET | ORAL | Status: DC | PRN

## 2021-11-24 MED ORDER — SOD CITRATE-CITRIC ACID 500-334 MG/5ML PO SOLN: 30.0000 mL | ORAL | Status: DC | PRN

## 2021-11-24 MED ORDER — OXYTOCIN BOLUS FROM INFUSION
333.0000 mL | Freq: Once | INTRAVENOUS | Status: AC
  Administered 2021-11-24: 333 mL via INTRAVENOUS

## 2021-11-24 MED ORDER — LACTATED RINGERS IV SOLN
INTRAVENOUS | Status: DC
  Administered 2021-11-24: 1000 mL via INTRAVENOUS

## 2021-11-24 MED ORDER — LIDOCAINE HCL (PF) 1 % IJ SOLN: 30.0000 mL | INTRAMUSCULAR | Status: DC | PRN

## 2021-11-24 MED ORDER — EPHEDRINE 5 MG/ML INJ: 10.0000 mg | INTRAVENOUS | Status: DC | PRN

## 2021-11-24 MED ORDER — PRENATAL MULTIVITAMIN CH
1.0000 | ORAL_TABLET | Freq: Every day | ORAL | Status: DC
  Administered 2021-11-25: 1 via ORAL
  Filled 2021-11-24: qty 1

## 2021-11-24 MED ORDER — FENTANYL CITRATE (PF) 100 MCG/2ML IJ SOLN
50.0000 ug | INTRAMUSCULAR | Status: AC | PRN
  Administered 2021-11-24: 100 ug via INTRAVENOUS
  Filled 2021-11-24: qty 2

## 2021-11-24 MED ORDER — SODIUM CHLORIDE 0.9 % IV SOLN
5.0000 10*6.[IU] | Freq: Once | INTRAVENOUS | Status: AC
  Administered 2021-11-24: 5 10*6.[IU] via INTRAVENOUS
  Filled 2021-11-24: qty 5

## 2021-11-24 MED ORDER — ONDANSETRON HCL 4 MG/2ML IJ SOLN: 4.0000 mg | INTRAMUSCULAR | Status: DC | PRN

## 2021-11-24 MED ORDER — FENTANYL CITRATE (PF) 100 MCG/2ML IJ SOLN: 50.0000 ug | INTRAMUSCULAR | Status: DC | PRN

## 2021-11-24 MED ORDER — TERBUTALINE SULFATE 1 MG/ML IJ SOLN: 0.2500 mg | Freq: Once | INTRAMUSCULAR | Status: DC | PRN

## 2021-11-24 MED ORDER — WITCH HAZEL-GLYCERIN EX PADS: 1.0000 "application " | MEDICATED_PAD | CUTANEOUS | Status: DC | PRN

## 2021-11-24 MED ORDER — MEDROXYPROGESTERONE ACETATE 150 MG/ML IM SUSP: 150.0000 mg | INTRAMUSCULAR | Status: DC | PRN

## 2021-11-24 MED ORDER — PHENYLEPHRINE 40 MCG/ML (10ML) SYRINGE FOR IV PUSH (FOR BLOOD PRESSURE SUPPORT): 80.0000 ug | PREFILLED_SYRINGE | INTRAVENOUS | Status: DC | PRN

## 2021-11-24 MED ORDER — TERBUTALINE SULFATE 1 MG/ML IJ SOLN
0.2500 mg | Freq: Once | INTRAMUSCULAR | Status: AC | PRN
  Administered 2021-11-24: 0.25 mg via SUBCUTANEOUS
  Filled 2021-11-24: qty 1

## 2021-11-24 MED ORDER — ACETAMINOPHEN 325 MG PO TABS
650.0000 mg | ORAL_TABLET | ORAL | Status: DC | PRN
  Administered 2021-11-24 – 2021-11-26 (×7): 650 mg via ORAL
  Filled 2021-11-24 (×7): qty 2

## 2021-11-24 MED ORDER — OXYTOCIN-SODIUM CHLORIDE 30-0.9 UT/500ML-% IV SOLN
1.0000 m[IU]/min | INTRAVENOUS | Status: DC
  Administered 2021-11-24: 2 m[IU]/min via INTRAVENOUS

## 2021-11-24 MED ORDER — LACTATED RINGERS IV SOLN: 500.0000 mL | INTRAVENOUS | Status: DC | PRN

## 2021-11-24 MED ORDER — DIPHENHYDRAMINE HCL 50 MG/ML IJ SOLN: 12.5000 mg | INTRAMUSCULAR | Status: DC | PRN

## 2021-11-24 MED ORDER — ONDANSETRON HCL 4 MG PO TABS: 4.0000 mg | ORAL_TABLET | ORAL | Status: DC | PRN

## 2021-11-24 MED ORDER — SIMETHICONE 80 MG PO CHEW: 80.0000 mg | CHEWABLE_TABLET | ORAL | Status: DC | PRN

## 2021-11-24 MED ORDER — BENZOCAINE-MENTHOL 20-0.5 % EX AERO
1.0000 "application " | INHALATION_SPRAY | CUTANEOUS | Status: DC | PRN
  Administered 2021-11-24: 1 via TOPICAL
  Filled 2021-11-24: qty 56

## 2021-11-24 MED ORDER — DIPHENHYDRAMINE HCL 25 MG PO CAPS
25.0000 mg | ORAL_CAPSULE | Freq: Four times a day (QID) | ORAL | Status: DC | PRN
  Administered 2021-11-24: 25 mg via ORAL
  Filled 2021-11-24: qty 1

## 2021-11-24 MED ORDER — DIBUCAINE (PERIANAL) 1 % EX OINT: 1.0000 "application " | TOPICAL_OINTMENT | CUTANEOUS | Status: DC | PRN

## 2021-11-24 MED ORDER — IBUPROFEN 600 MG PO TABS
600.0000 mg | ORAL_TABLET | Freq: Four times a day (QID) | ORAL | Status: DC
  Administered 2021-11-24 – 2021-11-25 (×3): 600 mg via ORAL
  Filled 2021-11-24 (×3): qty 1

## 2021-11-24 MED ORDER — FENTANYL CITRATE (PF) 100 MCG/2ML IJ SOLN
50.0000 ug | INTRAMUSCULAR | Status: DC | PRN
  Administered 2021-11-24 (×2): 50 ug via INTRAVENOUS
  Filled 2021-11-24 (×2): qty 2

## 2021-11-24 MED ORDER — LIDOCAINE HCL (PF) 1 % IJ SOLN
INTRAMUSCULAR | Status: DC | PRN
  Administered 2021-11-24: 11 mL via EPIDURAL

## 2021-11-24 MED ORDER — LACTATED RINGERS IV SOLN: 500.0000 mL | Freq: Once | INTRAVENOUS | Status: DC

## 2021-11-24 MED ORDER — SENNOSIDES-DOCUSATE SODIUM 8.6-50 MG PO TABS
2.0000 | ORAL_TABLET | Freq: Every day | ORAL | Status: DC
  Administered 2021-11-26: 10:00:00 2 via ORAL
  Filled 2021-11-24 (×2): qty 2

## 2021-11-24 MED ORDER — MISOPROSTOL 25 MCG QUARTER TABLET: 25.0000 ug | ORAL_TABLET | ORAL | Status: DC | PRN

## 2021-11-24 MED ORDER — MISOPROSTOL 50MCG HALF TABLET
ORAL_TABLET | ORAL | Status: AC
  Filled 2021-11-24: qty 1

## 2021-11-24 NOTE — MAU Provider Note (Signed)
Event Date/Time   First Provider Initiated Contact with Patient 11/24/21 0132       S: Ms. Hannah Mccarthy is a 21 y.o. G1P0 at [redacted]w[redacted]d  who presents to MAU today complaining contractions since 2330. She denies vaginal bleeding. She denies continuous LOF, but reports an incident of leaking. She reports normal fetal movement.  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   O: BP 113/66 (BP Location: Right Arm)    Pulse 73    Temp 97.7 F (36.5 C) (Oral)    Resp 17    Ht 5\' 5"  (1.651 m)    Wt 79.6 kg    LMP 02/17/2021    SpO2 99%    BMI 29.19 kg/m  GENERAL: Well-developed, well-nourished female in no acute distress.  HEAD: Normocephalic, atraumatic.  CHEST: Normal effort of breathing, regular heart rate ABDOMEN: Soft, nontender, gravid PELVIC: Normal external female genitalia. Labia without tenderness or lesions bilaterally. Vagina is pink and rugated. No lesions noted. Cervix with normal contour, no lesions. Normal discharge.  Negative pooling. 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 Informed of negative visual HSV exam. Fern negative. Nurse report patient with large gush of clear fluid after collection of fern. Instructed to admit for labor for early labor and SROM.  May 2022, CNM 11/24/2021 1:32 AM

## 2021-11-24 NOTE — Progress Notes (Signed)
Soriya Ashlin is a 21 y.o. G1P0 at [redacted]w[redacted]d admitted for induction of labor due to PROM.  Subjective: Reports uncomfortable with contractions  Objective: BP 112/60    Pulse 81    Temp 98.6 F (37 C) (Oral)    Resp 16    Ht 5\' 5"  (1.651 m)    Wt 79.6 kg    LMP 02/17/2021    SpO2 94%    BMI 29.19 kg/m  No intake/output data recorded. No intake/output data recorded.  FHT:  FHR: 140 bpm, variability: moderate,  accelerations:  Present,  decelerations:  Present after frequent contractions UC:   frequent contractions SVE:   Dilation: 2.5 Effacement (%): 80 Station: -2 Exam by:: Cassie Freer, RN  Labs: Lab Results  Component Value Date   WBC 8.8 11/24/2021   HGB 13.7 11/24/2021   HCT 40.7 11/24/2021   MCV 80.1 11/24/2021   PLT 320 11/24/2021    Assessment / Plan: G1P0 at [redacted]w[redacted]d admitted for induction of labor due to PROM.  Labor:  s/p cytotec x1. Having periods of frequent back to back contractions with decels after contractions including two prolonged decels. Moderate variability in between. Cervix continues to be 2.5cm. Given terbutaline for tachysystole and hyperstimulation. Following terbutaline contractions spaced out and tracing Cat I with moderate variability  Will allow time for recovery and then assess for foley balloon placement  Fetal Wellbeing:  Category II prior to terbutaline, now Cat I Pain Control: Planning Epidural and IV pain meds I/D:   GBS pos>PCN  Renard Matter 11/24/2021, 8:37 AM

## 2021-11-24 NOTE — MAU Note (Signed)
Report given to charge nurse-will provide primary care RN with report. Pt ready for transport to room 203.

## 2021-11-24 NOTE — H&P (Signed)
OBSTETRIC ADMISSION HISTORY AND PHYSICAL  Hannah Mccarthy is a 21 y.o. female G1P0 with IUP at [redacted]w[redacted]d by LMP presenting for SROM and contractions. She reports some decreased fetal movement. Denies VB, no blurry vision, headaches or peripheral edema, and RUQ pain.  She plans on breast feeding. She request depo for birth control. She received her prenatal care at  [place]: By LMP --->  Estimated Date of Delivery: 11/24/21  Sono:   '@[redacted]w[redacted]d'$  CWD, normal anatomy, cephalic presentation, anterior placental lie, 2808g, 43% EFW   Prenatal History/Complications:  History of proteinuria/chronic renal disease GBS positive Past Medical History: Past Medical History:  Diagnosis Date   Anxiety    Asthma    Back pain affecting pregnancy in third trimester 10/24/2021   Depression    Endometriosis    Moderate persistent asthma with acute exacerbation 02/03/2016   Renal disorder    Stage 1-    Past Surgical History: Past Surgical History:  Procedure Laterality Date   ABDOMINAL SURGERY     dx for endometriosis   MOUTH SURGERY      Obstetrical History: OB History     Gravida  1   Para      Term      Preterm      AB      Living         SAB      IAB      Ectopic      Multiple      Live Births              Social History Social History   Socioeconomic History   Marital status: Single    Spouse name: Not on file   Number of children: Not on file   Years of education: Not on file   Highest education level: Not on file  Occupational History   Not on file  Tobacco Use   Smoking status: Never    Passive exposure: Yes   Smokeless tobacco: Never  Vaping Use   Vaping Use: Former   Quit date: 03/05/2021   Substances: THC  Substance and Sexual Activity   Alcohol use: No   Drug use: Never   Sexual activity: Yes    Birth control/protection: None  Other Topics Concern   Not on file  Social History Narrative   Not on file   Social Determinants of Health    Financial Resource Strain: Not on file  Food Insecurity: No Food Insecurity   Worried About Running Out of Food in the Last Year: Never true   [place] in the Last Year: Never true  Transportation Needs: No Transportation Needs   Lack of Transportation (Medical): No   Lack of Transportation (Non-Medical): No  Physical Activity: Not on file  Stress: Not on file  Social Connections: Not on file    Family History: Family History  Problem Relation Age of Onset   Hypertension Mother    Heart disease Mother    Asthma Father    Diabetes Father    Diabetes Brother     Allergies: Allergies  Allergen Reactions   Escitalopram Itching and Nausea Only   Naproxen Itching, Other (See Comments) and Anxiety    Heaving menstrual bleeding    Medications Prior to Admission  Medication Sig Dispense Refill Last Dose   albuterol (VENTOLIN HFA) 108 (90 Base) MCG/ACT inhaler Inhale 2 puffs into the lungs every 4 (four) hours as needed for wheezing or  shortness of breath. 1 each 2 11/24/2021   aspirin EC 81 MG tablet Take 81 mg by mouth daily. Swallow whole.   11/24/2021   magnesium oxide (MAG-OX) 400 MG tablet Take 400 mg by mouth daily.   11/24/2021   Prenatal Vit-Fe Fumarate-FA (PRENATAL VITAMINS PO) Take 1 tablet by mouth daily.   11/24/2021   beclomethasone (QVAR) 40 MCG/ACT inhaler Inhale 1 puff into the lungs 2 (two) times daily. (Patient not taking: Reported on 11/24/2021) 1 Inhaler 0 Not Taking   Blood Pressure Monitoring (BLOOD PRESSURE KIT) DEVI 1 Device by Does not apply route as needed. (Patient not taking: Reported on 11/11/2021) 1 each 0    budesonide-formoterol (SYMBICORT) 160-4.5 MCG/ACT inhaler Inhale 2 puffs into the lungs 2 (two) times daily. (Patient not taking: Reported on 11/11/2021)      cyclobenzaprine (FLEXERIL) 10 MG tablet Take 1 tablet (10 mg total) by mouth 3 (three) times daily as needed for muscle spasms. (Patient not taking: Reported on 11/11/2021) 30 tablet 0    Misc.  Devices (GOJJI WEIGHT SCALE) MISC 1 Device by Does not apply route as needed. (Patient not taking: Reported on 11/11/2021) 1 each 0      Review of Systems   All systems reviewed and negative except as stated in HPI  Blood pressure 113/66, pulse 73, temperature 97.7 F (36.5 C), temperature source Oral, resp. rate 17, height $RemoveBe'5\' 5"'xOjVcExkH$  (1.651 m), weight 79.6 kg, last menstrual period 02/17/2021, SpO2 99 %, unknown if currently breastfeeding. General appearance: alert Lungs: clear to auscultation bilaterally Heart: regular rate and rhythm Abdomen: soft, non-tender; bowel sounds normal Extremities: Homans sign is negative, no sign of DVT Presentation: cephalic (per MAU exam) Fetal monitoringBaseline: 140 bpm, Variability: Good {> 6 bpm), Accelerations: Reactive, and Decelerations: Absent Uterine activityevery 6-7 min Dilation: 2 Effacement (%): 80 Station: -2 Exam by:: Gavin Pound CNM   Prenatal labs: ABO, Rh: B/Positive/-- (07/11 1504) Antibody: Negative (07/11 1504) Rubella: 1.94 (07/11 1504) RPR: Non Reactive (11/03 0922)  HBsAg: Negative (07/11 1504)  HIV: Non Reactive (11/03 0922)  GBS: Positive/-- (12/30 1214)  2 hr Glucola normal Genetic screening  LR NIPS, carrier for sickle cell anemia Anatomy US normal  Prenatal Transfer Tool  Maternal Diabetes: No Genetic Screening: Abnormal:  Results: Other: carrier for sickle cell trait Maternal Ultrasounds/Referrals: Normal Fetal Ultrasounds or other Referrals:  None Maternal Substance Abuse:  No Significant Maternal Medications:  None Significant Maternal Lab Results: Group B Strep positive  No results found for this or any previous visit (from the past 24 hour(s)).  Patient Active Problem List   Diagnosis Date Noted   Group B Streptococcus carrier, +RV culture, currently pregnant 11/05/2021   Sickle cell trait ([place]) 07/09/2021   Supervision of low-risk pregnancy 04/25/2021   Renal disorder    Endometriosis    Asthma     High serum bone-specific alkaline phosphatase 01/16/2020   Family history of diabetes mellitus (DM) 06/07/2019   Severe episode of recurrent major depressive disorder, without psychotic features ([place]) 06/07/2019   Proteinuria 08/04/2018   Idiopathic scoliosis of thoracic spine 07/13/2018   GAD (generalized anxiety disorder) 06/02/2016    Assessment/Plan:  Arine Foley is a 21 y.o. G1P0 at [redacted]w[redacted]d here for SROM (at 0120) and contractions  #Labor:Cervix 2 cm and then SROM. Contracting every 6-7 min 2 hrs after ROM. Offer expectant management or augmentation. Patient preferes starting augmentation now as wanting to get process started. Reports she would like to start with Cytotec and then  consider foley balloon at next check #Pain: IV pain meds and epidural  #FWB: Cat II at times with minimal variability and intermittent decels. Moderate variability in between and accels present. Overall reassuring. #ID:  GBS pos>PCN #MOF: breast #MOC: Depo #Circ:  N/A  #Chronic kidney disease Patient with hx of proteinuria prior to pregnancy. Diagnosed 2-3 yrs ago with elevated 24 hr urine protein. Cr. Normal. Saw nephrology at time of dx and recommended expectant management and renal US at that time without significant renal pathology. Took baby ASA in pregnancy. Normal growth [place]. - per MFM urine culture in 2nd and 3rd trimesters. Per chart normal flora in 3rd Trimester urine culture. No results seen for 2nd trimester urine culture (although order was placed) - will check Creatinine with admission labs.  Renard Matter, MD, MPH OB Fellow, Faculty Practice

## 2021-11-24 NOTE — MAU Note (Signed)
Pt requesting IV pain medication and epidural.

## 2021-11-24 NOTE — MAU Note (Signed)
Pt reports feeling a "pop"-visual inspection of perineum noted moderate amount of clear amniotic fluid. Peri care provided.

## 2021-11-24 NOTE — Lactation Note (Signed)
This note was copied from a baby's chart. Lactation Consultation Note  Patient Name: Hannah Mccarthy [id number]'[id number] Date: 11/24/2021 Reason for consult: Initial assessment;1st time breastfeeding;Term;Infant < 6lbs Age:23 hours Mom's feeding choice is breast and formula feeding infant. Mom has not latch infant yet, previous two feedings were formula ( mom's choice). Mom open to using DEBP, mom was still pumping when LC left room and had expressed 10 mls of colostrum. Mom understand EBM is safe at room temperature for 4 hours whereas formula must be used within 1 hour. [facility] written name on white board in room, mom is to call for assistance with latching infant at the breast for the next feeding. Mom shown how to use DEBP & how to disassemble, clean, & reassemble parts.  Mom made aware of O/P services, breastfeeding support groups, community resources, and our phone # for post-discharge questions.   Mom's plan: 1- Mom will breastfeed infant according to hunger cues, 8 to 12+ or more times within 24 hours, skin to skin. 2- Mom will call RN/LC to assist with latching infant at the breast for the next feeding. 3- After latching infant at the breast mom will offer her EBM first before supplementing infant with formula.  4-Mom will continue to use DEBP, pump every 3 hours for 15 minutes on initial setting.   Maternal Data Has patient been taught Hand Expression?: Yes Does the patient have breastfeeding experience prior to this delivery?: No  Feeding Mother's Current Feeding Choice: Breast Milk and Formula  LATCH Score                    Lactation Tools Discussed/Used Tools: Pump Breast pump type: Double-Electric Breast Pump Pump Education: Setup, frequency, and cleaning;Milk Storage Reason for Pumping: IInfant is less than 6 lbs, mom not latch infant at breast for prior to feedings. Pumped volume: 10 mL (Mom was still pumping when LC left the room.)  Interventions Interventions: Breast  feeding basics reviewed;Skin to skin;Expressed milk;DEBP;Education;LC Services brochure  Discharge    Consult Status Consult Status: Follow-up Date: 11/25/21 Follow-up type: In-patient    Danelle Earthly 11/24/2021, 8:25 PM

## 2021-11-24 NOTE — MAU Note (Addendum)
Pt reported she was told by her primary care provider that she was HSV positive. Pt states she has never had an outbreak or symptoms. She is not on HSV treatment. Visual inspection per CNM with speculum exam of perineum, vagina and cervix - noted all clear. HSV 1 &2 positive per Care everywhere lab results.

## 2021-11-24 NOTE — Progress Notes (Signed)
**Note Hannah-Identified via Obfuscation** Labor Progress Note Hannah Mccarthy is a 21 y.o. G1P0 at [redacted]w[redacted]d who presented for IOL due to PROM.   S: Resting, no concerns.   O:  BP 118/80    Pulse 90    Temp 98 F (36.7 C) (Oral)    Resp 16    Ht 5\' 5"  (1.651 m)    Wt 79.6 kg    LMP 02/17/2021    SpO2 95%    BMI 29.19 kg/m   EFM: Baseline 165 bpm, moderate variability, + accels, late/variable decels   CVE: Dilation: 10 Dilation Complete Date: 11/24/21 Dilation Complete Time: 1226 Effacement (%): 100 Cervical Position: Posterior Station: Plus 1, Plus 2 Presentation: Vertex Exam by:: Tenita Cue MD   A&P: 21 y.o. G1P0 [redacted]w[redacted]d   #Labor: Progressing well. Now complete and plus 1 to plus 2 station. Not feeling pressure or urge to push. Will labor down for one hour and reassess. Plan to start pushing sooner if indicated by [facility].  #Pain: Epidural  #FWB: Cat 2 due to late/variable decelerations. Improved after discontinuation of Pitocin and IVF bolus. Reassuring variability. Will continue to monitor closely.  #GBS positive > PCN  [facility], MD 12:34 PM

## 2021-11-24 NOTE — MAU Note (Signed)
Fern collected-SVE attempt- unable to reach cervix-very posterior.

## 2021-11-24 NOTE — Lactation Note (Signed)
This note was copied from a baby's chart. Lactation Consultation Note  Patient Name: Hannah Mccarthy [id number] Date: 11/24/2021 Reason for consult: L&D Initial assessment;Mother's request;Difficult latch;Nipple pain/trauma;Breastfeeding assistance Age:23 hours  LC assisted with latch trying to get depth on breast. Mom states feeling pain at this time, prefer to latch on the floor.   Mom feeding plan breast and bottle with formula. We reviewed feeding cues 8-12x 24hr period. Mom to get further [facility] support on the floor.   Maternal Data    Feeding Mother's Current Feeding Choice: Breast Milk and Formula  LATCH Score                    Lactation Tools Discussed/Used    Interventions Interventions: Breast feeding basics reviewed;Assisted with latch;Skin to skin;Adjust position;Support pillows;Education  Discharge    Consult Status Consult Status: Follow-up from L&D Date: 11/25/21 Follow-up type: In-patient    Drayden Lukas  N[place] 11/24/2021, 3:55 PM

## 2021-11-24 NOTE — Progress Notes (Signed)
Labor Progress Note Hannah Mccarthy is a 21 y.o. G1P0 at [redacted]w[redacted]d who presented for IOL due to PROM.  S: Doing well. Comfortable after epidural placement. No concerns at this time.  O:  BP 112/66    Pulse 73    Temp 98.6 F (37 C) (Oral)    Resp 17    Ht 5\' 5"  (1.651 m)    Wt 79.6 kg    LMP 02/17/2021    SpO2 96%    BMI 29.19 kg/m   EFM: Baseline 145 bpm, moderate variability, + accels, no decels  Toco: Every 3-6 minutes   CVE: Dilation: 4 Effacement (%): 80 Station: -2 Presentation: Vertex  Exam by:: [email or web address], MD  A&P: 21 y.o. G1P0 [redacted]w[redacted]d   #Labor: Progressing well. Will start Pitocin 2x2 to help augment contraction pattern. Will reassess in 4 hours.  #Pain: Epidural  #FWB: Cat 1  #GBS positive; PCN ordered   [redacted]w[redacted]d, MD 9:32 AM

## 2021-11-24 NOTE — Anesthesia Preprocedure Evaluation (Signed)
Anesthesia Evaluation  Patient identified by MRN, date of birth, ID band Patient awake    Reviewed: Allergy & Precautions, NPO status , Patient's Chart, lab work & pertinent test results  Airway Mallampati: II  TM Distance: >3 FB Neck ROM: Full    Dental no notable dental hx.    Pulmonary asthma ,    Pulmonary exam normal breath sounds clear to auscultation       Cardiovascular negative cardio ROS Normal cardiovascular exam Rhythm:Regular Rate:Normal     Neuro/Psych Anxiety Depression negative neurological ROS  negative psych ROS   GI/Hepatic negative GI ROS, Neg liver ROS,   Endo/Other  negative endocrine ROS  Renal/GU negative Renal ROS  negative genitourinary   Musculoskeletal negative musculoskeletal ROS (+)   Abdominal   Peds negative pediatric ROS (+)  Hematology negative hematology ROS (+)   Anesthesia Other Findings   Reproductive/Obstetrics (+) Pregnancy                             Anesthesia Physical Anesthesia Plan  ASA: 2  Anesthesia Plan: Epidural   Post-op Pain Management:    Induction:   PONV Risk Score and Plan:   Airway Management Planned:   Additional Equipment:   Intra-op Plan:   Post-operative Plan:   Informed Consent:   Plan Discussed with:   Anesthesia Plan Comments:         Anesthesia Quick Evaluation

## 2021-11-24 NOTE — Discharge Summary (Signed)
Postpartum Discharge Summary    Patient Name: Hannah Mccarthy DOB: 2001/07/14 MRN: [id number]  Date of admission: 11/24/2021 Delivery date:11/24/2021  Delivering provider: Genia Del  Date of discharge: 11/26/2021  Admitting diagnosis: Post-dates pregnancy [O48.0] Intrauterine pregnancy: [redacted]w[redacted]d    Secondary diagnosis:  Principal Problem:   Vaginal delivery Active Problems:   Proteinuria   Supervision of low-risk pregnancy   GAD (generalized anxiety disorder)   Renal disorder   Asthma   Sickle cell trait (HCC)   Idiopathic scoliosis of thoracic spine   Severe episode of recurrent major depressive disorder, without psychotic features ([facility]elez   Group B Streptococcus carrier, +RV culture, currently pregnant   Post-dates pregnancy  Additional problems:     Discharge diagnosis: Term Pregnancy Delivered                                              Post partum procedures:N/A Augmentation: Pitocin and Cytotec Complications: None  Hospital course: Induction of Labor With Vaginal Delivery   21y.o. yo G1P0 at 473w0das admitted to the hospital 11/24/2021 for induction of labor.  Indication for induction:  PROM .  Patient started her induction with Cytotec, followed by Pitocin until she progressed to complete. She had an uncomplicated vaginal delivery.  Membrane Rupture Time/Date: 1:20 AM ,11/24/2021   Delivery Method:Vaginal, Spontaneous  Episiotomy: None  Lacerations:  1st degree;Perineal  Details of delivery can be found in separate delivery note.  Patient had a routine postpartum course. Patient is discharged home 11/26/21.  Newborn Data: Birth date:11/24/2021  Birth time:2:52 PM  Gender:Female  Living status:Living  Apgars:9 ,9  Weight:2610 g   Magnesium Sulfate received: No BMZ received: No Rhophylac: N/A MMR: N/A T-DaP: Given prenatally Flu: No Transfusion: No   Physical exam  Vitals:   11/24/21 1436 11/24/21 1441 11/24/21 1446 11/24/21 1451  BP:      Pulse:       Resp:      Temp:      TempSrc:      SpO2: 97% 96% 98% 97%  Weight:      Height:       General: alert, cooperative, and no distress Lochia: appropriate Uterine Fundus: firm Incision: N/A DVT Evaluation: No evidence of DVT seen on physical exam.  Labs: Lab Results  Component Value Date   WBC 8.8 11/24/2021   HGB 13.7 11/24/2021   HCT 40.7 11/24/2021   MCV 80.1 11/24/2021   PLT 320 11/24/2021   CMP Latest Ref Rng & Units 11/24/2021  Glucose 70 - 99 mg/dL 107(H)  BUN 6 - 20 mg/dL 6  Creatinine 0.44 - 1.00 mg/dL 0.54  Sodium 135 - 145 mmol/L 137  Potassium 3.5 - 5.1 mmol/L 3.9  Chloride 98 - 111 mmol/L 103  CO2 22 - 32 mmol/L 20(L)  Calcium 8.9 - 10.3 mg/dL 8.9  Total Protein 6.0 - 8.5 g/dL -  Total Bilirubin 0.0 - 1.2 mg/dL -  Alkaline Phos 44 - 121 IU/L -  AST 0 - 40 IU/L -  ALT 0 - 32 IU/L -   Edinburgh Score: No flowsheet data found.   After visit meds:  Allergies as of 11/26/2021       Reactions   Escitalopram Itching, Nausea Only   Naproxen Itching, Other (See Comments), Anxiety   Heaving menstrual bleeding  Medication List     STOP taking these medications    aspirin EC 81 MG tablet   cyclobenzaprine 10 MG tablet Commonly known as: FLEXERIL       TAKE these medications    acetaminophen 325 MG tablet Commonly known as: Tylenol Take 2 tablets (650 mg total) by mouth every 4 (four) hours as needed (for pain scale < 4).   albuterol 108 (90 Base) MCG/ACT inhaler Commonly known as: VENTOLIN HFA Inhale 2 puffs into the lungs every 4 (four) hours as needed for wheezing or shortness of breath.   beclomethasone 40 MCG/ACT inhaler Commonly known as: QVAR Inhale 1 puff into the lungs 2 (two) times daily.   Blood Pressure Kit Devi 1 Device by Does not apply route as needed.   budesonide-formoterol 160-4.5 MCG/ACT inhaler Commonly known as: SYMBICORT Inhale 2 puffs into the lungs 2 (two) times daily.   Gojji Weight Scale Misc 1 Device  by Does not apply route as needed.   magnesium oxide 400 MG tablet Commonly known as: MAG-OX Take 400 mg by mouth daily.   oxyCODONE 5 MG immediate release tablet Commonly known as: Oxy IR/ROXICODONE Take 1 tablet (5 mg total) by mouth every 4 (four) hours as needed for up to 2 days for moderate pain.   PRENATAL VITAMINS PO Take 1 tablet by mouth daily.       Discharge home in stable condition Infant Feeding: Breast Infant Disposition:home with mother Discharge instruction: per After Visit Summary and Postpartum booklet. Activity: Advance as tolerated. Pelvic rest for 6 weeks.  Diet: routine diet Future Appointments: Future Appointments  Date Time Provider [place]  11/28/2021  8:15 AM [facility]  11/28/2021  9:55 AM Donnamae Jude, MD [facility]  12/10/2021  2:45 PM [place]   Follow up Visit: Message sent to [facility] by Dr. Gwenlyn Perking on 11/24/21.   Please schedule this patient for a In person postpartum visit in 6 weeks with the following provider: Any provider. Additional Postpartum F/U: Postpartum Depression checkup in one week Low risk pregnancy complicated by:  Chronic proteinuria Delivery mode:  Vaginal, Spontaneous  Anticipated Birth Control:  Depo  Mallie Snooks, [place], MSN, CNM Certified Nurse Midwife, Product/process development scientist for [employer], [place]

## 2021-11-24 NOTE — Anesthesia Procedure Notes (Signed)
Epidural Patient location during procedure: OB Start time: 11/24/2021 7:58 AM End time: 11/24/2021 8:21 AM  Staffing Anesthesiologist: Lowella Curb, MD Performed: anesthesiologist   Preanesthetic Checklist Completed: patient identified, IV checked, site marked, risks and benefits discussed, surgical consent, monitors and equipment checked, pre-op evaluation and timeout performed  Epidural Patient position: sitting Prep: ChloraPrep Patient monitoring: heart rate, cardiac monitor, continuous pulse ox and blood pressure Approach: midline Location: L2-L3 Injection technique: LOR saline  Needle:  Needle type: Tuohy  Needle gauge: 17 G Needle length: 9 cm Needle insertion depth: 5 cm Catheter type: closed end flexible Catheter size: 20 Guage Catheter at skin depth: 9 cm Test dose: negative  Assessment Events: blood not aspirated, injection not painful, no injection resistance, no paresthesia and negative IV test  Additional Notes Reason for block:procedure for pain

## 2021-11-24 NOTE — MAU Note (Signed)
..  Hannah Mccarthy is a 21 y.o. at [redacted]w[redacted]d here in MAU reporting: CTX since 2330 yesterday.Pt states not able to time due to pain, but frequent. Pt reports having mucous and bloody show all day.  Pt states she has not felt baby moving less for the last two hours. Pt denies concerns in pregnancy, LOF, abnormal discharge, epigastric pain, and vision changes. SVE on 11/22/2021 1.5cm. No intercourse in last 24 hours.   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

## 2021-11-25 MED ORDER — OXYCODONE HCL 5 MG PO TABS
5.0000 mg | ORAL_TABLET | ORAL | Status: DC | PRN
  Administered 2021-11-25 – 2021-11-26 (×6): 5 mg via ORAL
  Filled 2021-11-25 (×6): qty 1

## 2021-11-25 NOTE — Lactation Note (Addendum)
This note was copied from a baby's chart. Lactation Consultation Note  Patient Name: Hannah Mccarthy Date: 11/25/2021 Reason for consult: Follow-up assessment;Mother's request;Difficult latch;1st time breastfeeding;Term;Infant < 6lbs;Breastfeeding assistance Age:23 hours   LC assisted latching infant in football with signs of milk transfer.  Mom feeding plan breast and bottle supplementing with EBM first followed by formula. Infant adequate urine and stool output.   Plan 1. To feed based on cues 8-12x 24hr period. Mom to offer breasts with compression and look for signs of milk transfer.  2. Mom to supplement with eBM first followed by formula. BF supplementation guide provided. Mom aware to offer more volume if infant not latching. Mom provided  with white extra slow flow nipple to pace bottle feed EBM/formula. Mom states infant leakage yellow nipple used.  3. DEBP q 3hrs for 15 min   All questions answered at the end of the visit.  Maternal Data    Feeding Mother's Current Feeding Choice: Breast Milk and Formula  LATCH Score Latch: Repeated attempts needed to sustain latch, nipple held in mouth throughout feeding, stimulation needed to elicit sucking reflex.  Audible Swallowing: Spontaneous and intermittent  Type of Nipple: Everted at rest and after stimulation  Comfort (Breast/Nipple): Soft / non-tender  Hold (Positioning): Assistance needed to correctly position infant at breast and maintain latch.  LATCH Score: 8   Lactation Tools Discussed/Used Tools: Pump;Flanges;Coconut oil Flange Size: 27 Breast pump type: Double-Electric Breast Pump Pump Education: Setup, frequency, and cleaning;Milk Storage Reason for Pumping: increase stimulation Pumping frequency: every 3 hrs for 15 min  Interventions Interventions: Breast feeding basics reviewed;Assisted with latch;Skin to skin;Breast massage;Hand express;Breast compression;Adjust position;Support pillows;Expressed  milk;DEBP;Education;Pace feeding;Visual merchandiser education  Discharge    Consult Status Consult Status: Follow-up Date: 11/26/21 Follow-up type: In-patient    Tallen Schnorr  Nicholson-Springer 11/25/2021, 2:24 PM

## 2021-11-25 NOTE — Progress Notes (Signed)
Post Partum Day 1 Subjective: no complaints, up ad lib, voiding, tolerating PO, and + flatus  Objective: Blood pressure 113/62, pulse 86, temperature 98.8 F (37.1 C), resp. rate 16, height 5\' 5"  (1.651 m), weight 79.6 kg, last menstrual period 02/17/2021, SpO2 100 %, unknown if currently breastfeeding.  Physical Exam:  General: alert, cooperative, and no distress Lochia: appropriate Uterine Fundus: firm Incision: n/a DVT Evaluation: No evidence of DVT seen on physical exam.  Recent Labs    11/24/21 0139  HGB 13.7  HCT 40.7    Assessment/Plan: Plan for discharge tomorrow and Contraception planning depo    LOS: 1 day   11/26/21 CNM 11/25/2021, 5:33 AM

## 2021-11-25 NOTE — Social Work (Signed)
CSW received consult for hx of Anxiety, Depression and Edinburgh of 10.  CSW met with MOB to offer support and complete assessment.    ° °CSW introduced self and role. CSW observed baby 'Princess' sleeping in bassinet and MGM Shaimina present. MOB declined to have her mother leave the room for the assessment. CSW informed MOB of the reason for consult ans assessed mood. MOB smiled as she stated she is doing well. MOB shared her labor experience was rough. CSW discussed MOB's mental health history. MOB reported she was diagnosed with depression and anxiety 4 years ago. MOB stated she saw a counselor (Dr. Crystal) a few times prior the pregnancy, which she found to be helpful. MOB has access to her counselor and is open to resuming therapy. MOB reported she was also on medication, however the medication made her sleepy and she discontinued the use. MOB expressed she is not interested in medication. MOB stated music and working assist with managing symptoms. MOB identified her mother and sibling as her primary supports. MOB denies any SI, HI or being involved in DV. MOB was pleasant and easily engaged during the assessment. MOB did not display any acute mental health symptoms.  ° °CSW discussed the baby blues period versus perinatal mood disorders. CSW provided the New Mom Checklist and additional mental health resources. CSW encouraged self-evaluation postpartum.  °CSW provided review of Sudden Infant Death Syndrome (SIDS) precautions. MOB stated she has a bassinet and all infant essentials. MOB has contacted WIC to schedule and appointment. CSW discussed MOB also utilizing food stamps for infant formula. MOB declined any additional referrals, stating she is currently in [facility] program. MOB expressed no additional needs at this time. ° °CSW identifies no further need for intervention and no barriers to discharge at this time. ° °Marcelyn Ruppe, LCSWA °Clinical Social Work °[facility] °[phone]  °

## 2021-11-25 NOTE — Anesthesia Postprocedure Evaluation (Signed)
Anesthesia Post Note  Patient: Hannah Mccarthy  Procedure(s) Performed: AN AD HOC LABOR EPIDURAL     Patient location during evaluation: Mother Baby Anesthesia Type: Epidural Level of consciousness: awake Pain management: satisfactory to patient Vital Signs Assessment: post-procedure vital signs reviewed and stable Respiratory status: spontaneous breathing Cardiovascular status: stable Anesthetic complications: no   No notable events documented.  Last Vitals:  Vitals:   11/24/21 2246 11/25/21 0331  BP: (!) 112/55 113/62  Pulse: 90 86  Resp: 18 16  Temp: 37.3 C 37.1 C  SpO2: 98% 100%    Last Pain:  Vitals:   11/25/21 0617  TempSrc:   PainSc: 4    Pain Goal: Patients Stated Pain Goal: 0 (11/24/21 [id number])                 Casimer Lanius

## 2021-11-26 MED ORDER — ACETAMINOPHEN 325 MG PO TABS: 650.0000 mg | ORAL_TABLET | ORAL | 0 refills | Status: AC | PRN

## 2021-11-26 MED ORDER — OXYCODONE HCL 5 MG PO TABS: 5.0000 mg | ORAL_TABLET | ORAL | 0 refills | Status: AC | PRN

## 2021-11-26 NOTE — BH Specialist Note (Signed)
Integrated Behavioral Health via Telemedicine Visit  11/26/2021 Hannah Mccarthy [id number]  Number of Integrated Behavioral Health visits: 5 Session Start time: 2:50  Session End time: 3:12 Total time:  22  Referring Provider: [name], NP Patient/Family location: Work [facility] Provider location: [facility] for [employer] [facility] [employer] [facility]  All persons participating in visit: Patient Hannah Mccarthy and [facility] Janett Kamath   Types of Service: Individual psychotherapy and Video visit  I connected with Hannah Mccarthy and/or Hannah Mccarthy  n/a  via  Telephone or Video Enabled Telemedicine Application  (Video is Caregility application) and verified that I am speaking with the correct person using two identifiers. Discussed confidentiality: Yes   I discussed the limitations of telemedicine and the availability of in person appointments.  Discussed there is a possibility of technology failure and discussed alternative modes of communication if that failure occurs.  I discussed that engaging in this telemedicine visit, they consent to the provision of behavioral healthcare and the services will be billed under their insurance.  Patient and/or legal guardian expressed understanding and consented to Telemedicine visit: Yes   Presenting Concerns: Patient and/or family reports the following symptoms/concerns: Traumatic birth experience, specific worries, irritability,sleep difficulty; mom and fiance supportive; feels going back to work today has been helpful Duration of problem: Ongoing; Severity of problem: moderate  Patient and/or Family's Strengths/Protective Factors: Social connections and Concrete supports in place (healthy food, safe environments, etc.)  Goals Addressed: Patient will:  Reduce symptoms of: anxiety and stress   Demonstrate ability to: Increase healthy adjustment to current life circumstances  Progress towards  Goals: Ongoing  Interventions: Interventions utilized:  Functional Assessment of ADLs, Psychoeducation and/or Health Education, and Supportive Reflection Standardized Assessments completed: GAD-7 and PHQ 9  Patient and/or Family Response: Pt agrees with ongoing treatment plan  Assessment: Patient currently experiencing Generalized anxiety disorder.   Patient may benefit from continued psychoeducation and brief therapeutic interventions regarding coping with symptoms of anxiety and current life stress .  Plan: Follow up with behavioral health clinician on : Two weeks; Call Hannah Mccarthy at [phone], as needed Behavioral recommendations:  -Continue accepting practical support from mom and fiance -Continue prioritizing healthy self-care (daily meals and sleep) daily until postpartum medical visit Referral(s): Integrated [employer] (In Clinic)  I discussed the assessment and treatment plan with the patient and/or parent/guardian. They were provided an opportunity to ask questions and all were answered. They agreed with the plan and demonstrated an understanding of the instructions.   They were advised to call back or seek an in-person evaluation if the symptoms worsen or if the condition fails to improve as anticipated.  Hannah Lips, LCSW  Depression screen [facility] 2/9 12/10/2021 11/20/2021 11/11/2021 11/01/2021 10/24/2021  Decreased Interest 0 0 0 1 1  Down, Depressed, Hopeless 1 0 0 0 0  PHQ - 2 Score 1 0 0 1 1  Altered sleeping 0 2 1 2 2   Tired, decreased energy 0 1 1 2 2   Change in appetite 0 0 0 0 0  Feeling bad or failure about yourself  0 0 0 0 0  Trouble concentrating 0 0 0 0 0  Moving slowly or fidgety/restless 0 0 0 0 0  Suicidal thoughts 0 0 0 0 0  PHQ-9 Score 1 3 2 5 5   Some recent data might be hidden   GAD 7 : Generalized Anxiety Score 12/10/2021 11/20/2021 11/11/2021 11/01/2021  Nervous, Anxious, on Edge 0 0 0 0  Control/stop  worrying 3 0 0 0  Worry too much  - different things 3 0 0 0  Trouble relaxing 0 0 0 0  Restless 0 0 0 0  Easily annoyed or irritable 3 0 0 0  Afraid - awful might happen 0 0 0 0  Total GAD 7 Score 9 0 0 0

## 2021-11-26 NOTE — Plan of Care (Signed)
Problem: Education: Goal: Knowledge of condition will improve Outcome: Completed/Met   Problem: Activity: Goal: Will verbalize the importance of balancing activity with adequate rest periods Outcome: Completed/Met Goal: Ability to tolerate increased activity will improve Outcome: Completed/Met   Problem: Life Cycle: Goal: Chance of risk for complications during the postpartum period will decrease Outcome: Completed/Met   Problem: Role Relationship: Goal: Ability to demonstrate positive interaction with newborn will improve Outcome: Completed/Met   Problem: Activity: Goal: Will verbalize the importance of balancing activity with adequate rest periods Outcome: Completed/Met Goal: Ability to tolerate increased activity will improve Outcome: Completed/Met   Problem: Life Cycle: Goal: Chance of risk for complications during the postpartum period will decrease Outcome: Completed/Met   Problem: Role Relationship: Goal: Ability to demonstrate positive interaction with newborn will improve Outcome: Completed/Met

## 2021-11-26 NOTE — Plan of Care (Signed)
Problem: Education: Goal: Knowledge of General Education information will improve Description: Including pain rating scale, medication(s)/side effects and non-pharmacologic comfort measures Outcome: Completed/Met   Problem: Clinical Measurements: Goal: Ability to maintain clinical measurements within normal limits will improve Outcome: Completed/Met Goal: Will remain free from infection Outcome: Completed/Met Goal: Diagnostic test results will improve Outcome: Completed/Met Goal: Respiratory complications will improve Outcome: Completed/Met Goal: Cardiovascular complication will be avoided Outcome: Completed/Met   Problem: Activity: Goal: Risk for activity intolerance will decrease Outcome: Completed/Met   Problem: Nutrition: Goal: Adequate nutrition will be maintained Outcome: Completed/Met   Problem: Elimination: Goal: Will not experience complications related to bowel motility Outcome: Completed/Met   Problem: Pain Managment: Goal: General experience of comfort will improve Outcome: Completed/Met   Problem: Education: Goal: Knowledge of condition will improve Outcome: Completed/Met Goal: Individualized Educational Video(s) Outcome: Completed/Met   Problem: Activity: Goal: Will verbalize the importance of balancing activity with adequate rest periods Outcome: Completed/Met Goal: Ability to tolerate increased activity will improve Outcome: Completed/Met   Problem: Life Cycle: Goal: Chance of risk for complications during the postpartum period will decrease Outcome: Completed/Met   Problem: Role Relationship: Goal: Ability to demonstrate positive interaction with newborn will improve Outcome: Completed/Met   Problem: Education: Goal: Knowledge of condition will improve Outcome: Completed/Met   Problem: Activity: Goal: Will verbalize the importance of balancing activity with adequate rest periods Outcome: Completed/Met Goal: Ability to tolerate increased  activity will improve Outcome: Completed/Met   Problem: Life Cycle: Goal: Chance of risk for complications during the postpartum period will decrease Outcome: Completed/Met   Problem: Role Relationship: Goal: Ability to demonstrate positive interaction with newborn will improve Outcome: Completed/Met

## 2021-11-27 ENCOUNTER — Telehealth: Payer: Self-pay

## 2021-11-27 ENCOUNTER — Encounter: Payer: Self-pay | Admitting: Certified Nurse Midwife

## 2021-11-27 DIAGNOSIS — Z9189 Other specified personal risk factors, not elsewhere classified: Secondary | ICD-10-CM

## 2021-11-27 NOTE — Telephone Encounter (Signed)
Transition Care Management Unsuccessful Follow-up Telephone Call  Date of discharge and from where:  11/26/2021 from [facility]  Attempts:  1st Attempt  Reason for unsuccessful TCM follow-up call:  Left voice message

## 2021-11-28 ENCOUNTER — Other Ambulatory Visit: Payer: Self-pay

## 2021-11-28 ENCOUNTER — Encounter: Payer: Self-pay | Admitting: Family Medicine

## 2021-11-29 ENCOUNTER — Inpatient Hospital Stay (HOSPITAL_COMMUNITY): Payer: Medicaid Other

## 2021-11-29 ENCOUNTER — Inpatient Hospital Stay (HOSPITAL_COMMUNITY)
Admission: AD | Admit: 2021-11-29 | Payer: Medicaid Other | Source: Home / Self Care | Admitting: Obstetrics and Gynecology

## 2021-11-29 NOTE — Telephone Encounter (Signed)
Transition Care Management Follow-up Telephone Call Date of discharge and from where: 11/26/2021-[facility]  How have you been since you were released from the hospital? Pt stated she is doing fine.  Any questions or concerns? No  Items Reviewed: Did the pt receive and understand the discharge instructions provided? Yes  Medications obtained and verified? Yes  Other? No  Any new allergies since your discharge? No  Dietary orders reviewed? No Do you have support at home? Yes   Home Care and Equipment/Supplies: Were home health services ordered? not applicable If so, what is the name of the agency? N/A  Has the agency set up a time to come to the patient's home? not applicable Were any new equipment or medical supplies ordered?  No What is the name of the medical supply agency? N/A Were you able to get the supplies/equipment? not applicable Do you have any questions related to the use of the equipment or supplies? No  Functional Questionnaire: (I = Independent and D = Dependent) ADLs: I  Bathing/Dressing- I  Meal Prep- I  Eating- I  Maintaining continence- I  Transferring/Ambulation- I  Managing Meds- I  Follow up appointments reviewed:  PCP Hospital f/u appt confirmed? No   Specialist Hospital f/u appt confirmed? Yes  Scheduled to see OBGYN on 12/10/2021 @ 2:45PM. Are transportation arrangements needed? No  If their condition worsens, is the pt aware to call PCP or go to the Emergency Dept.? Yes Was the patient provided with contact information for the PCP's office or ED? Yes Was to pt encouraged to call back with questions or concerns? Yes

## 2021-11-29 NOTE — Telephone Encounter (Signed)
Transition Care Management Unsuccessful Follow-up Telephone Call  Date of discharge and from where:  11/26/2021 from [facility]  Attempts:  2nd Attempt  Reason for unsuccessful TCM follow-up call:  Left voice message

## 2021-12-05 ENCOUNTER — Telehealth: Payer: Self-pay

## 2021-12-05 NOTE — Telephone Encounter (Signed)
° °  Telephone encounter was:  Unsuccessful.  12/05/2021 Name: Hannah Mccarthy MRN: [id number] DOB: 2001/05/13  Unsuccessful outbound call made today to assist with:   PCP  Outreach Attempt:  1st Attempt  A HIPAA compliant voice message was left requesting a return call.  Instructed patient to call back at [phone].  Thomes Burak Wan, AAS Paralegal, [facility] Care Guide  Embedded Care Coordination [place]   Care Management  [address] [place], [place] [address] ??[email or web address]   ?? [phone]   [email or web address]

## 2021-12-05 NOTE — Telephone Encounter (Signed)
° °  Telephone encounter was:  Successful.  12/05/2021 Name: Hannah Mccarthy MRN: [id number] DOB: May 25, 2001  Hannah Mccarthy is a 20 y.o. year old female who is a primary care patient of Pcp, No . The community resource team was consulted for assistance with  PCP  Care guide performed the following interventions: Spoke with patient about contacting [employer] [phone] to find out her assigned PCP. Patient stated she could call without assistance.  Follow Up Plan:  No further follow up planned at this time. The patient has been provided with needed resources.  Delno Blaisdell Neuzil, AAS Paralegal, [facility] Care Guide  Embedded Care Coordination [place]   Care Management  [address] [place], [place] [address] ??[email or web address]   ?? [phone]   [email or web address]

## 2021-12-09 ENCOUNTER — Telehealth (HOSPITAL_COMMUNITY): Payer: Self-pay | Admitting: *Deleted

## 2021-12-09 NOTE — Telephone Encounter (Signed)
Patient stated, "I need birth control. They were supposed to give it to me at the hospital, but they just discharged me." RN referred patient to her OB for birth control. Patient teary over the phone. Stated, "I have a bit of postpartum depression." Patient has an appointment tomorrow with Behavioral Health. Verbalized a plan to keep appointment. Denied suicidal ideation and homicidal ideation. EPDS not completed at this time. Patient voiced no other questions or concerns at this time. Patient voiced no questions or concerns regarding infant at this time. Patient reports infant sleeps on a pillow on her back. RN reviewed ABCs of safe sleep. Patient verbalized understanding. Patient requested RN email information on hospital's virtual postpartum classes and support groups. Email sent. Deforest Hoyles, RN, 12/09/21, [phone]

## 2021-12-10 ENCOUNTER — Ambulatory Visit (INDEPENDENT_AMBULATORY_CARE_PROVIDER_SITE_OTHER): Payer: Medicaid Other | Admitting: Clinical

## 2021-12-10 DIAGNOSIS — F411 Generalized anxiety disorder: Secondary | ICD-10-CM

## 2021-12-10 NOTE — Patient Instructions (Signed)
[facility] [address] [place], [place] [address] [phone] (main office) [phone] (Shameer Molstad's office)   

## 2021-12-11 NOTE — BH Specialist Note (Signed)
Integrated Behavioral Health via Telemedicine Visit  12/24/2021 Hannah Mccarthy [id number]  Number of Integrated Behavioral Health Clinician visits: 6-Sixth Visit  Session Start time: 1452   Session End time: 1510  Total time in minutes: 18   Referring Provider: [name], NP Patient/Family location: Work [facility] Provider location: [facility] [employer] [facility]  All persons participating in visit: Patient Hannah Mccarthy and Hannah Mccarthy   Types of Service: Individual psychotherapy and Telephone visit  I connected with Hannah Mccarthy and/or Hannah Mccarthy  n/a  via  Telephone or Video Enabled Telemedicine Application  (Video is Caregility application) and verified that I am speaking with the correct person using two identifiers. Discussed confidentiality: Yes   I discussed the limitations of telemedicine and the availability of in person appointments.  Discussed there is a possibility of technology failure and discussed alternative modes of communication if that failure occurs.  I discussed that engaging in this telemedicine visit, they consent to the provision of behavioral healthcare and the services will be billed under their insurance.  Patient and/or legal guardian expressed understanding and consented to Telemedicine visit: Yes   Presenting Concerns: Patient and/or family reports the following symptoms/concerns: Hopeful with move from part-time to full-time work soon will ease financial stress; irritability and worry has decreased; sleep is improving. Pt has no other concerns at this time.  Duration of problem: Postpartum; Severity of problem: mild  Patient and/or Family's Strengths/Protective Factors: Social connections, Concrete supports in place (healthy food, safe environments, etc.), Sense of purpose, and Physical Health (exercise, healthy diet, medication compliance, etc.)  Goals Addressed: Patient will:  Maintain reduction of  symptoms of: anxiety and stress    Demonstrate ability to: Increase healthy adjustment to current life circumstances  Progress towards Goals: Achieved  Interventions: Interventions utilized:  Supportive Reflection Standardized Assessments completed: GAD-7 and PHQ 9  Patient and/or Family Response: Pt agrees with treatment plan  Assessment: Patient currently experiencing Generalized anxiety disorder.   Patient may benefit from brief therapeutic intervention today.  Plan: Follow up with behavioral health clinician on : Call Hong Moring at [phone], as needed Behavioral recommendations:  -Continue healthy self-care daily (meals/sleep, etc.); accepting support from family/friends as needed -Establish with PCP of choice Referral(s): Integrated [employer] (In Clinic)  I discussed the assessment and treatment plan with the patient and/or parent/guardian. They were provided an opportunity to ask questions and all were answered. They agreed with the plan and demonstrated an understanding of the instructions.   They were advised to call back or seek an in-person evaluation if the symptoms worsen or if the condition fails to improve as anticipated.  Rae Lips, LCSW  Depression screen [facility] 2/9 12/24/2021 12/10/2021 11/20/2021 11/11/2021 11/01/2021  Decreased Interest 0 0 0 0 1  Down, Depressed, Hopeless 0 1 0 0 0  PHQ - 2 Score 0 1 0 0 1  Altered sleeping 0 0 2 1 2   Tired, decreased energy 1 0 1 1 2   Change in appetite 0 0 0 0 0  Feeling bad or failure about yourself  0 0 0 0 0  Trouble concentrating 0 0 0 0 0  Moving slowly or fidgety/restless 0 0 0 0 0  Suicidal thoughts 0 0 0 0 0  PHQ-9 Score 1 1 3 2 5   Some recent data might be hidden   GAD 7 : Generalized Anxiety Score 12/24/2021 12/10/2021 11/20/2021 11/11/2021  Nervous, Anxious, on Edge 0 0 0 0  Control/stop worrying 0 3 0 0  Worry too much - different things 3 3 0 0  Trouble relaxing 0 0 0 0  Restless 0 0 0 0   Easily annoyed or irritable 0 3 0 0  Afraid - awful might happen 0 0 0 0  Total GAD 7 Score 3 9 0 0

## 2021-12-24 ENCOUNTER — Ambulatory Visit (INDEPENDENT_AMBULATORY_CARE_PROVIDER_SITE_OTHER): Payer: Medicaid Other | Admitting: Clinical

## 2021-12-24 DIAGNOSIS — F411 Generalized anxiety disorder: Secondary | ICD-10-CM | POA: Diagnosis not present

## 2021-12-24 NOTE — Patient Instructions (Signed)
[facility] °[address] °[place], [place] [address] °[phone] (main office) °[phone] (Miraya Cudney's office) ° °[facility] possibly accepting new patients:  ° °[facility] °[address] °[place], [place] [address] °[phone] ° °[facility] °[address] °[place], [place] [address] [phone] ° °[facility] °[address] °[place], [place] [address] °[phone] ° °[facility] °[address] °[place], [place] [address] [phone] ° °[facility] °[address] °,  [place]  [address] °[phone] ° °

## 2022-01-03 ENCOUNTER — Encounter: Payer: Self-pay | Admitting: Family Medicine

## 2022-01-03 ENCOUNTER — Other Ambulatory Visit: Payer: Self-pay

## 2022-01-03 ENCOUNTER — Ambulatory Visit (INDEPENDENT_AMBULATORY_CARE_PROVIDER_SITE_OTHER): Payer: Medicaid Other | Admitting: Family Medicine

## 2022-01-03 VITALS — BP 108/72 | HR 75 | Wt 163.0 lb

## 2022-01-03 DIAGNOSIS — Z30013 Encounter for initial prescription of injectable contraceptive: Secondary | ICD-10-CM | POA: Diagnosis not present

## 2022-01-03 DIAGNOSIS — J4520 Mild intermittent asthma, uncomplicated: Secondary | ICD-10-CM

## 2022-01-03 DIAGNOSIS — J301 Allergic rhinitis due to pollen: Secondary | ICD-10-CM | POA: Diagnosis not present

## 2022-01-03 LAB — POCT PREGNANCY, URINE: Preg Test, Ur: NEGATIVE

## 2022-01-03 MED ORDER — ALBUTEROL SULFATE HFA 108 (90 BASE) MCG/ACT IN AERS: 2.0000 | INHALATION_SPRAY | RESPIRATORY_TRACT | 2 refills | Status: AC | PRN

## 2022-01-03 MED ORDER — CETIRIZINE HCL 10 MG PO TABS: 10.0000 mg | ORAL_TABLET | Freq: Every day | ORAL | 1 refills | Status: AC | PRN

## 2022-01-03 MED ORDER — BUDESONIDE-FORMOTEROL FUMARATE 160-4.5 MCG/ACT IN AERO: 2.0000 | INHALATION_SPRAY | Freq: Two times a day (BID) | RESPIRATORY_TRACT | 3 refills | Status: AC

## 2022-01-03 MED ORDER — MEDROXYPROGESTERONE ACETATE 150 MG/ML IM SUSP
150.0000 mg | Freq: Once | INTRAMUSCULAR | Status: AC
  Administered 2022-01-03: 150 mg via INTRAMUSCULAR

## 2022-01-03 NOTE — Progress Notes (Signed)
? ? ?Post Partum Visit Note ? ?Hannah Mccarthy is a 21 y.o. G35P1001 female who presents for a postpartum visit. She is  5.5  weeks postpartum following a normal spontaneous vaginal delivery.  I have fully reviewed the prenatal and intrapartum course. The delivery was at 40 gestational weeks.  Anesthesia: epidural. Postpartum course has been normal. Baby is doing well. Baby is feeding by bottle - Hannah Mccarthy . Bleeding no bleeding. Bowel function is normal. Bladder function is normal. Patient is not sexually active. Contraception method is none. Postpartum depression screening: negative. ? ? ?The pregnancy intention screening data noted above was reviewed. Potential methods of contraception were discussed. The patient elected to proceed with No data recorded. ? ? Edinburgh Postnatal Depression Scale - 01/03/22 1048   ? ?  ? Edinburgh Postnatal Depression Scale:  In the Past 7 Days  ? I have been able to laugh and see the funny side of things. 0   ? I have looked forward with enjoyment to things. 0   ? I have blamed myself unnecessarily when things went wrong. 0   ? I have been anxious or worried for no good reason. 0   ? I have felt scared or panicky for no good reason. 0   ? Things have been getting on top of me. 0   ? I have been so unhappy that I have had difficulty sleeping. 0   ? I have felt sad or miserable. 0   ? I have been so unhappy that I have been crying. 0   ? The thought of harming myself has occurred to me. 0   ? Edinburgh Postnatal Depression Scale Total 0   ? ?  ?  ? ?  ? ? ?Health Maintenance Due  ?Topic Date Due  ? HPV VACCINES (1 - 2-dose series) Never done  ? COVID-19 Vaccine (3 - Booster for Pfizer series) 01/29/2021  ? ? ?The following portions of the patient's history were reviewed and updated as appropriate: allergies, current medications, past family history, past medical history, past social history, past surgical history, and problem list. ? ?Review of Systems ?Pertinent items noted in HPI  and remainder of comprehensive ROS otherwise negative. ? ?Objective:  ?BP 108/72   Pulse 75   Wt 163 lb (73.9 kg)   LMP 02/17/2021   Breastfeeding No   BMI 27.12 kg/m?   ? ?General:  alert, cooperative, and appears stated age  ?Lungs: Normal effort  ?Heart:  regular rate and rhythm  ?Abdomen: soft, non-tender; bowel sounds normal; no masses,  no organomegaly   ?     ?Assessment:  ? ?Normal postpartum exam.  ? ?Plan:  ? ?Essential components of care per ACOG recommendations: ? ?1.  Mood and well being: Patient with negative depression screening today. Reviewed local resources for support.  ?- Patient tobacco use? No.   ?- hx of drug use? No.   ? ?2. Infant care and feeding:  ?-Patient currently breastmilk feeding? No.  ?-Social determinants of health (SDOH) reviewed in EPIC. No concerns ? ?3. Sexuality, contraception and birth spacing ?- Patient does not want a pregnancy in the next year.  Desired family size is unsure children.  ?- Reviewed reproductive life planning. Reviewed contraceptive methods based on pt preferences and effectiveness.  Patient desired Depo-Provera today.   ?- Discussed birth spacing of 18 months ? ?4. Sleep and fatigue ?-Encouraged family/partner/community support of 4 hrs of uninterrupted sleep to help with mood and fatigue ? ?  5. Physical Recovery  ?- Discussed patients delivery and complications. She describes her labor as bad. ?- Patient had a Vaginal, no problems at delivery. Patient had a 1st degree laceration. Perineal healing reviewed. Patient expressed understanding ?- Patient has urinary incontinence? No. ?- Patient is safe to resume physical and sexual activity ? ?6.  Health Maintenance ?- HM due items addressed Yes ?- Last pap smear  ?Diagnosis  ?Date Value Ref Range Status  ?05/13/2021   Final  ? - Negative for intraepithelial lesion or malignancy (NILM)  ? Pap smear not done at today's visit.  ?-Breast Cancer screening indicated? No.  ? ?7. Chronic Disease/Pregnancy Condition  follow up:  asthma and allergies--meds refilled ? ?- PCP follow up ? ?Hannah Bores, MD ?[facility] for [facility], [facility]  ?

## 2022-01-03 NOTE — Progress Notes (Signed)
Patient wants to start Depo Provera (contraception) today ? ? ?No other concerns ? ?Torria Fromer, CMA  ?

## 2022-01-18 ENCOUNTER — Encounter: Payer: Self-pay | Admitting: Obstetrics and Gynecology

## 2022-01-28 DIAGNOSIS — M549 Dorsalgia, unspecified: Secondary | ICD-10-CM | POA: Diagnosis not present

## 2022-01-28 DIAGNOSIS — R809 Proteinuria, unspecified: Secondary | ICD-10-CM | POA: Diagnosis not present

## 2022-01-28 DIAGNOSIS — R109 Unspecified abdominal pain: Secondary | ICD-10-CM | POA: Diagnosis not present

## 2022-01-28 DIAGNOSIS — R319 Hematuria, unspecified: Secondary | ICD-10-CM | POA: Diagnosis not present

## 2022-01-28 DIAGNOSIS — Z3490 Encounter for supervision of normal pregnancy, unspecified, unspecified trimester: Secondary | ICD-10-CM | POA: Diagnosis not present

## 2022-01-28 DIAGNOSIS — N181 Chronic kidney disease, stage 1: Secondary | ICD-10-CM | POA: Diagnosis not present

## 2022-01-28 DIAGNOSIS — G8929 Other chronic pain: Secondary | ICD-10-CM | POA: Diagnosis not present

## 2022-02-06 ENCOUNTER — Encounter: Payer: Self-pay | Admitting: Obstetrics and Gynecology

## 2022-02-10 ENCOUNTER — Telehealth: Payer: Self-pay | Admitting: General Practice

## 2022-02-10 NOTE — Telephone Encounter (Signed)
Called patient regarding mychart messages, no answer- left message to give [place] a call back. ?

## 2022-02-28 DIAGNOSIS — H5213 Myopia, bilateral: Secondary | ICD-10-CM | POA: Diagnosis not present

## 2022-03-05 ENCOUNTER — Encounter (HOSPITAL_COMMUNITY): Payer: Self-pay

## 2022-03-05 ENCOUNTER — Ambulatory Visit (HOSPITAL_COMMUNITY)
Admission: EM | Admit: 2022-03-05 | Discharge: 2022-03-05 | Disposition: A | Discharge status: Home / Self Care | Payer: Medicaid Other | Attending: Emergency Medicine | Admitting: Emergency Medicine

## 2022-03-05 DIAGNOSIS — J4521 Mild intermittent asthma with (acute) exacerbation: Secondary | ICD-10-CM

## 2022-03-05 DIAGNOSIS — R0789 Other chest pain: Secondary | ICD-10-CM | POA: Diagnosis not present

## 2022-03-05 MED ORDER — IPRATROPIUM-ALBUTEROL 0.5-2.5 (3) MG/3ML IN SOLN
3.0000 mL | Freq: Once | RESPIRATORY_TRACT | Status: AC
  Administered 2022-03-05: 3 mL via RESPIRATORY_TRACT

## 2022-03-05 MED ORDER — ALBUTEROL SULFATE (2.5 MG/3ML) 0.083% IN NEBU: 2.5000 mg | INHALATION_SOLUTION | Freq: Once | RESPIRATORY_TRACT | Status: DC

## 2022-03-05 MED ORDER — METHYLPREDNISOLONE SODIUM SUCC 125 MG IJ SOLR
60.0000 mg | Freq: Once | INTRAMUSCULAR | Status: AC
  Administered 2022-03-05: 60 mg via INTRAMUSCULAR

## 2022-03-05 MED ORDER — METHYLPREDNISOLONE SODIUM SUCC 125 MG IJ SOLR
INTRAMUSCULAR | Status: AC
  Filled 2022-03-05: qty 2

## 2022-03-05 MED ORDER — IPRATROPIUM-ALBUTEROL 20-100 MCG/ACT IN AERS
1.0000 | INHALATION_SPRAY | Freq: Four times a day (QID) | RESPIRATORY_TRACT | 1 refills | Status: AC
Start: 2022-03-05 — End: ?

## 2022-03-05 MED ORDER — PREDNISONE 20 MG PO TABS: 40.0000 mg | ORAL_TABLET | Freq: Every day | ORAL | 0 refills | Status: DC

## 2022-03-05 MED ORDER — IPRATROPIUM-ALBUTEROL 0.5-2.5 (3) MG/3ML IN SOLN
RESPIRATORY_TRACT | Status: AC
  Filled 2022-03-05: qty 3

## 2022-03-05 NOTE — Discharge Instructions (Addendum)
Today you are being treated for inflammation to your upper airways ? ?Your EKG showed that your heart is beating in a normal rhythm but slightly fast, your vital signs are stable and you are receiving 100% of air without any type of assistance, have a low suspicion that your heart is the cause of your symptoms today ? ?Begin use of prednisone every morning with food for 5 days to help reduce inflammation  ? ?Your inhaler has been changed to 1 with 2 medications ideally to give you better relief, you may use this inhaler taking 1 puff every 6 hours as needed ? ?For worsening signs of breathing please go to the nearest emergency department for evaluation ? ?In addition: ? ?Maintaining adequate hydration may help to thin secretions and soothe the respiratory mucosa  ? ?For additional comfort you may use a humidifier at bedtime to help keep the airways moistened, if you do not have a humidifier you may steam in the bathroom and sit inside for 5 to 10 minutes to help calm your breathing further ?

## 2022-03-05 NOTE — ED Provider Notes (Signed)
?[facility] ? ? ? ?CSN: [id number] ?Arrival date & time: 03/05/22  1009 ? ? ?  ? ?History   ?Chief Complaint ?No chief complaint on file. ? ? ?HPI ?Teyah Rossy is a 21 y.o. female.  ? ?Patient presents with sharp generalized chest pain radiating into the bilateral flanks for 1 day.  Chest pain has been constant.  Associated shortness of breath with rest and exertion.  Associated muscle spasming throughout the entirety of the body.  Denies wheezing, dizziness, lightheadedness, memory or speech changes, generalized weakness.  Has attempted use of her albuterol inhaler which has been ineffective.  History of asthma.  Similar symptoms have occurred in the past related to asthma. ? ?Past Medical History:  ?Diagnosis Date  ? Anxiety   ? Asthma   ? Back pain affecting pregnancy in third trimester 10/24/2021  ? Depression   ? Endometriosis   ? Moderate persistent asthma with acute exacerbation 02/03/2016  ? Renal disorder   ? Stage 1-  ? ? ?Patient Active Problem List  ? Diagnosis Date Noted  ? Sickle cell trait ([place]) 07/09/2021  ? Renal disorder   ? Endometriosis   ? Asthma   ? High serum bone-specific alkaline phosphatase 01/16/2020  ? Family history of diabetes mellitus (DM) 06/07/2019  ? 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 Proteinuria 08/04/2018  ? 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 GAD (generalized anxiety disorder) 06/02/2016  ? ? ?Past Surgical History:  ?Procedure Laterality Date  ? ABDOMINAL SURGERY    ? dx for endometriosis  ? MOUTH SURGERY    ? ? ?OB History   ? ? Gravida  ?1  ? Para  ?1  ? Term  ?1  ? Preterm  ?   ? AB  ?   ? Living  ?1  ?  ? ? SAB  ?   ? IAB  ?   ? Ectopic  ?   ? Multiple  ?0  ? Live Births  ?1  ?   ?  ?  ? ? ? ?Home Medications   ? ?Prior to Admission medications   ?Medication Sig Start Date End Date Taking? Authorizing Provider  ?albuterol (VENTOLIN HFA) 108 (90 Base) MCG/ACT inhaler Inhale 2 puffs  into the lungs every 4 (four) hours as needed for wheezing or shortness of breath. 01/03/22   Donnamae Jude, MD  ?Blood Pressure Monitoring (BLOOD PRESSURE KIT) DEVI 1 Device by Does not apply route as needed. ?Patient not taking: Reported on 11/11/2021 04/25/21   Luvenia Redden, PA-C  ?budesonide-formoterol [facility]) 160-4.5 MCG/ACT inhaler Inhale 2 puffs into the lungs 2 (two) times daily. 01/03/22   Donnamae Jude, MD  ?cetirizine (ZYRTEC) 10 MG tablet Take 1 tablet (10 mg total) by mouth daily as needed for allergies. 01/03/22   Donnamae Jude, MD  ?magnesium oxide (MAG-OX) 400 MG tablet Take 400 mg by mouth daily. ?Patient not taking: Reported on 01/03/2022    [provider]  ?[place]. Devices (GOJJI WEIGHT SCALE) MISC 1 Device by Does not apply route as needed. ?Patient not taking: Reported on 11/11/2021 04/25/21   Luvenia Redden, PA-C  ?Prenatal Vit-Fe Fumarate-FA (PRENATAL VITAMINS PO) Take 1 tablet by mouth daily.    [provider]  ? ? ?Family History ?Family History  ?Problem Relation Age of Onset  ? Hypertension Mother   ? Heart disease Mother   ? Asthma Father   ?  Diabetes Father   ? Diabetes Brother   ? ? ?Social History ?Social History  ? ?Tobacco Use  ? Smoking status: Never  ?  Passive exposure: Yes  ? Smokeless tobacco: Never  ?Vaping Use  ? Vaping Use: Former  ? Quit date: 03/05/2021  ? Substances: THC  ?Substance Use Topics  ? Alcohol use: No  ? Drug use: Never  ? ? ? ?Allergies   ?Escitalopram and Naproxen ? ? ?Review of Systems ?Review of Systems  ?Constitutional: Negative.   ?Respiratory:  Positive for cough and shortness of breath. Negative for apnea, choking, chest tightness, wheezing and stridor.   ?Cardiovascular:  Positive for chest pain. Negative for palpitations and leg swelling.  ?Gastrointestinal: Negative.   ?Skin: Negative.   ?Neurological:  Positive for headaches. Negative for dizziness, tremors, seizures, syncope, facial asymmetry, speech difficulty, weakness,  light-headedness and numbness.  ? ? ?Physical Exam ?Triage Vital Signs ?ED Triage Vitals  ?Enc Vitals Group  ?   BP 03/05/22 1126 121/82  ?   Pulse Rate 03/05/22 1126 (!) 111  ?   Resp 03/05/22 1126 18  ?   Temp 03/05/22 1126 98.9 ?F (37.2 ?C)  ?   Temp Source 03/05/22 1126 Oral  ?   SpO2 03/05/22 1126 99 %  ?   Weight --   ?   Height --   ?   Head Circumference --   ?   Peak Flow --   ?   Pain Score 03/05/22 1122 7  ?   Pain Loc --   ?   Pain Edu? --   ?   Excl. in [place]? --   ? ?No data found. ? ?Updated Vital Signs ?BP 121/82 (BP Location: Left Arm)   Pulse (!) 111   Temp 98.9 ?F (37.2 ?C) (Oral)   Resp 18   SpO2 99%   Breastfeeding No  ? ?Visual Acuity ?Right Eye Distance:   ?Left Eye Distance:   ?Bilateral Distance:   ? ?Right Eye Near:   ?Left Eye Near:    ?Bilateral Near:    ? ?Physical Exam ?Constitutional:   ?   Appearance: Normal appearance.  ?HENT:  ?   Head: Normocephalic.  ?Eyes:  ?   Extraocular Movements: Extraocular movements intact.  ?Cardiovascular:  ?   Rate and Rhythm: Regular rhythm. Tachycardia present.  ?   Pulses: Normal pulses.  ?   Heart sounds: Normal heart sounds.  ?Pulmonary:  ?   Effort: Pulmonary effort is normal.  ?   Breath sounds: Normal breath sounds.  ?Skin: ?   General: Skin is warm and dry.  ?Neurological:  ?   Mental Status: She is alert and oriented to person, place, and time. Mental status is at baseline.  ?Psychiatric:     ?   Mood and Affect: Mood normal.     ?   Behavior: Behavior normal.  ? ? ? ?UC Treatments / Results  ?Labs ?(all labs ordered are listed, but only abnormal results are displayed) ?Labs Reviewed - No data to display ? ?EKG ? ? ?Radiology ?No results found. ? ?Procedures ?Procedures (including critical care time) ? ?Medications Ordered in UC ?Medications - No data to display ? ?Initial Impression / Assessment and Plan / UC Course  ?I have reviewed the triage vital signs and the nursing notes. ? ?Pertinent labs & imaging results that were available during  my care of the patient were reviewed by me and considered in my medical decision making (  see chart for details). ? ?Chest tightness ?Mild intermittent asthma with acute exacerbation ? ?Vital signs are stable, mild tachycardia of 108, EKG showing sinus tachycardia, patient is in no signs of distress, lungs are clear to auscultation, methylprednisolone injection and DuoNeb given in office, patient endorses feeling slightly better after treatment, Combivent inhaler and prednisone 40 mg burst prescribed for outpatient management, given strict precautions for worsening symptoms to go to the nearest emergency department for further management and work-up, work note given ?Final Clinical Impressions(s) / UC Diagnoses  ? ?Final diagnoses:  ?None  ? ?Discharge Instructions   ?None ?  ? ?ED Prescriptions   ?None ?  ? ?PDMP not reviewed this encounter. ?  ?Hans Eden, NP ?03/05/22 1257 ? ?

## 2022-03-05 NOTE — ED Triage Notes (Signed)
Pt's states chest pain since yesterday. Pt states muscle spasms throughout her whole body alongside sharp pain throughout her body. Pt states a little SOB, and chest tightness.  ?

## 2022-03-17 DIAGNOSIS — H52223 Regular astigmatism, bilateral: Secondary | ICD-10-CM | POA: Diagnosis not present

## 2022-03-17 DIAGNOSIS — H5213 Myopia, bilateral: Secondary | ICD-10-CM | POA: Diagnosis not present

## 2022-03-21 ENCOUNTER — Ambulatory Visit (INDEPENDENT_AMBULATORY_CARE_PROVIDER_SITE_OTHER): Payer: Medicaid Other | Admitting: *Deleted

## 2022-03-21 VITALS — BP 117/70 | HR 86 | Ht 65.0 in | Wt 158.8 lb

## 2022-03-21 DIAGNOSIS — Z3042 Encounter for surveillance of injectable contraceptive: Secondary | ICD-10-CM | POA: Diagnosis not present

## 2022-03-21 MED ORDER — MEDROXYPROGESTERONE ACETATE 150 MG/ML IM SUSP
150.0000 mg | Freq: Once | INTRAMUSCULAR | Status: AC
  Administered 2022-03-21: 150 mg via INTRAMUSCULAR

## 2022-03-21 NOTE — Progress Notes (Signed)
Depo Provera 150 mg IM administered as scheduled.  Pt tolerated well. Next dose due 8/4-8/18. Annual Gyn exam due after 01/2023. Last pap on 05/13/21 was negative - next needed after 05/13/24. Pt had no questions and voiced understanding of information given.

## 2022-04-22 ENCOUNTER — Ambulatory Visit (HOSPITAL_COMMUNITY)
Admission: RE | Admit: 2022-04-22 | Discharge: 2022-04-22 | Disposition: A | Discharge status: Home / Self Care | Payer: Medicaid Other | Source: Ambulatory Visit | Attending: Internal Medicine | Admitting: Internal Medicine

## 2022-04-22 ENCOUNTER — Encounter (HOSPITAL_COMMUNITY): Payer: Self-pay

## 2022-04-22 ENCOUNTER — Other Ambulatory Visit: Payer: Self-pay

## 2022-04-22 VITALS — BP 131/85 | HR 86 | Temp 98.8°F | Resp 18

## 2022-04-22 DIAGNOSIS — J069 Acute upper respiratory infection, unspecified: Secondary | ICD-10-CM | POA: Insufficient documentation

## 2022-04-22 LAB — POCT RAPID STREP A, ED / UC: Streptococcus, Group A Screen (Direct): NEGATIVE

## 2022-04-22 MED ORDER — CETIRIZINE-PSEUDOEPHEDRINE ER 5-120 MG PO TB12: 1.0000 | ORAL_TABLET | Freq: Every day | ORAL | 0 refills | Status: AC

## 2022-04-22 MED ORDER — PROMETHAZINE-DM 6.25-15 MG/5ML PO SYRP: 5.0000 mL | ORAL_SOLUTION | Freq: Four times a day (QID) | ORAL | 0 refills | Status: DC | PRN

## 2022-04-22 MED ORDER — AZITHROMYCIN 250 MG PO TABS: 250.0000 mg | ORAL_TABLET | Freq: Every day | ORAL | 0 refills | Status: AC

## 2022-04-22 NOTE — Discharge Instructions (Signed)
Since your symptoms have been present for 7 days and there have been no signs of improvement we will start bacterial coverage, take azithromycin as directed  You may use cough syrup every 6 hours as needed, be mindful this medicine will make you drowsy   Begin use of Zyrtec-D which will help minimize the congestion that you are experiencing and continue use of Robitussin   you can take Tylenol and/or Ibuprofen as needed for fever reduction and pain relief.   For cough: honey 1/2 to 1 teaspoon (you can dilute the honey in water or another fluid).   You can use a humidifier for chest congestion and cough.  If you don't have a humidifier, you can sit in the bathroom with the hot shower running.      For sore throat: try warm salt water gargles, cepacol lozenges, throat spray, warm tea or water with lemon/honey, popsicles or ice, or OTC cold relief medicine for throat discomfort.   For congestion:  You can also use Flonase 1-2 sprays in each nostril daily.   It is important to stay hydrated: drink plenty of fluids (water, gatorade/powerade/pedialyte, juices, or teas) to keep your throat moisturized and help further relieve irritation/discomfort.

## 2022-04-22 NOTE — ED Triage Notes (Signed)
PT reports she lost her voice 4 days ago and she has a cough with sore throat.

## 2022-04-22 NOTE — ED Provider Notes (Signed)
[facility]    CSN: [id number] Arrival date & time: 04/22/22  1655      History   Chief Complaint Chief Complaint  Patient presents with   Cough    My throat is sore and under my breast near the top of my stomach hurts the slightest pressure to it and I'm coughing  til I'm throwing up and my body hurts and I'm catching headaches and I have no voice - Entered by patient   Muscle Pain   Hoarse    HPI Hannah Mccarthy is a 21 y.o. female.   Patient presents with chills, body aches, nasal congestion, rhinorrhea, sore throat, productive cough causing vomiting, generalized headaches for 7 days.  Endorses symptoms are worsening.  Decreased appetite with minimal food and fluid intake.  No known sick contacts.  Has attempted use of DayQuil and Robitussin which has been minimally helpful.  Denies shortness of breath and wheezing, ear pain, abdominal pain, diarrhea.  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 Authorizing Provider  albuterol (VENTOLIN HFA) 108 (90 Base) MCG/ACT inhaler Inhale 2 puffs into the lungs every 4 (four) hours as needed for wheezing or shortness of breath. 01/03/22   Donnamae Jude, MD  Blood Pressure Monitoring (BLOOD PRESSURE KIT) DEVI 1 Device by Does not apply route as needed. Patient not taking: Reported on 11/11/2021 04/25/21   Luvenia Redden, PA-C  budesonide-formoterol [facility]) 160-4.5 MCG/ACT inhaler Inhale 2 puffs into the lungs 2 (two) times daily. 01/03/22   Donnamae Jude, MD  cetirizine (ZYRTEC) 10 MG tablet Take 1 tablet (10 mg total) by mouth daily as needed for allergies. 01/03/22   Donnamae Jude, MD  Ipratropium-Albuterol (COMBIVENT) 20-100 MCG/ACT AERS respimat Inhale 1 puff into the lungs every 6 (six) hours. 03/05/22   Shenandoah Yeats, Leitha Schuller, NP  magnesium oxide (MAG-OX) 400 MG tablet Take 400 mg by mouth daily. Patient not taking: Reported on 01/03/2022    [provider]  Misc. Devices (GOJJI WEIGHT SCALE) MISC 1 Device by Does not apply route as needed. Patient not taking: Reported on 11/11/2021 04/25/21   Luvenia Redden, PA-C  Prenatal Vit-Fe Fumarate-FA (PRENATAL VITAMINS PO) Take 1 tablet by mouth daily. Patient not taking: Reported on 03/21/2022    [provider]    Family History Family History  Problem Relation Age of Onset   Hypertension Mother    Heart disease Mother    Asthma Father    Diabetes Father    Diabetes Brother     Social History Social History   Tobacco Use   Smoking status: Never    Passive exposure: Yes   Smokeless tobacco: Never  Vaping Use   Vaping Use: Former   Quit date: 03/05/2021   Substances: THC  Substance Use Topics   Alcohol use: No   Drug use: Never     Allergies   Escitalopram and Naproxen   Review of Systems Review of Systems  Constitutional:   Positive for chills. Negative for activity change, appetite change, diaphoresis, fatigue, fever and unexpected weight change.  HENT:  Positive for congestion, rhinorrhea, sore throat and voice change. Negative for dental problem, drooling, ear discharge, ear pain, facial swelling, hearing loss, mouth sores, nosebleeds, postnasal drip, sinus pressure, sinus pain, sneezing, tinnitus and trouble swallowing.   Respiratory:  Positive for cough. Negative for apnea, choking, chest tightness, shortness of breath, wheezing and stridor.   Cardiovascular: Negative.   Gastrointestinal:  Positive for vomiting. Negative for abdominal distention, abdominal pain, anal bleeding, blood in stool, constipation, diarrhea, nausea and rectal pain.  Musculoskeletal:  Positive for myalgias. Negative for arthralgias, back pain, gait problem, joint swelling, neck pain and neck stiffness.  Skin: Negative.   Neurological:  Positive for headaches. Negative for dizziness, tremors, seizures, syncope, facial asymmetry, speech difficulty, weakness, light-headedness and numbness.     Physical Exam Triage Vital Signs ED Triage Vitals  Enc Vitals Group     BP 04/22/22 1739 131/85     Pulse Rate 04/22/22 1739 86     Resp 04/22/22 1739 18     Temp 04/22/22 1739 98.8 F (37.1 C)     Temp src --      SpO2 04/22/22 1739 98 %     Weight --      Height --      Head Circumference --      Peak Flow --      Pain Score 04/22/22 1737 8     Pain Loc --      Pain Edu? --      Excl. in [place]? --    No data found.  Updated Vital Signs BP 131/85   Pulse 86   Temp 98.8 F (37.1 C)   Resp 18   SpO2 98%   Visual Acuity Right Eye Distance:   Left Eye Distance:   Bilateral Distance:    Right Eye Near:   Left Eye Near:    Bilateral Near:     Physical Exam Constitutional:      Appearance: Normal appearance.  HENT:     Head: Normocephalic.     Right Ear: Hearing, tympanic membrane, ear canal and external ear normal.     Left  Ear: Hearing, tympanic membrane, ear canal and external ear normal.     Nose: Congestion and rhinorrhea present.     Mouth/Throat:     Pharynx: Oropharynx is clear. No posterior oropharyngeal erythema.     Tonsils: No tonsillar  exudate. 0 on the right. 0 on the left.  Eyes:     Extraocular Movements: Extraocular movements intact.  Cardiovascular:     Rate and Rhythm: Normal rate and regular rhythm.     Pulses: Normal pulses.     Heart sounds: Normal heart sounds.  Pulmonary:     Effort: Pulmonary effort is normal.     Breath sounds: Normal breath sounds.  Musculoskeletal:     Cervical back: Normal range of motion and neck supple.  Skin:    General: Skin is warm and dry.  Neurological:     Mental Status: She is alert and oriented to person, place, and time. Mental status is at baseline.  Psychiatric:        Mood and Affect: Mood normal.        Behavior: Behavior normal.      UC Treatments / Results  Labs (all labs ordered are listed, but only abnormal results are displayed) Labs Reviewed - No data to display  EKG   Radiology No results found.  Procedures Procedures (including critical care time)  Medications Ordered in UC Medications - No data to display  Initial Impression / Assessment and Plan / UC Course  I have reviewed the triage vital signs and the nursing notes.  Pertinent labs & imaging results that were available during my care of the patient were reviewed by me and considered in my medical decision making (see chart for details).  Upper respiratory infection  Vital signs are stable and patient is in no signs of distress as symptoms have been present for 7 days with no improvement we will provide bacterial coverage, prescribed Z-Pak, prescribed Zyrtec-D and Promethazine DM for additional support as congestion and coughing are most worrisome symptoms today, may use additional medications as needed for supportive care with urgent care follow-up as needed, work  note given Final Clinical Impressions(s) / UC Diagnoses   Final diagnoses:  None   Discharge Instructions   None    ED Prescriptions   None    PDMP not reviewed this encounter.   Hans Eden, NP 04/22/22 1818

## 2022-04-25 LAB — CULTURE, GROUP A STREP (THRC)

## 2022-05-08 DIAGNOSIS — J4540 Moderate persistent asthma, uncomplicated: Secondary | ICD-10-CM | POA: Diagnosis not present

## 2022-05-08 DIAGNOSIS — Z202 Contact with and (suspected) exposure to infections with a predominantly sexual mode of transmission: Secondary | ICD-10-CM | POA: Diagnosis not present

## 2022-05-08 DIAGNOSIS — N898 Other specified noninflammatory disorders of vagina: Secondary | ICD-10-CM | POA: Diagnosis not present

## 2022-06-05 ENCOUNTER — Other Ambulatory Visit: Payer: Self-pay

## 2022-06-05 ENCOUNTER — Ambulatory Visit (INDEPENDENT_AMBULATORY_CARE_PROVIDER_SITE_OTHER): Payer: Medicaid Other | Admitting: General Practice

## 2022-06-05 VITALS — BP 119/82 | HR 69 | Ht 65.0 in | Wt 163.0 lb

## 2022-06-05 DIAGNOSIS — Z3042 Encounter for surveillance of injectable contraceptive: Secondary | ICD-10-CM | POA: Diagnosis not present

## 2022-06-05 MED ORDER — MEDROXYPROGESTERONE ACETATE 150 MG/ML IM SUSP
150.0000 mg | Freq: Once | INTRAMUSCULAR | Status: AC
  Administered 2022-06-05: 150 mg via INTRAMUSCULAR

## 2022-06-05 NOTE — Progress Notes (Signed)
Hannah Mccarthy here for Depo-Provera Injection. Injection administered without complication. Patient will return in 3 months for next injection between Oct 19 and Nov 2. Next annual visit due March 2024.   Marylynn Pearson, RN 06/05/2022  2:51 PM

## 2022-06-06 ENCOUNTER — Ambulatory Visit: Payer: Medicaid Other

## 2022-08-21 ENCOUNTER — Ambulatory Visit: Payer: Medicaid Other

## 2022-08-22 ENCOUNTER — Ambulatory Visit: Payer: Medicaid Other

## 2022-08-28 DIAGNOSIS — N938 Other specified abnormal uterine and vaginal bleeding: Secondary | ICD-10-CM | POA: Diagnosis not present

## 2022-08-28 DIAGNOSIS — Z3202 Encounter for pregnancy test, result negative: Secondary | ICD-10-CM | POA: Diagnosis not present

## 2022-08-28 DIAGNOSIS — N809 Endometriosis, unspecified: Secondary | ICD-10-CM | POA: Diagnosis not present

## 2023-01-13 DIAGNOSIS — N939 Abnormal uterine and vaginal bleeding, unspecified: Secondary | ICD-10-CM | POA: Diagnosis not present

## 2023-01-13 DIAGNOSIS — Z3202 Encounter for pregnancy test, result negative: Secondary | ICD-10-CM | POA: Diagnosis not present

## 2023-03-12 ENCOUNTER — Telehealth: Payer: Medicaid Other | Admitting: Nurse Practitioner

## 2023-03-12 DIAGNOSIS — Z20822 Contact with and (suspected) exposure to covid-19: Secondary | ICD-10-CM

## 2023-03-12 NOTE — Progress Notes (Signed)
Virtual Visit Consent   Hannah Mccarthy, you are scheduled for a virtual visit with a [facility] provider today. Just as with appointments in the office, your consent must be obtained to participate. Your consent will be active for this visit and any virtual visit you may have with one of our providers in the next 365 days. If you have a MyChart account, a copy of this consent can be sent to you electronically.  As this is a virtual visit, video technology does not allow for your provider to perform a traditional examination. This may limit your provider's ability to fully assess your condition. If your provider identifies any concerns that need to be evaluated in person or the need to arrange testing (such as labs, EKG, etc.), we will make arrangements to do so. Although advances in technology are sophisticated, we cannot ensure that it will always work on either your end or our end. If the connection with a video visit is poor, the visit may have to be switched to a telephone visit. With either a video or telephone visit, we are not always able to ensure that we have a secure connection.  By engaging in this virtual visit, you consent to the provision of healthcare and authorize for your insurance to be billed (if applicable) for the services provided during this visit. Depending on your insurance coverage, you may receive a charge related to this service.  I need to obtain your verbal consent now. Are you willing to proceed with your visit today? Hannah Mccarthy has provided verbal consent on 03/12/2023 for a virtual visit (video or telephone). Viviano Simas, FNP  Date: 03/12/2023 2:15 PM  Virtual Visit via Video Note   I, Viviano Simas, connected with  Hannah Mccarthy  ([id number], 02/03/2001) on 03/12/23 at  2:15 PM EDT by a video-enabled telemedicine application and verified that I am speaking with the correct person using two identifiers.  Location: Patient: Virtual Visit Location Patient: Home Provider:  Virtual Visit Location Provider: Home Office   I discussed the limitations of evaluation and management by telemedicine and the availability of in person appointments. The patient expressed understanding and agreed to proceed.    History of Present Illness: Hannah Mccarthy is a 22 y.o. who identifies as a female who was assigned female at birth, and is being seen today after testing positive for COVID.  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   Patient does have a HA and ST no fever Has not taken a test  Does not have testing at home  She has had COVID in the past without complications  No hospitalization for COVID in the past    Problems:  Patient Active Problem List   Diagnosis Date Noted   Sickle cell trait ([facility]) 07/09/2021   Renal disorder    Endometriosis    Asthma    High serum bone-specific alkaline phosphatase 01/16/2020   Family history of diabetes mellitus (DM) 06/07/2019   Severe episode of recurrent major depressive disorder, without psychotic features ([facility]) 06/07/2019   Proteinuria 08/04/2018   Idiopathic scoliosis of thoracic spine 07/13/2018   GAD (generalized anxiety disorder) 06/02/2016    Allergies:  Allergies  Allergen Reactions   Escitalopram Itching and Nausea Only   Naproxen Itching, Other (See Comments) and Anxiety    Heaving menstrual bleeding   Medications:  Current Outpatient Medications:    albuterol (VENTOLIN HFA)  108 (90 Base) MCG/ACT inhaler, Inhale 2 puffs into the lungs every 4 (four) hours as needed for wheezing or shortness of breath., Disp: 1 each, Rfl: 2   azithromycin (ZITHROMAX) 250 MG tablet, Take 1 tablet (250 mg total) by mouth daily. Take first 2 tablets together, then 1 every day until finished., Disp: 6 tablet, Rfl: 0   Blood Pressure Monitoring (BLOOD PRESSURE KIT) DEVI, 1 Device by Does not apply route as needed. (Patient not taking: Reported on  11/11/2021), Disp: 1 each, Rfl: 0   budesonide-formoterol (SYMBICORT) 160-4.5 MCG/ACT inhaler, Inhale 2 puffs into the lungs 2 (two) times daily., Disp: 1 each, Rfl: 3   cetirizine (ZYRTEC) 10 MG tablet, Take 1 tablet (10 mg total) by mouth daily as needed for allergies., Disp: 90 tablet, Rfl: 1   cetirizine-pseudoephedrine (ZYRTEC-D) 5-120 MG tablet, Take 1 tablet by mouth daily., Disp: 30 tablet, Rfl: 0   Ipratropium-Albuterol (COMBIVENT) 20-100 MCG/ACT AERS respimat, Inhale 1 puff into the lungs every 6 (six) hours., Disp: 4 g, Rfl: 1   magnesium oxide (MAG-OX) 400 MG tablet, Take 400 mg by mouth daily. (Patient not taking: Reported on 01/03/2022), Disp: , Rfl:    Misc. Devices (GOJJI WEIGHT SCALE) MISC, 1 Device by Does not apply route as needed. (Patient not taking: Reported on 11/11/2021), Disp: 1 each, Rfl: 0   Prenatal Vit-Fe Fumarate-FA (PRENATAL VITAMINS PO), Take 1 tablet by mouth daily. (Patient not taking: Reported on 03/21/2022), Disp: , Rfl:    promethazine-dextromethorphan (PROMETHAZINE-DM) 6.25-15 MG/5ML syrup, Take 5 mLs by mouth 4 (four) times daily as needed for cough., Disp: 118 mL, Rfl: 0  Observations/Objective: Patient is well-developed, well-nourished in no acute distress.  Resting comfortably  at home.  Head is normocephalic, atraumatic.  No labored breathing.  Speech is clear and coherent with logical content.  Patient is alert and oriented at baseline.    Assessment and Plan: 1. Exposure to COVID-19 virus Recommend home COVID testing  Sent a work note as requested and requested patient to follow up with positive testing   May continue to manage symptoms with OTC medications as discussed Tylenol as directed for ST and headache as needed    Follow Up Instructions: I discussed the assessment and treatment plan with the patient. The patient was provided an opportunity to ask questions and all were answered. The patient agreed with the plan and demonstrated an  understanding of the instructions.  A copy of instructions were sent to the patient via MyChart unless otherwise noted below.    The patient was advised to call back or seek an in-person evaluation if the symptoms worsen or if the condition fails to improve as anticipated.  Time:  I spent 10 minutes with the patient via telehealth technology discussing the above problems/concerns.    Viviano Simas, FNP

## 2023-03-25 DIAGNOSIS — H52223 Regular astigmatism, bilateral: Secondary | ICD-10-CM | POA: Diagnosis not present

## 2023-04-07 DIAGNOSIS — T148XXA Other injury of unspecified body region, initial encounter: Secondary | ICD-10-CM | POA: Diagnosis not present

## 2023-04-23 IMAGING — US US OB < 14 WEEKS - US OB TV
2 series · 13 of 28 positions shown · non-contrast
Comparison: None.

CLINICAL DATA: Abdominal pain for 2 weeks. Unsure of dates.
Quantitative beta hCG 893.

EXAM:
OBSTETRIC <14 WK US AND TRANSVAGINAL OB US
TECHNIQUE: Both transabdominal and transvaginal ultrasound examinations were
performed for complete evaluation of the gestation as well as the
maternal uterus, adnexal regions, and pelvic cul-de-sac.
Transvaginal technique was performed to assess early pregnancy.

[Series 1: us ob < 14 weeks - us ob tv · 2 of 164 slices shown (1 of 2)]
[im 55/164]
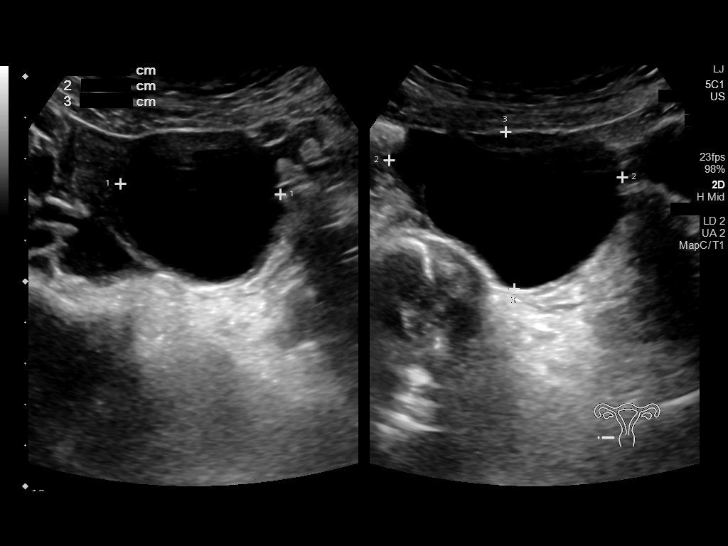
[im 164/164]
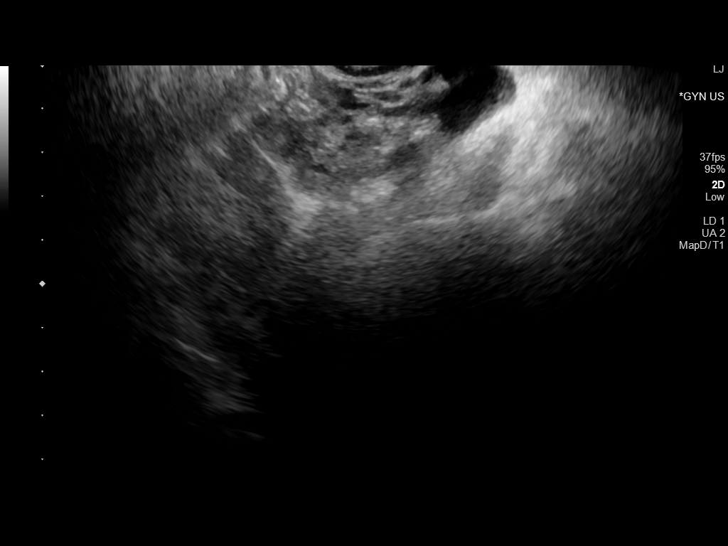

[Series 1: us ob < 14 weeks - us ob tv · 11 of 946 slices shown (2 of 2)]
[im 42/946]
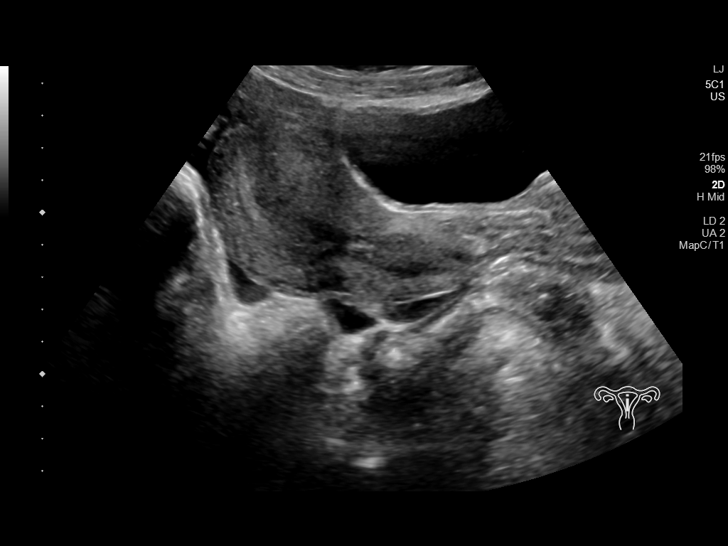
[im 124/946]
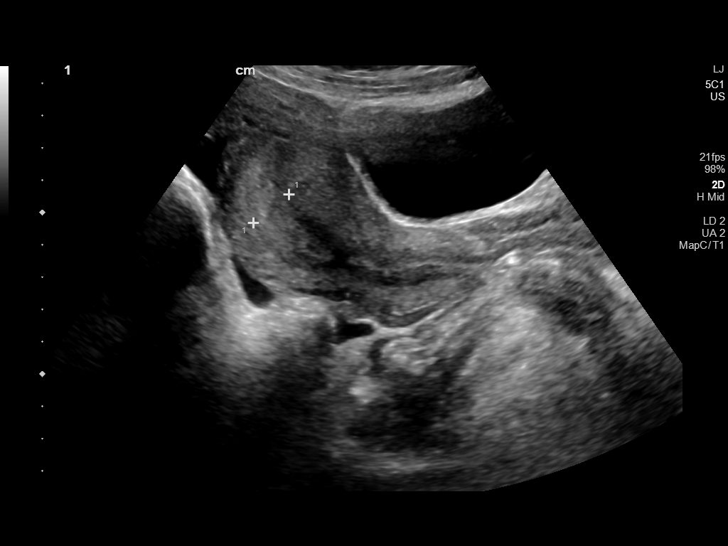
[im 206/946]
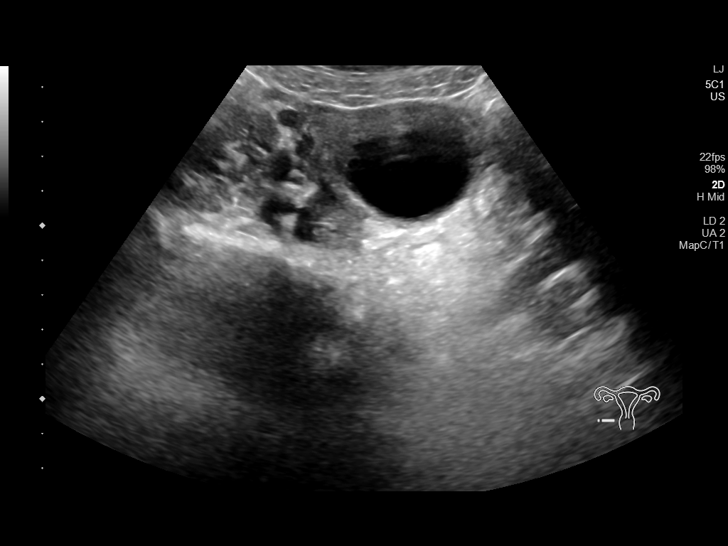
[im 288/946]
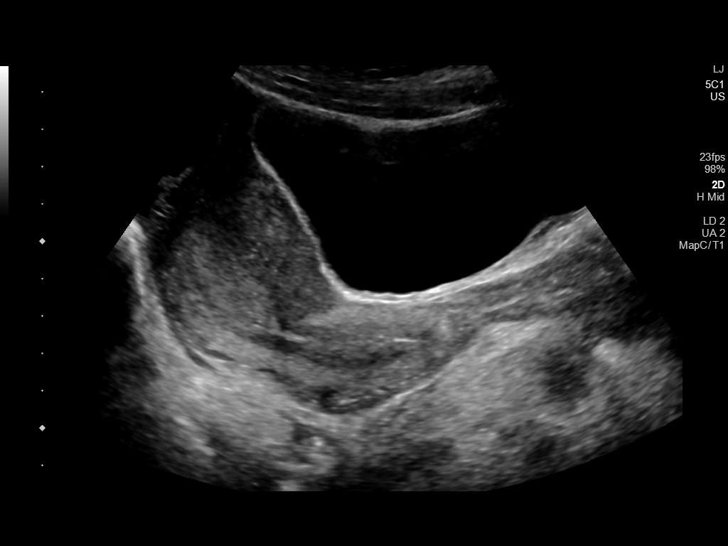
[im 411/946]
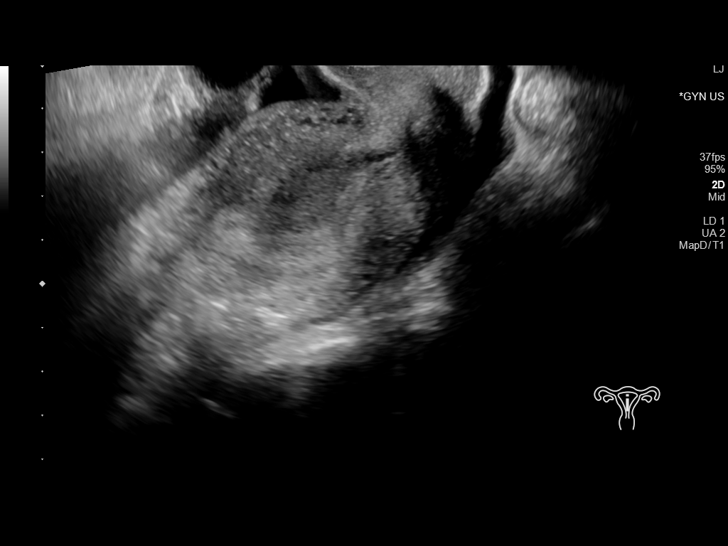
[im 494/946]
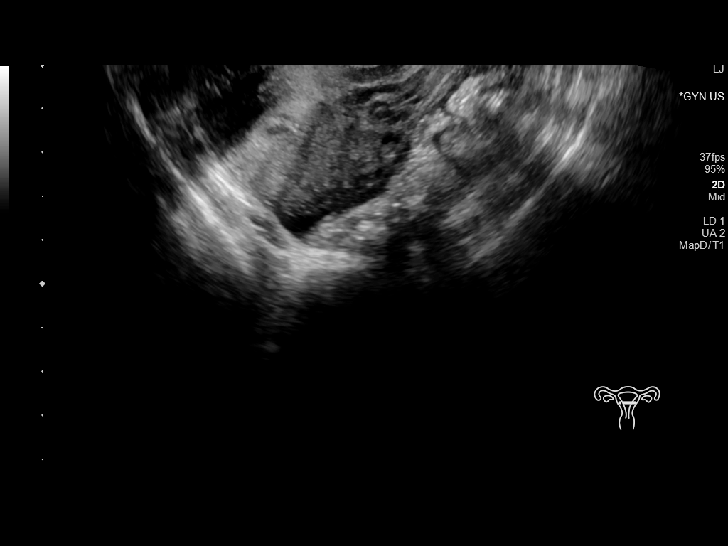
[im 576/946]
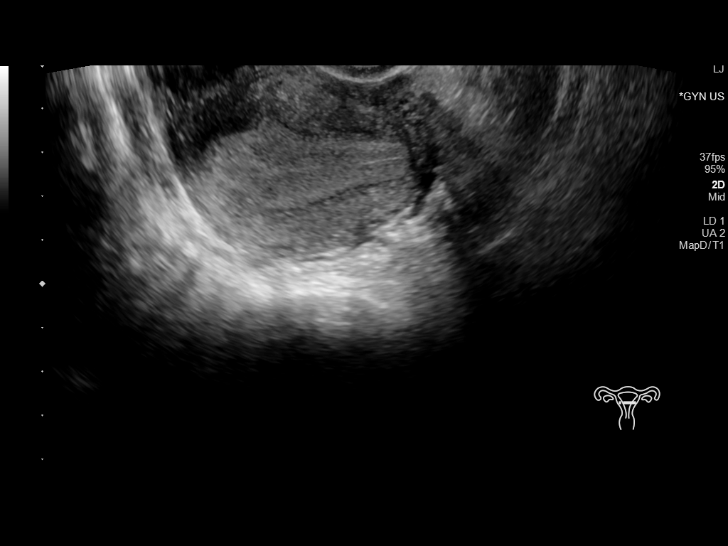
[im 658/946]
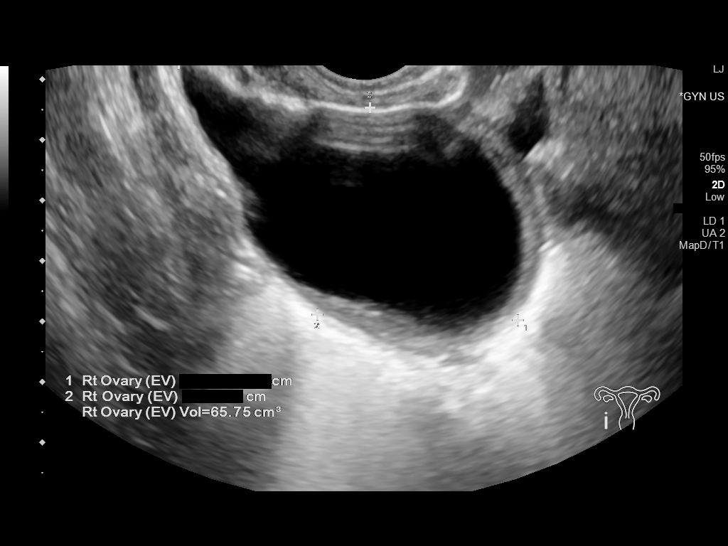
[im 740/946]
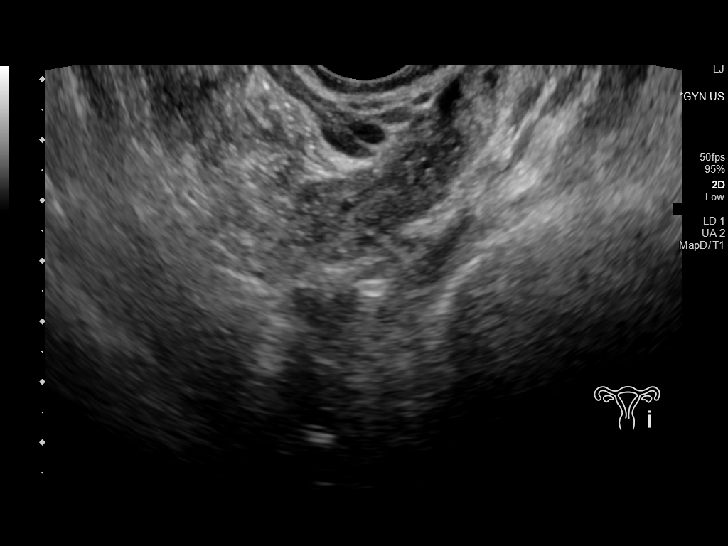
[im 822/946]
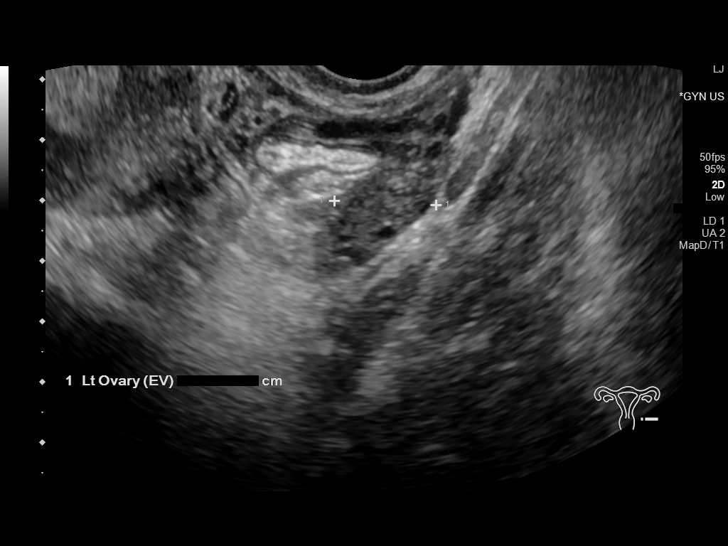
[im 904/946]
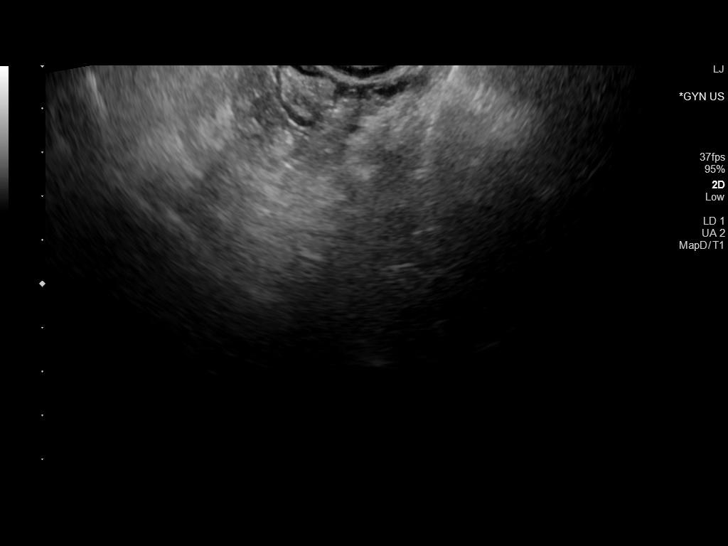

[13 of 28 positions shown; findings below may reference images not displayed]

FINDINGS: Intrauterine gestational sac: None

Yolk sac:  Not Visualized.

Embryo:  Not Visualized.

Cardiac Activity: Not Visualized.

Subchorionic hemorrhage:  None visualized.

Maternal uterus/adnexae:

Right ovary: There is a well-circumscribed, anechoic structure
measuring 3.8 x 6.0 x 3.3 cm. There is no internal septation or
mural nodule. There is some echogenic debris layering within the
dependent portion of this structure likely reflecting resolving
hemorrhage.

Left ovary: Normal

Other :None

Free fluid:  Trace
IMPRESSION: 1. No intrauterine gestational sac, yolk sac, or fetal pole
identified. Differential considerations include intrauterine
pregnancy too early to be sonographically visualized, missed
abortion, or ectopic pregnancy. Followup ultrasound is recommended
in 10-14 days for further evaluation.

2. Large right ovary cyst with long axis measuring up to 6 cm.
Although mostly anechoic without septation or mural nodules, there
is some resolving debris layering dependently. Recommend follow-up
US in 3-6 months. Note: This recommendation does not apply to
premenarchal patients or to those with increased risk (genetic,
family history, elevated tumor markers or other high-risk factors)
of ovarian cancer. Reference: Radiology [DATE]):[id number].

## 2023-04-25 IMAGING — US US OB TRANSVAGINAL
1 series · 15 of 28 positions shown · non-contrast
Comparison: 03/18/2021

CLINICAL DATA: Abdominal pain in early pregnancy

EXAM:
TRANSVAGINAL OB ULTRASOUND
TECHNIQUE: Transvaginal ultrasound was performed for complete evaluation of the
gestation as well as the maternal uterus, adnexal regions, and
pelvic cul-de-sac.

[Series 1: us ob transvaginal · 15 of 34 slices shown]
[im 1/34]
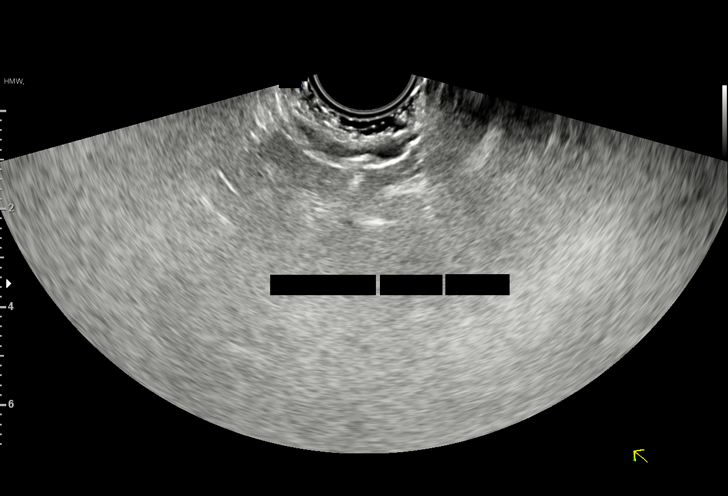
[im 3/34]
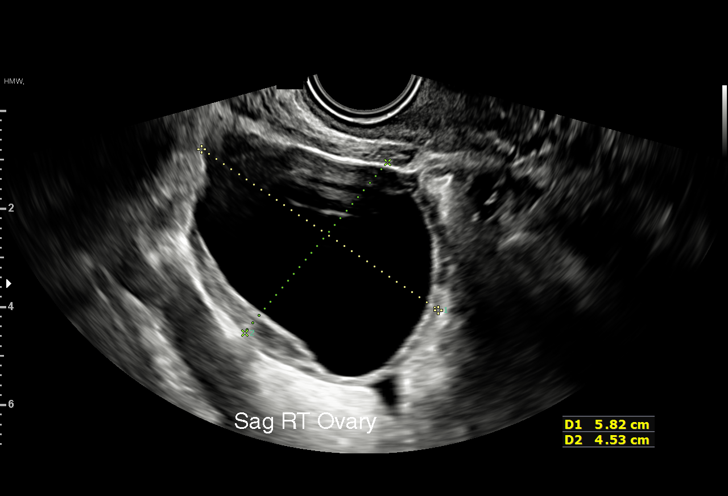
[im 5/34]
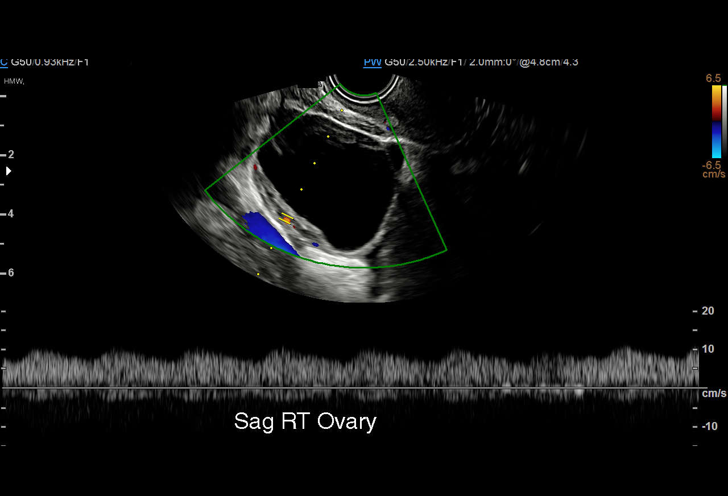
[im 8/34]
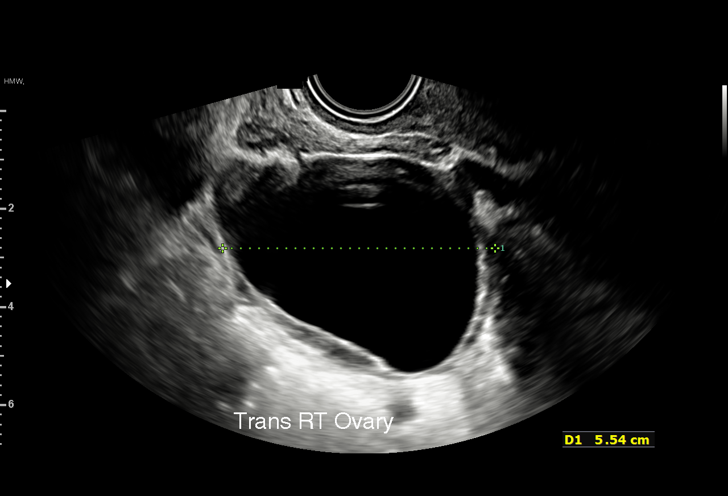
[im 10/34]
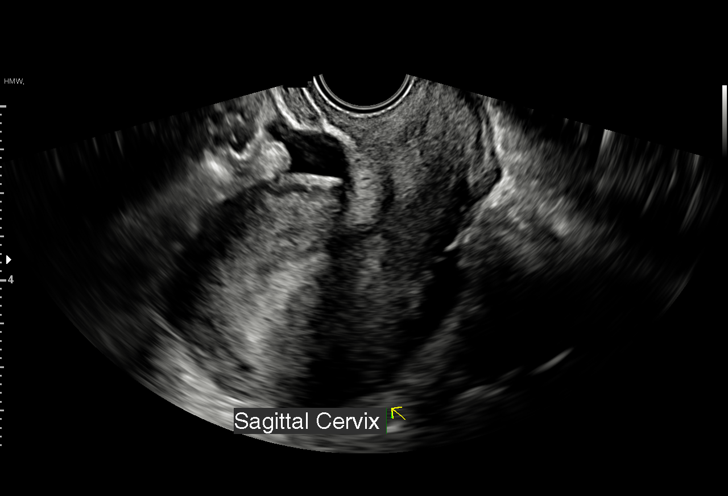
[im 13/34]
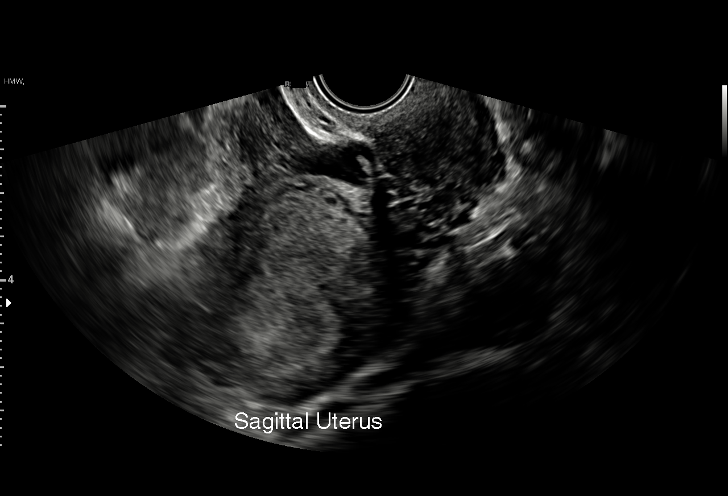
[im 15/34]
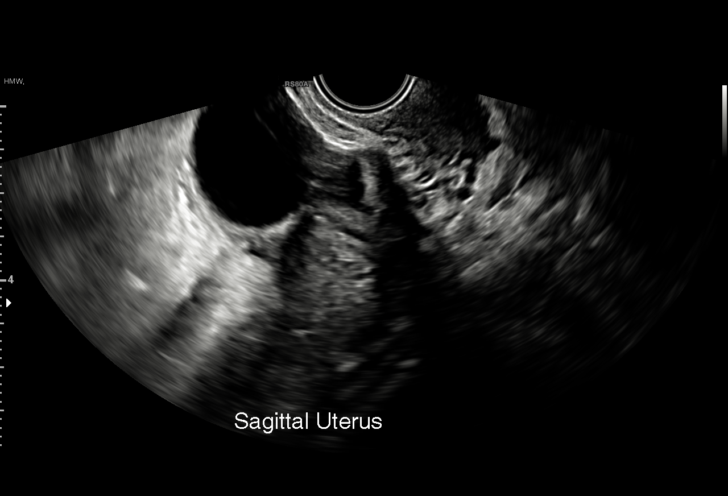
[im 18/34]
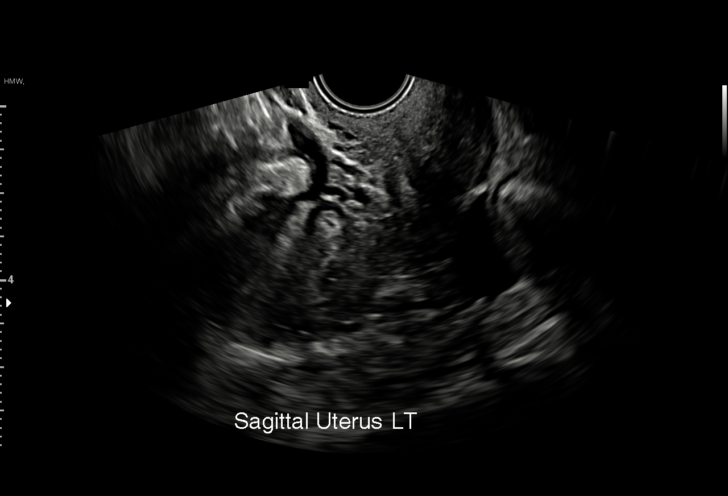
[im 19/34]
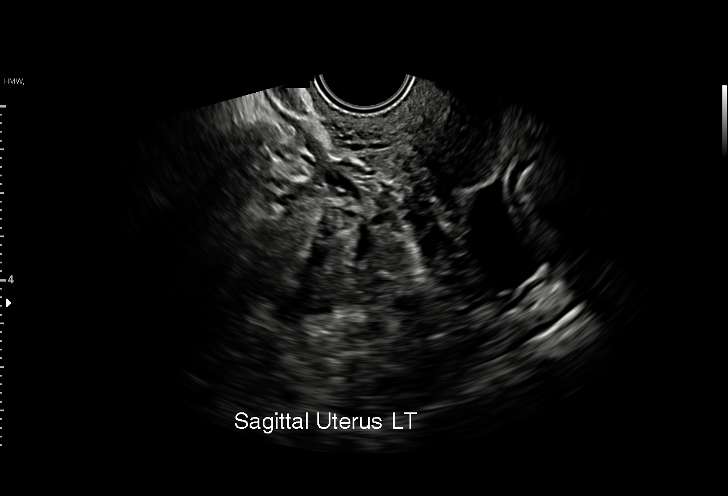
[im 21/34]
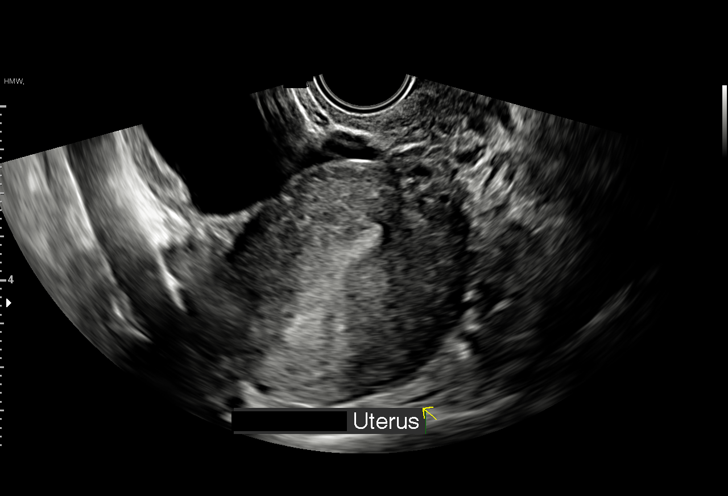
[im 24/34]
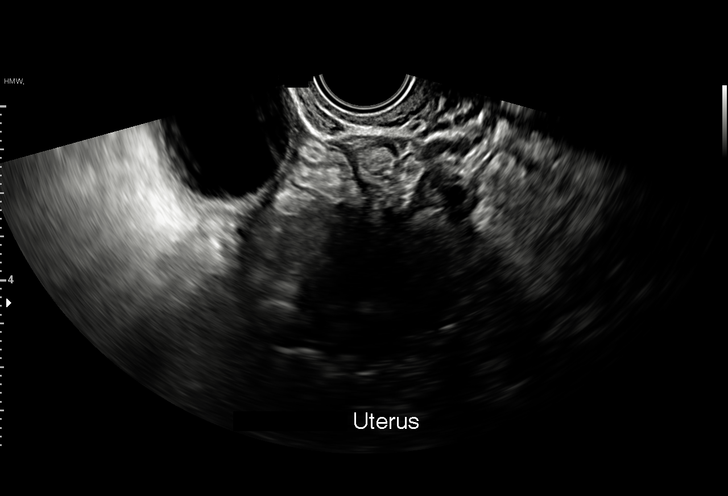
[im 26/34]
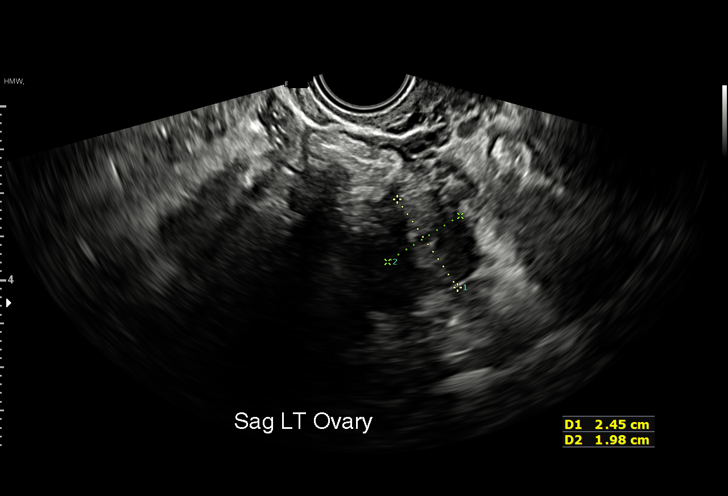
[im 29/34]
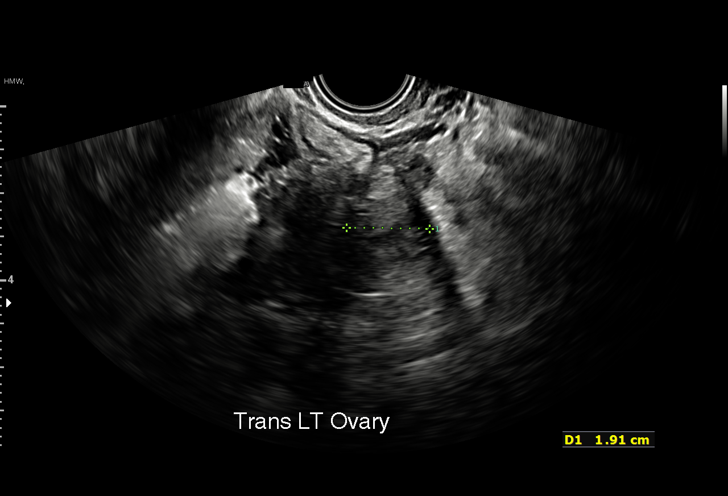
[im 31/34]
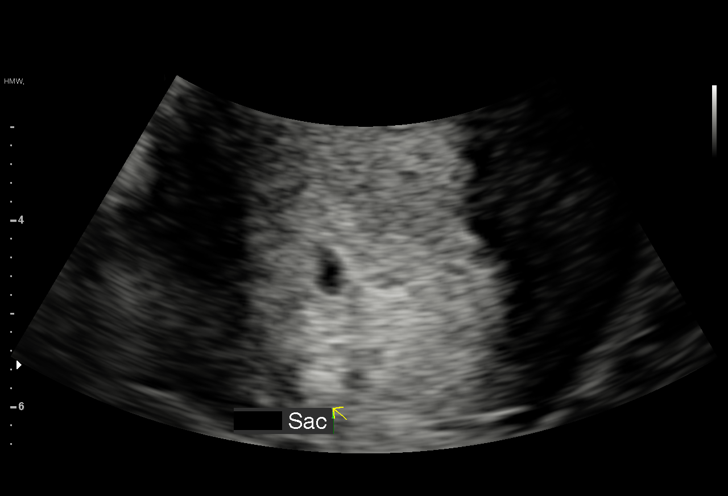
[im 34/34]
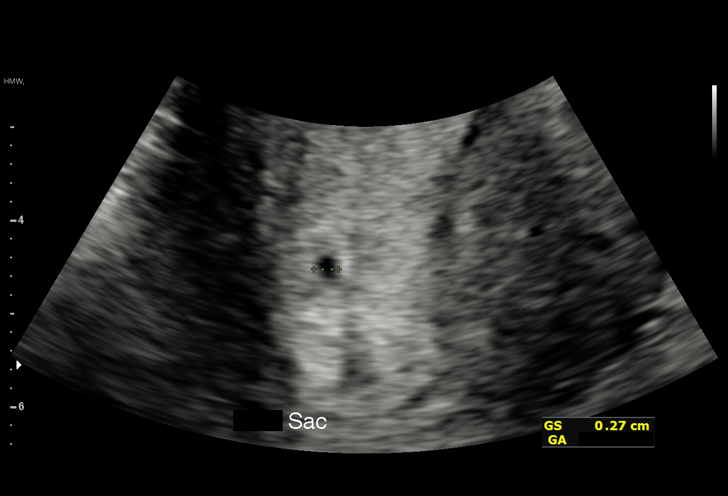

[15 of 28 positions shown; findings below may reference images not displayed]

FINDINGS: Intrauterine gestational sac: Single

Yolk sac:  Not Visualized.

Embryo:  Not Visualized.

Cardiac Activity: Not Visualized.

MSD: 4.0 mm   5 w   1 d

Subchorionic hemorrhage:  None visualized.

Maternal uterus/adnexae:

Right ovary: Right ovary cyst is identified. On today's exam this
measures 4.9 x 4.7 x 3.5 cm (volume = 42 cm^3). There is no
internal septation or mural nodularity. Layering debris is no longer
visualized. Previously this measured 3.8 x 6.0 x 3.3 cm (volume = 39
cm^3)

Left ovary: Normal

Other :None

Free fluid:  Trace free fluid noted.
IMPRESSION: 1. Probable early intrauterine gestational sac, but no yolk sac,
fetal pole, or cardiac activity yet visualized. Recommend follow-up
quantitative B-HCG levels and follow-up US in 14 days to confirm and
assess viability. This recommendation follows SRU consensus
guidelines: Diagnostic Criteria for Nonviable Pregnancy Early in the
First Trimester. N Engl J Med 8263; [DATE].
2. No significant change in the appearance of simple appearing,
anechoic cyst within the right ovary which has a maximum length of
4.9 cm on today's exam. No follow up imaging recommended. Note: This
recommendation does not apply to premenarchal patients or to those
with increased risk (genetic, family history, elevated tumor markers
or other high-risk factors) of ovarian cancer. Reference: Radiology
[DATE]):[id number].

## 2023-04-28 DIAGNOSIS — N809 Endometriosis, unspecified: Secondary | ICD-10-CM | POA: Diagnosis not present

## 2023-04-28 DIAGNOSIS — S40021A Contusion of right upper arm, initial encounter: Secondary | ICD-10-CM | POA: Diagnosis not present

## 2023-04-28 DIAGNOSIS — T148XXA Other injury of unspecified body region, initial encounter: Secondary | ICD-10-CM | POA: Diagnosis not present

## 2023-04-28 DIAGNOSIS — B370 Candidal stomatitis: Secondary | ICD-10-CM | POA: Diagnosis not present

## 2023-05-01 DIAGNOSIS — R109 Unspecified abdominal pain: Secondary | ICD-10-CM | POA: Diagnosis not present

## 2023-05-01 DIAGNOSIS — G8929 Other chronic pain: Secondary | ICD-10-CM | POA: Diagnosis not present

## 2023-05-01 DIAGNOSIS — M549 Dorsalgia, unspecified: Secondary | ICD-10-CM | POA: Diagnosis not present

## 2023-05-01 DIAGNOSIS — R809 Proteinuria, unspecified: Secondary | ICD-10-CM | POA: Diagnosis not present

## 2023-05-01 DIAGNOSIS — R319 Hematuria, unspecified: Secondary | ICD-10-CM | POA: Diagnosis not present

## 2023-05-01 DIAGNOSIS — N181 Chronic kidney disease, stage 1: Secondary | ICD-10-CM | POA: Diagnosis not present

## 2023-05-11 DIAGNOSIS — R319 Hematuria, unspecified: Secondary | ICD-10-CM | POA: Diagnosis not present

## 2023-05-13 DIAGNOSIS — R319 Hematuria, unspecified: Secondary | ICD-10-CM | POA: Diagnosis not present

## 2023-08-07 DIAGNOSIS — N926 Irregular menstruation, unspecified: Secondary | ICD-10-CM | POA: Diagnosis not present

## 2023-08-09 DIAGNOSIS — N809 Endometriosis, unspecified: Secondary | ICD-10-CM | POA: Diagnosis not present

## 2023-10-10 ENCOUNTER — Telehealth: Payer: 59 | Admitting: Nurse Practitioner

## 2023-10-10 DIAGNOSIS — J4520 Mild intermittent asthma, uncomplicated: Secondary | ICD-10-CM | POA: Diagnosis not present

## 2023-10-10 MED ORDER — IBUPROFEN 800 MG PO TABS: 800.0000 mg | ORAL_TABLET | Freq: Three times a day (TID) | ORAL | 0 refills | Status: AC | PRN

## 2023-10-10 MED ORDER — PSEUDOEPH-BROMPHEN-DM 30-2-10 MG/5ML PO SYRP: 5.0000 mL | ORAL_SOLUTION | Freq: Four times a day (QID) | ORAL | 0 refills | Status: AC | PRN

## 2023-10-10 MED ORDER — PREDNISONE 20 MG PO TABS: 20.0000 mg | ORAL_TABLET | Freq: Every day | ORAL | 0 refills | Status: AC

## 2023-10-10 NOTE — Progress Notes (Signed)
Virtual Visit Consent   Hannah Mccarthy, you are scheduled for a virtual visit with a [facility] provider today. Just as with appointments in the office, your consent must be obtained to participate. Your consent will be active for this visit and any virtual visit you may have with one of our providers in the next 365 days. If you have a MyChart account, a copy of this consent can be sent to you electronically.  As this is a virtual visit, video technology does not allow for your provider to perform a traditional examination. This may limit your provider's ability to fully assess your condition. If your provider identifies any concerns that need to be evaluated in person or the need to arrange testing (such as labs, EKG, etc.), we will make arrangements to do so. Although advances in technology are sophisticated, we cannot ensure that it will always work on either your end or our end. If the connection with a video visit is poor, the visit may have to be switched to a telephone visit. With either a video or telephone visit, we are not always able to ensure that we have a secure connection.  By engaging in this virtual visit, you consent to the provision of healthcare and authorize for your insurance to be billed (if applicable) for the services provided during this visit. Depending on your insurance coverage, you may receive a charge related to this service.  I need to obtain your verbal consent now. Are you willing to proceed with your visit today? Hannah Mccarthy has provided verbal consent on 10/10/2023 for a virtual visit (video or telephone). Claiborne Rigg, NP  Date: 10/10/2023 12:56 PM  Virtual Visit via Video Note   I, Claiborne Rigg, connected with  Hannah Mccarthy  ([id number], 2000/12/11) on 10/10/23 at 12:45 PM EST by a video-enabled telemedicine application and verified that I am speaking with the correct person using two identifiers.  Location: Patient: Virtual Visit Location Patient:  Store Provider: Virtual Visit Location Provider: Home Office   I discussed the limitations of evaluation and management by telemedicine and the availability of in person appointments. The patient expressed understanding and agreed to proceed.    History of Present Illness: Hannah Mccarthy is a 22 y.o. who identifies as a female who was assigned female at birth, and is being seen today for asthma and URI symptoms.    Ms Nalbone reports dry persistent cough x1 week. Body aches and chest pain with breathing over the past 3 days. Has not tested for flu or COVID. Has a history of asthma and states current SABA ineffective.    Problems:  Patient Active Problem List   Diagnosis Date Noted   Sickle cell trait (HCC) 07/09/2021   Renal disorder    Endometriosis    Asthma    High serum bone-specific alkaline phosphatase 01/16/2020   Family history of diabetes mellitus (DM) 06/07/2019   Severe episode of recurrent major depressive disorder, without psychotic features (HCC) 06/07/2019   Proteinuria 08/04/2018   Idiopathic scoliosis of thoracic spine 07/13/2018   GAD (generalized anxiety disorder) 06/02/2016    Allergies:  Allergies  Allergen Reactions   Escitalopram Itching and Nausea Only   Naproxen Itching, Other (See Comments) and Anxiety    Heaving menstrual bleeding   Medications:  Current Outpatient Medications:    brompheniramine-pseudoephedrine-DM 30-2-10 MG/5ML syrup, Take 5 mLs by mouth 4 (four) times daily as needed., Disp: 240 mL, Rfl: 0   ibuprofen (ADVIL) 800 MG tablet, Take  1 tablet (800 mg total) by mouth every 8 (eight) hours as needed., Disp: 30 tablet, Rfl: 0   predniSONE (DELTASONE) 20 MG tablet, Take 1 tablet (20 mg total) by mouth daily with breakfast for 5 days., Disp: 5 tablet, Rfl: 0   albuterol (VENTOLIN HFA) 108 (90 Base) MCG/ACT inhaler, Inhale 2 puffs into the lungs every 4 (four) hours as needed for wheezing or shortness of breath., Disp: 1 each, Rfl: 2    azithromycin (ZITHROMAX) 250 MG tablet, Take 1 tablet (250 mg total) by mouth daily. Take first 2 tablets together, then 1 every day until finished., Disp: 6 tablet, Rfl: 0   Blood Pressure Monitoring (BLOOD PRESSURE KIT) DEVI, 1 Device by Does not apply route as needed. (Patient not taking: Reported on 11/11/2021), Disp: 1 each, Rfl: 0   budesonide-formoterol (SYMBICORT) 160-4.5 MCG/ACT inhaler, Inhale 2 puffs into the lungs 2 (two) times daily., Disp: 1 each, Rfl: 3   cetirizine (ZYRTEC) 10 MG tablet, Take 1 tablet (10 mg total) by mouth daily as needed for allergies., Disp: 90 tablet, Rfl: 1   cetirizine-pseudoephedrine (ZYRTEC-D) 5-120 MG tablet, Take 1 tablet by mouth daily., Disp: 30 tablet, Rfl: 0   Ipratropium-Albuterol (COMBIVENT) 20-100 MCG/ACT AERS respimat, Inhale 1 puff into the lungs every 6 (six) hours., Disp: 4 g, Rfl: 1   magnesium oxide (MAG-OX) 400 MG tablet, Take 400 mg by mouth daily. (Patient not taking: Reported on 01/03/2022), Disp: , Rfl:    Misc. Devices (GOJJI WEIGHT SCALE) MISC, 1 Device by Does not apply route as needed. (Patient not taking: Reported on 11/11/2021), Disp: 1 each, Rfl: 0   Prenatal Vit-Fe Fumarate-FA (PRENATAL VITAMINS PO), Take 1 tablet by mouth daily. (Patient not taking: Reported on 03/21/2022), Disp: , Rfl:   Observations/Objective: Patient is well-developed, well-nourished in no acute distress.  Head is normocephalic, atraumatic.  No labored breathing.  Speech is clear and coherent with logical content.  Patient is alert and oriented at baseline.    Assessment and Plan: 1. Mild intermittent asthma without complication - predniSONE (DELTASONE) 20 MG tablet; Take 1 tablet (20 mg total) by mouth daily with breakfast for 5 days.  Dispense: 5 tablet; Refill: 0 - brompheniramine-pseudoephedrine-DM 30-2-10 MG/5ML syrup; Take 5 mLs by mouth 4 (four) times daily as needed.  Dispense: 240 mL; Refill: 0 - ibuprofen (ADVIL) 800 MG tablet; Take 1 tablet (800 mg  total) by mouth every 8 (eight) hours as needed.  Dispense: 30 tablet; Refill: 0  Asthma action plan in place  Follow Up Instructions: I discussed the assessment and treatment plan with the patient. The patient was provided an opportunity to ask questions and all were answered. The patient agreed with the plan and demonstrated an understanding of the instructions.  A copy of instructions were sent to the patient via MyChart unless otherwise noted below.    The patient was advised to call back or seek an in-person evaluation if the symptoms worsen or if the condition fails to improve as anticipated.    Claiborne Rigg, NP

## 2023-10-10 NOTE — Patient Instructions (Signed)
[name], thank you for joining Claiborne Rigg, NP for today's virtual visit.  While this provider is not your primary care provider (PCP), if your PCP is located in our provider database this encounter information will be shared with them immediately following your visit.   A [facility] MyChart account gives you access to today's visit and all your visits, tests, and labs performed at [facility] " click here if you don't have a [facility] MyChart account or go to [email or web address]  Consent: (Patient) Hannah Mccarthy provided verbal consent for this virtual visit at the beginning of the encounter.  Current Medications:  Current Outpatient Medications:    albuterol (VENTOLIN HFA) 108 (90 Base) MCG/ACT inhaler, Inhale 2 puffs into the lungs every 4 (four) hours as needed for wheezing or shortness of breath., Disp: 1 each, Rfl: 2   azithromycin (ZITHROMAX) 250 MG tablet, Take 1 tablet (250 mg total) by mouth daily. Take first 2 tablets together, then 1 every day until finished., Disp: 6 tablet, Rfl: 0   Blood Pressure Monitoring (BLOOD PRESSURE KIT) DEVI, 1 Device by Does not apply route as needed. (Patient not taking: Reported on 11/11/2021), Disp: 1 each, Rfl: 0   brompheniramine-pseudoephedrine-DM 30-2-10 MG/5ML syrup, Take 5 mLs by mouth 4 (four) times daily as needed., Disp: 240 mL, Rfl: 0   budesonide-formoterol (SYMBICORT) 160-4.5 MCG/ACT inhaler, Inhale 2 puffs into the lungs 2 (two) times daily., Disp: 1 each, Rfl: 3   cetirizine (ZYRTEC) 10 MG tablet, Take 1 tablet (10 mg total) by mouth daily as needed for allergies., Disp: 90 tablet, Rfl: 1   cetirizine-pseudoephedrine (ZYRTEC-D) 5-120 MG tablet, Take 1 tablet by mouth daily., Disp: 30 tablet, Rfl: 0   ibuprofen (ADVIL) 800 MG tablet, Take 1 tablet (800 mg total) by mouth every 8 (eight) hours as needed., Disp: 30 tablet, Rfl: 0   Ipratropium-Albuterol (COMBIVENT) 20-100 MCG/ACT AERS respimat, Inhale 1 puff into  the lungs every 6 (six) hours., Disp: 4 g, Rfl: 1   magnesium oxide (MAG-OX) 400 MG tablet, Take 400 mg by mouth daily. (Patient not taking: Reported on 01/03/2022), Disp: , Rfl:    Misc. Devices (GOJJI WEIGHT SCALE) MISC, 1 Device by Does not apply route as needed. (Patient not taking: Reported on 11/11/2021), Disp: 1 each, Rfl: 0   predniSONE (DELTASONE) 20 MG tablet, Take 1 tablet (20 mg total) by mouth daily with breakfast for 5 days., Disp: 5 tablet, Rfl: 0   Prenatal Vit-Fe Fumarate-FA (PRENATAL VITAMINS PO), Take 1 tablet by mouth daily. (Patient not taking: Reported on 03/21/2022), Disp: , Rfl:    Medications ordered in this encounter:  Meds ordered this encounter  Medications   predniSONE (DELTASONE) 20 MG tablet    Sig: Take 1 tablet (20 mg total) by mouth daily with breakfast for 5 days.    Dispense:  5 tablet    Refill:  0    Order Specific Question:   Supervising Provider    Answer:   Merrilee Jansky [id number]   brompheniramine-pseudoephedrine-DM 30-2-10 MG/5ML syrup    Sig: Take 5 mLs by mouth 4 (four) times daily as needed.    Dispense:  240 mL    Refill:  0    Order Specific Question:   Supervising Provider    Answer:   Merrilee Jansky [id number]   ibuprofen (ADVIL) 800 MG tablet    Sig: Take 1 tablet (800 mg total) by mouth every 8 (eight) hours as needed.    Dispense:  30 tablet    Refill:  0    Order Specific Question:   Supervising Provider    Answer:   Merrilee Jansky [[id number]]     *If you need refills on other medications prior to your next appointment, please contact your pharmacy*  Follow-Up: Call back or seek an in-person evaluation if the symptoms worsen or if the condition fails to improve as anticipated.  [facility] [phone]  Other Instructions Asthma action plan in place   If you have been instructed to have an in-person evaluation today at a local Urgent Care facility, please use the link below. It will take you to a list of  all of our available [facility], including address, phone number and hours of operation. Please do not delay care.  [facility]  If you or a family member do not have a primary care provider, use the link below to schedule a visit and establish care. When you choose a [facility] primary care physician or advanced practice provider, you gain a long-term partner in health. Find a Primary Care Provider  Learn more about 's in-office and virtual care options:  - Get Care Now

## 2023-12-02 DIAGNOSIS — K602 Anal fissure, unspecified: Secondary | ICD-10-CM | POA: Diagnosis not present

## 2023-12-02 DIAGNOSIS — K921 Melena: Secondary | ICD-10-CM | POA: Diagnosis not present

## 2023-12-03 DIAGNOSIS — Z3200 Encounter for pregnancy test, result unknown: Secondary | ICD-10-CM | POA: Diagnosis not present

## 2023-12-03 DIAGNOSIS — R1084 Generalized abdominal pain: Secondary | ICD-10-CM | POA: Diagnosis not present

## 2023-12-06 ENCOUNTER — Emergency Department (HOSPITAL_COMMUNITY)
Admission: EM | Admit: 2023-12-06 | Discharge: 2023-12-06 | Disposition: A | Discharge status: Home / Self Care | Payer: Medicaid Other | Attending: Student | Admitting: Student

## 2023-12-06 ENCOUNTER — Encounter (HOSPITAL_COMMUNITY): Payer: Self-pay

## 2023-12-06 ENCOUNTER — Other Ambulatory Visit: Payer: Self-pay

## 2023-12-06 DIAGNOSIS — Z20822 Contact with and (suspected) exposure to covid-19: Secondary | ICD-10-CM | POA: Diagnosis not present

## 2023-12-06 DIAGNOSIS — B349 Viral infection, unspecified: Secondary | ICD-10-CM | POA: Insufficient documentation

## 2023-12-06 DIAGNOSIS — M7918 Myalgia, other site: Secondary | ICD-10-CM | POA: Diagnosis present

## 2023-12-06 LAB — RESP PANEL BY RT-PCR (RSV, FLU A&B, COVID)  RVPGX2
Influenza A by PCR: NEGATIVE
Influenza B by PCR: NEGATIVE
Resp Syncytial Virus by PCR: NEGATIVE
SARS Coronavirus 2 by RT PCR: NEGATIVE

## 2023-12-06 MED ORDER — ONDANSETRON 4 MG PO TBDP: 4.0000 mg | ORAL_TABLET | Freq: Three times a day (TID) | ORAL | 0 refills | Status: AC | PRN

## 2023-12-06 NOTE — ED Provider Notes (Signed)
[place] EMERGENCY DEPARTMENT AT [facility] Provider Note   CSN: [id number] Arrival date & time: 12/06/23  1945     History  Chief Complaint  Patient presents with   Generalized Body Aches    Hannah Mccarthy is a 23 y.o. female.  HPI Patient is a 23 year old female presents the ED today complaining of a 3 history of bodyaches, headache, chills, low back pain radiating to both legs.  Previous medical history of endometriosis, GAD, asthma, scoliosis, renal disorder.  Patient recently seen by PCP while symptomatic and had labs drawn  For possible pregnancy.  However presents today because symptoms have not gone away. Reports chest pain worse with cough. Denies fever, shortness of breath, abdominal pain, leg swelling.    Home Medications Prior to Admission medications   Medication Sig Start Date End Date Taking? Authorizing Provider  ondansetron (ZOFRAN-ODT) 4 MG disintegrating tablet Take 1 tablet (4 mg total) by mouth every 8 (eight) hours as needed for nausea or vomiting. 12/06/23  Yes Lunette Stands, PA-C  albuterol (VENTOLIN HFA) 108 (90 Base) MCG/ACT inhaler Inhale 2 puffs into the lungs every 4 (four) hours as needed for wheezing or shortness of breath. 01/03/22   Reva Bores, MD  azithromycin (ZITHROMAX) 250 MG tablet Take 1 tablet (250 mg total) by mouth daily. Take first 2 tablets together, then 1 every day until finished. 04/22/22   Valinda Hoar, NP  Blood Pressure Monitoring (BLOOD PRESSURE KIT) DEVI 1 Device by Does not apply route as needed. Patient not taking: Reported on 11/11/2021 04/25/21   Marny Lowenstein, PA-C  brompheniramine-pseudoephedrine-DM 30-2-10 MG/5ML syrup Take 5 mLs by mouth 4 (four) times daily as needed. 10/10/23   Claiborne Rigg, NP  budesonide-formoterol [facility]) 160-4.5 MCG/ACT inhaler Inhale 2 puffs into the lungs 2 (two) times daily. 01/03/22   Reva Bores, MD  cetirizine (ZYRTEC) 10 MG tablet Take 1 tablet (10 mg total) by mouth  daily as needed for allergies. 01/03/22   Reva Bores, MD  cetirizine-pseudoephedrine (ZYRTEC-D) 5-120 MG tablet Take 1 tablet by mouth daily. 04/22/22   White, Elita Boone, NP  ibuprofen (ADVIL) 800 MG tablet Take 1 tablet (800 mg total) by mouth every 8 (eight) hours as needed. 10/10/23   Claiborne Rigg, NP  Ipratropium-Albuterol (COMBIVENT) 20-100 MCG/ACT AERS respimat Inhale 1 puff into the lungs every 6 (six) hours. 03/05/22   White, Elita Boone, NP  magnesium oxide (MAG-OX) 400 MG tablet Take 400 mg by mouth daily. Patient not taking: Reported on 01/03/2022    [provider]  Misc. Devices (GOJJI WEIGHT SCALE) MISC 1 Device by Does not apply route as needed. Patient not taking: Reported on 11/11/2021 04/25/21   Marny Lowenstein, PA-C  Prenatal Vit-Fe Fumarate-FA (PRENATAL VITAMINS PO) Take 1 tablet by mouth daily. Patient not taking: Reported on 03/21/2022    [provider]      Allergies    Escitalopram and Naproxen    Review of Systems   Review of Systems  Constitutional:  Positive for fatigue.  HENT:  Positive for congestion.   Respiratory:  Positive for cough.   Gastrointestinal:  Positive for nausea.    Physical Exam Updated Vital Signs BP 121/64 (BP Location: Right Arm)   Pulse 70   Temp 98.3 F (36.8 C) (Oral)   Resp 18   SpO2 100%  Physical Exam Vitals and nursing note reviewed.  Constitutional:      General: She is not  in acute distress.    Appearance: Normal appearance. She is not ill-appearing.  HENT:     Head: Normocephalic and atraumatic.     Mouth/Throat:     Pharynx: No oropharyngeal exudate or posterior oropharyngeal erythema.  Eyes:     Extraocular Movements: Extraocular movements intact.     Conjunctiva/sclera: Conjunctivae normal.  Cardiovascular:     Rate and Rhythm: Normal rate and regular rhythm.     Pulses: Normal pulses.     Heart sounds: Normal heart sounds. No murmur heard.    No friction rub. No gallop.  Pulmonary:      Effort: Pulmonary effort is normal. No respiratory distress.     Breath sounds: Normal breath sounds. No stridor. No wheezing, rhonchi or rales.  Abdominal:     General: Abdomen is flat.     Palpations: Abdomen is soft.     Tenderness: There is no abdominal tenderness.  Musculoskeletal:        General: No swelling, tenderness or signs of injury.     Cervical back: Normal range of motion and neck supple. No rigidity or tenderness.     Right lower leg: No edema.     Left lower leg: No edema.  Skin:    General: Skin is warm and dry.     Coloration: Skin is not jaundiced or pale.     Findings: No erythema.  Neurological:     General: No focal deficit present.     Mental Status: She is alert. Mental status is at baseline.     Motor: No weakness.  Psychiatric:        Mood and Affect: Mood normal.     ED Results / Procedures / Treatments   Labs (all labs ordered are listed, but only abnormal results are displayed) Labs Reviewed  RESP PANEL BY RT-PCR (RSV, FLU A&B, COVID)  [id number]    EKG None  Radiology No results found.  Procedures Procedures    Medications Ordered in ED Medications - No data to display  ED Course/ Medical Decision Making/ A&P                                 Medical Decision Making  This patient is a 23 year old female who presents to the ED for concern of bodyaches, headache, cough x 3 days with daughter having RSV.   Differential diagnoses prior to evaluation: The emergent differential diagnosis includes, but is not limited to, URI, pneumonia, bronchitis,. This is not an exhaustive differential.   Past Medical History / Co-morbidities / Social History: Asthma, endometriosis, renal disorder, sickle cell trait, scoliosis, high serum alkaline phosphatase  Additional history: Chart reviewed. Pertinent results include:  Seen on 12/03/2023 for possible pregnancy  Lab Tests/Imaging studies: I personally interpreted labs/imaging and the pertinent  results include:   Respiratory panel negative   Medications: No medication needed for this visit.  I have reviewed the patients home medicines and have made adjustments as needed.  Critical Interventions:  ED Course:  Patient is a 23 year old female that presents to the ED with cough, congestion, body aches, nausea, vomiting.  Patient states that her daughter was diagnosed RSV earlier this week.  Patient has since then been experiencing headache x 3 days ago.  Recently seen by PCP and had full lab works drawn due to possible pregnancy.  Lab work was entirely normal and did not show any signs of emergent pathology  Physical exam was benign.  Vital signs were stable and remained so throughout the Course of her time here.  Low suspicion for any emergent process at this point.  Due to patient's normal labs done while symptomatic 3 days ago, normal vitals, normal physical exam, believe this patient stated discharged at this point.  And followed up outpatient.  Considered doing more labs and imaging however was not indicated due to benign exam, normal vital signs, normal presentation.  Recommend that she continue Take Tylenol for pain relief and follow-up with PCP if symptoms persist.  Provided strict return to ED precautions.  Provided Zofran outpatient for patient's nausea.  She expressed agreement understanding of plan.  Disposition: After consideration of the diagnostic results and the patients response to treatment, I feel that feel the patient would benefit from discharge and treatment as above.   emergency department workup does not suggest an emergent condition requiring admission or immediate intervention beyond what has been performed at this time. The plan is: Zofran for nausea, rest, hydration. The patient is safe for discharge and has been instructed to return immediately for worsening symptoms, change in symptoms or any other concerns.  Final Clinical Impression(s) / ED  Diagnoses Final diagnoses:  Viral illness    Rx / DC Orders ED Discharge Orders          Ordered    ondansetron (ZOFRAN-ODT) 4 MG disintegrating tablet  Every 8 hours PRN        12/06/23 2255              Lunette Stands, PA-C 12/06/23 2259    Glendora Score, MD 12/07/23 1241

## 2023-12-06 NOTE — ED Triage Notes (Signed)
Pt. Arrives POV for body aches, headache and a cough x3 days. Pt.'s daughter had RSV.

## 2023-12-06 NOTE — Discharge Instructions (Addendum)
You were seen for a respiratory infection.  I have low suspicion of any emergent process present this time as he did recently have labs while symptomatic and your vital signs here were also reassuring.  Though your respiratory panel was negative, it is likely that you are most likely suffering from a upper respiratory viral infection that is not listed on the viral panel.  Your lab work done on 1//2025 was very reassuring.   Recommend they follow with a PCP for further evaluation if symptoms persist. Due to you having renal disease, avoid NSAIDs including ibuprofen.  However continue to take Tylenol.  Take Tylenol (acetominophen)  650mg  every 4-6 hours, as needed for pain or fever. Do not take more than 4,000 mg in a 24-hour period. As this may cause liver damage. While this is rare, if you begin to develop yellowing of the skin or eyes, stop taking and return to ER immediately.

## 2023-12-11 DIAGNOSIS — R102 Pelvic and perineal pain: Secondary | ICD-10-CM | POA: Diagnosis not present

## 2023-12-11 DIAGNOSIS — N920 Excessive and frequent menstruation with regular cycle: Secondary | ICD-10-CM | POA: Diagnosis not present

## 2023-12-11 DIAGNOSIS — N946 Dysmenorrhea, unspecified: Secondary | ICD-10-CM | POA: Diagnosis not present

## 2023-12-11 DIAGNOSIS — N809 Endometriosis, unspecified: Secondary | ICD-10-CM | POA: Diagnosis not present

## 2023-12-11 DIAGNOSIS — N644 Mastodynia: Secondary | ICD-10-CM | POA: Diagnosis not present

## 2023-12-11 DIAGNOSIS — Z3202 Encounter for pregnancy test, result negative: Secondary | ICD-10-CM | POA: Diagnosis not present

## 2023-12-11 DIAGNOSIS — R1010 Upper abdominal pain, unspecified: Secondary | ICD-10-CM | POA: Diagnosis not present

## 2023-12-11 DIAGNOSIS — B9689 Other specified bacterial agents as the cause of diseases classified elsewhere: Secondary | ICD-10-CM | POA: Diagnosis not present

## 2023-12-11 DIAGNOSIS — N760 Acute vaginitis: Secondary | ICD-10-CM | POA: Diagnosis not present

## 2023-12-17 ENCOUNTER — Emergency Department (HOSPITAL_COMMUNITY): Payer: Medicaid Other

## 2023-12-17 ENCOUNTER — Emergency Department (HOSPITAL_COMMUNITY)
Admission: EM | Admit: 2023-12-17 | Discharge: 2023-12-18 | Disposition: A | Discharge status: Home / Self Care | Payer: Medicaid Other | Attending: Emergency Medicine | Admitting: Emergency Medicine

## 2023-12-17 ENCOUNTER — Other Ambulatory Visit: Payer: Self-pay

## 2023-12-17 DIAGNOSIS — R1032 Left lower quadrant pain: Secondary | ICD-10-CM | POA: Diagnosis not present

## 2023-12-17 DIAGNOSIS — J45909 Unspecified asthma, uncomplicated: Secondary | ICD-10-CM | POA: Diagnosis not present

## 2023-12-17 DIAGNOSIS — Z7951 Long term (current) use of inhaled steroids: Secondary | ICD-10-CM | POA: Insufficient documentation

## 2023-12-17 DIAGNOSIS — R1031 Right lower quadrant pain: Secondary | ICD-10-CM | POA: Insufficient documentation

## 2023-12-17 DIAGNOSIS — R0789 Other chest pain: Secondary | ICD-10-CM | POA: Diagnosis not present

## 2023-12-17 DIAGNOSIS — R079 Chest pain, unspecified: Secondary | ICD-10-CM | POA: Diagnosis present

## 2023-12-17 LAB — URINALYSIS, ROUTINE W REFLEX MICROSCOPIC
Bilirubin Urine: NEGATIVE
Glucose, UA: NEGATIVE mg/dL
Hgb urine dipstick: NEGATIVE
Ketones, ur: NEGATIVE mg/dL
Nitrite: NEGATIVE
Protein, ur: NEGATIVE mg/dL
Specific Gravity, Urine: 1.012 (ref 1.005–1.030)
pH: 7 (ref 5.0–8.0)

## 2023-12-17 LAB — PREGNANCY, URINE: Preg Test, Ur: NEGATIVE

## 2023-12-17 NOTE — ED Provider Notes (Signed)
[place] EMERGENCY DEPARTMENT AT [facility] Provider Note   CSN: [id number] Arrival date & time: 12/17/23  1720     History {Add pertinent medical, surgical, social history, OB history to HPI:1} Chief Complaint  Patient presents with   Abdominal Pain    Hannah Mccarthy is a 23 y.o. female.      Home Medications Prior to Admission medications   Medication Sig Start Date End Date Taking? Authorizing Provider  albuterol (VENTOLIN HFA) 108 (90 Base) MCG/ACT inhaler Inhale 2 puffs into the lungs every 4 (four) hours as needed for wheezing or shortness of breath. 01/03/22   Reva Bores, MD  azithromycin (ZITHROMAX) 250 MG tablet Take 1 tablet (250 mg total) by mouth daily. Take first 2 tablets together, then 1 every day until finished. 04/22/22   Valinda Hoar, NP  Blood Pressure Monitoring (BLOOD PRESSURE KIT) DEVI 1 Device by Does not apply route as needed. Patient not taking: Reported on 11/11/2021 04/25/21   Marny Lowenstein, PA-C  brompheniramine-pseudoephedrine-DM 30-2-10 MG/5ML syrup Take 5 mLs by mouth 4 (four) times daily as needed. 10/10/23   Claiborne Rigg, NP  budesonide-formoterol [facility]) 160-4.5 MCG/ACT inhaler Inhale 2 puffs into the lungs 2 (two) times daily. 01/03/22   Reva Bores, MD  cetirizine (ZYRTEC) 10 MG tablet Take 1 tablet (10 mg total) by mouth daily as needed for allergies. 01/03/22   Reva Bores, MD  cetirizine-pseudoephedrine (ZYRTEC-D) 5-120 MG tablet Take 1 tablet by mouth daily. 04/22/22   White, Elita Boone, NP  ibuprofen (ADVIL) 800 MG tablet Take 1 tablet (800 mg total) by mouth every 8 (eight) hours as needed. 10/10/23   Claiborne Rigg, NP  Ipratropium-Albuterol (COMBIVENT) 20-100 MCG/ACT AERS respimat Inhale 1 puff into the lungs every 6 (six) hours. 03/05/22   White, Elita Boone, NP  magnesium oxide (MAG-OX) 400 MG tablet Take 400 mg by mouth daily. Patient not taking: Reported on 01/03/2022    [provider]  Misc. Devices  (GOJJI WEIGHT SCALE) MISC 1 Device by Does not apply route as needed. Patient not taking: Reported on 11/11/2021 04/25/21   Marny Lowenstein, PA-C  ondansetron (ZOFRAN-ODT) 4 MG disintegrating tablet Take 1 tablet (4 mg total) by mouth every 8 (eight) hours as needed for nausea or vomiting. 12/06/23   Lunette Stands, PA-C  Prenatal Vit-Fe Fumarate-FA (PRENATAL VITAMINS PO) Take 1 tablet by mouth daily. Patient not taking: Reported on 03/21/2022    [provider]      Allergies    Escitalopram and Naproxen    Review of Systems   Review of Systems  Gastrointestinal:  Positive for abdominal pain.    Physical Exam Updated Vital Signs BP 113/72 (BP Location: Left Arm)   Pulse 70   Temp 98.9 F (37.2 C) (Oral)   Resp 16   Ht 5\' 5"  (1.651 m)   Wt 60.8 kg   LMP 11/25/2023   SpO2 100%   BMI 22.30 kg/m  Physical Exam  ED Results / Procedures / Treatments   Labs (all labs ordered are listed, but only abnormal results are displayed) Labs Reviewed  URINALYSIS, ROUTINE W REFLEX MICROSCOPIC - Abnormal; Notable for the following components:      Result Value   APPearance HAZY (*)    Leukocytes,Ua TRACE (*)    Bacteria, UA RARE (*)    All other components within normal limits  RESP PANEL BY RT-PCR (RSV, FLU A&B, COVID)  RVPGX2  PREGNANCY,  URINE    EKG None  Radiology DG Chest 2 View Result Date: 12/17/2023 CLINICAL DATA:  Cough. EXAM: CHEST - 2 VIEW COMPARISON:  Chest radiograph dated 12/08/2019. FINDINGS: The heart size and mediastinal contours are within normal limits. Both lungs are clear. The visualized skeletal structures are unremarkable. IMPRESSION: No active cardiopulmonary disease. Electronically Signed   By: Elgie Collard M.D.   On: 12/17/2023 19:48    Procedures Procedures  {Document cardiac monitor, telemetry assessment procedure when appropriate:1}  Medications Ordered in ED Medications - No data to display  ED Course/ Medical Decision Making/ A&P   {    Click here for ABCD2, HEART and other calculatorsREFRESH Note before signing :1}                              Medical Decision Making  ***  {Document critical care time when appropriate:1} {Document review of labs and clinical decision tools ie heart score, Chads2Vasc2 etc:1}  {Document your independent review of radiology images, and any outside records:1} {Document your discussion with family members, caretakers, and with consultants:1} {Document social determinants of health affecting pt's care:1} {Document your decision making why or why not admission, treatments were needed:1} Final Clinical Impression(s) / ED Diagnoses Final diagnoses:  None    Rx / DC Orders ED Discharge Orders     None

## 2023-12-17 NOTE — ED Provider Triage Note (Signed)
Emergency Medicine Provider Triage Evaluation Note   Hannah Mccarthy , a 23 y.o. female  was evaluated in triage.  Pt complains of low abd pain, chest pain/cough.  Review of Systems  Positive:  Negative: fever  Physical Exam  BP 126/84 (BP Location: Left Arm)   Pulse 68   Temp 98.8 F (37.1 C) (Oral)   Resp 16   Ht 5\' 5"  (1.651 m)   Wt 60.8 kg   SpO2 100%   BMI 22.30 kg/m  Gen:   Awake, no distress   Resp:  Normal effort  MSK:   Moves extremities without difficulty  Other:    Medical Decision Making  Medically screening exam initiated at 7:27 PM.  Appropriate orders placed.  Wadie Mattie was informed that the remainder of the evaluation will be completed by another provider, this initial triage assessment does not replace that evaluation, and the importance of remaining in the ED until their evaluation is complete.  Low abdominal pain for one week with vomiting. Seen in ED one week ago for same.   CP and cough x 2 days. No fever   Elpidio Anis, PA-C 12/17/23 1928

## 2023-12-17 NOTE — ED Triage Notes (Signed)
Pt states she is having lower abdominal pain x 1 week with chest pain x 2 days with cough. Has ultrasound scheduled for end of March.

## 2023-12-18 ENCOUNTER — Emergency Department (HOSPITAL_COMMUNITY): Payer: Medicaid Other

## 2023-12-18 LAB — TROPONIN I (HIGH SENSITIVITY): Troponin I (High Sensitivity): <2 ng/L (ref <18)

## 2023-12-18 LAB — COMPREHENSIVE METABOLIC PANEL
ALT: 11 U/L (ref 0–44)
AST: 14 U/L — ABNORMAL LOW (ref 15–41)
Albumin: 3.8 g/dL (ref 3.5–5.0)
Alkaline Phosphatase: 80 U/L (ref 38–126)
Anion gap: 10 (ref 5–15)
BUN: 8 mg/dL (ref 6–20)
CO2: 24 mmol/L (ref 22–32)
Calcium: 9 mg/dL (ref 8.9–10.3)
Chloride: 103 mmol/L (ref 98–111)
Creatinine, Ser: 0.54 mg/dL (ref 0.44–1.00)
GFR, Estimated: >60 mL/min (ref >60)
Glucose, Bld: 95 mg/dL (ref 70–99)
Potassium: 4 mmol/L (ref 3.5–5.1)
Sodium: 137 mmol/L (ref 135–145)
Total Bilirubin: 0.4 mg/dL (ref 0.0–1.2)
Total Protein: 7.5 g/dL (ref 6.5–8.1)

## 2023-12-18 LAB — RESP PANEL BY RT-PCR (RSV, FLU A&B, COVID)  RVPGX2
Influenza A by PCR: NEGATIVE
Influenza B by PCR: NEGATIVE
Resp Syncytial Virus by PCR: NEGATIVE
SARS Coronavirus 2 by RT PCR: NEGATIVE

## 2023-12-18 LAB — LIPASE, BLOOD: Lipase: 34 U/L (ref 11–51)

## 2023-12-18 LAB — CBC WITH DIFFERENTIAL/PLATELET
Abs Immature Granulocytes: 0.02 10*3/uL (ref 0.00–0.07)
Basophils Absolute: 0 10*3/uL (ref 0.0–0.1)
Basophils Relative: 0 %
Eosinophils Absolute: 0 10*3/uL (ref 0.0–0.5)
Eosinophils Relative: 0 %
HCT: 38.1 % (ref 36.0–46.0)
Hemoglobin: 12.2 g/dL (ref 12.0–15.0)
Immature Granulocytes: 0 %
Lymphocytes Relative: 34 %
Lymphs Abs: 2.5 10*3/uL (ref 0.7–4.0)
MCH: 24.9 pg — ABNORMAL LOW (ref 26.0–34.0)
MCHC: 32 g/dL (ref 30.0–36.0)
MCV: 77.9 fL — ABNORMAL LOW (ref 80.0–100.0)
Monocytes Absolute: 0.7 10*3/uL (ref 0.1–1.0)
Monocytes Relative: 10 %
Neutro Abs: 4.1 10*3/uL (ref 1.7–7.7)
Neutrophils Relative %: 56 %
Platelets: 329 10*3/uL (ref 150–400)
RBC: 4.89 MIL/uL (ref 3.87–5.11)
RDW: 14.8 % (ref 11.5–15.5)
WBC: 7.3 10*3/uL (ref 4.0–10.5)
nRBC: 0 % (ref 0.0–0.2)

## 2023-12-18 MED ORDER — KETOROLAC TROMETHAMINE 15 MG/ML IJ SOLN
15.0000 mg | Freq: Once | INTRAMUSCULAR | Status: AC
  Administered 2023-12-18: 15 mg via INTRAVENOUS
  Filled 2023-12-18: qty 1

## 2023-12-18 NOTE — Discharge Instructions (Addendum)
Your chest pain is likely secondary to something called costochondritis, likely from your upper respiratory infection, last week.  Please take Tylenol, ibuprofen for your symptoms.  Return if you have severe shortness of breath, worsening chest pain.  Additionally your abdominal pain I believe is likely secondary to your endometriosis.  Your ultrasound was fairly unremarkable.  Please follow-up with your OB/GYN, and primary care doctor for further evaluation

## 2023-12-31 DIAGNOSIS — N809 Endometriosis, unspecified: Secondary | ICD-10-CM | POA: Diagnosis not present

## 2024-01-01 DIAGNOSIS — N809 Endometriosis, unspecified: Secondary | ICD-10-CM | POA: Diagnosis not present

## 2024-01-15 ENCOUNTER — Other Ambulatory Visit: Payer: Self-pay

## 2024-01-15 ENCOUNTER — Emergency Department (HOSPITAL_COMMUNITY)

## 2024-01-15 ENCOUNTER — Emergency Department (HOSPITAL_COMMUNITY)
Admission: EM | Admit: 2024-01-15 | Discharge: 2024-01-15 | Discharge status: Against Medical Advice | Attending: Emergency Medicine | Admitting: Emergency Medicine

## 2024-01-15 DIAGNOSIS — R1084 Generalized abdominal pain: Secondary | ICD-10-CM | POA: Insufficient documentation

## 2024-01-15 DIAGNOSIS — R112 Nausea with vomiting, unspecified: Secondary | ICD-10-CM | POA: Diagnosis not present

## 2024-01-15 DIAGNOSIS — R197 Diarrhea, unspecified: Secondary | ICD-10-CM | POA: Insufficient documentation

## 2024-01-15 DIAGNOSIS — Z7951 Long term (current) use of inhaled steroids: Secondary | ICD-10-CM | POA: Insufficient documentation

## 2024-01-15 DIAGNOSIS — R109 Unspecified abdominal pain: Secondary | ICD-10-CM

## 2024-01-15 DIAGNOSIS — J45909 Unspecified asthma, uncomplicated: Secondary | ICD-10-CM | POA: Diagnosis not present

## 2024-01-15 DIAGNOSIS — R1030 Lower abdominal pain, unspecified: Secondary | ICD-10-CM | POA: Diagnosis not present

## 2024-01-15 LAB — CBC
HCT: 40.7 % (ref 36.0–46.0)
Hemoglobin: 12.9 g/dL (ref 12.0–15.0)
MCH: 24.5 pg — ABNORMAL LOW (ref 26.0–34.0)
MCHC: 31.7 g/dL (ref 30.0–36.0)
MCV: 77.4 fL — ABNORMAL LOW (ref 80.0–100.0)
Platelets: 350 10*3/uL (ref 150–400)
RBC: 5.26 MIL/uL — ABNORMAL HIGH (ref 3.87–5.11)
RDW: 14.8 % (ref 11.5–15.5)
WBC: 5.7 10*3/uL (ref 4.0–10.5)
nRBC: 0 % (ref 0.0–0.2)

## 2024-01-15 LAB — COMPREHENSIVE METABOLIC PANEL
ALT: 12 U/L (ref 0–44)
AST: 14 U/L — ABNORMAL LOW (ref 15–41)
Albumin: 3.7 g/dL (ref 3.5–5.0)
Alkaline Phosphatase: 81 U/L (ref 38–126)
Anion gap: 8 (ref 5–15)
BUN: 7 mg/dL (ref 6–20)
CO2: 23 mmol/L (ref 22–32)
Calcium: 8.5 mg/dL — ABNORMAL LOW (ref 8.9–10.3)
Chloride: 106 mmol/L (ref 98–111)
Creatinine, Ser: 0.79 mg/dL (ref 0.44–1.00)
GFR, Estimated: >60 mL/min (ref >60)
Glucose, Bld: 98 mg/dL (ref 70–99)
Potassium: 3.6 mmol/L (ref 3.5–5.1)
Sodium: 137 mmol/L (ref 135–145)
Total Bilirubin: 0.5 mg/dL (ref 0.0–1.2)
Total Protein: 7.8 g/dL (ref 6.5–8.1)

## 2024-01-15 LAB — URINALYSIS, ROUTINE W REFLEX MICROSCOPIC
Bilirubin Urine: NEGATIVE
Glucose, UA: NEGATIVE mg/dL
Ketones, ur: NEGATIVE mg/dL
Nitrite: NEGATIVE
Protein, ur: 30 mg/dL — AB
RBC / HPF: >50 RBC/hpf (ref 0–5)
Specific Gravity, Urine: 1.01 (ref 1.005–1.030)
WBC, UA: >50 WBC/hpf (ref 0–5)
pH: 6 (ref 5.0–8.0)

## 2024-01-15 LAB — HCG, SERUM, QUALITATIVE: Preg, Serum: NEGATIVE

## 2024-01-15 LAB — LIPASE, BLOOD: Lipase: 35 U/L (ref 11–51)

## 2024-01-15 MED ORDER — IOHEXOL 300 MG/ML  SOLN
100.0000 mL | Freq: Once | INTRAMUSCULAR | Status: AC | PRN
  Administered 2024-01-15: 100 mL via INTRAVENOUS

## 2024-01-15 MED ORDER — LACTATED RINGERS IV BOLUS
1000.0000 mL | Freq: Once | INTRAVENOUS | Status: AC
  Administered 2024-01-15: 1000 mL via INTRAVENOUS

## 2024-01-15 MED ORDER — DROPERIDOL 2.5 MG/ML IJ SOLN
2.5000 mg | Freq: Once | INTRAMUSCULAR | Status: AC
  Administered 2024-01-15: 2.5 mg via INTRAVENOUS
  Filled 2024-01-15: qty 2

## 2024-01-15 NOTE — ED Triage Notes (Signed)
 Pt arrived via POV. C/o abd pain for 3x days and N/V since yesterday.  1 episode of diarrhea this AM

## 2024-01-15 NOTE — ED Provider Notes (Signed)
 [name] EMERGENCY DEPARTMENT AT [facility] Provider Note  CSN: [id number] Arrival date & time: 01/15/24 4098  Chief Complaint(s) Abdominal Pain and Emesis  HPI Hannah Mccarthy is a 23 y.o. female here today for abdominal pain, nausea vomiting, and diarrhea.  Symptoms have been ongoing for about 2 weeks, but suddenly got worse yesterday.  Patient endorsing pain in her lower abdomen.  Patient states that her last menstrual period was 4 weeks ago.  Patient says pressure to her abdomen makes her feel worse.  She denies any EtOH or marijuana use.  Patient has a history of endometriosis, tells me she supposed to have surgery for that on Wednesday.   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 Authorizing Provider  albuterol (VENTOLIN HFA) 108 (90 Base) MCG/ACT inhaler Inhale 2 puffs into the lungs every 4 (four) hours as needed for wheezing or shortness of breath. 01/03/22   Reva Bores, MD  azithromycin (ZITHROMAX) 250 MG tablet Take 1 tablet (250 mg total) by mouth daily. Take first 2 tablets together, then 1 every day until finished. 04/22/22   Valinda Hoar, NP  Blood Pressure  Monitoring (BLOOD PRESSURE KIT) DEVI 1 Device by Does not apply route as needed. Patient not taking: Reported on 11/11/2021 04/25/21   Marny Lowenstein, PA-C  brompheniramine-pseudoephedrine-DM 30-2-10 MG/5ML syrup Take 5 mLs by mouth 4 (four) times daily as needed. 10/10/23   Claiborne Rigg, NP  budesonide-formoterol [facility]) 160-4.5 MCG/ACT inhaler Inhale 2 puffs into the lungs 2 (two) times daily. 01/03/22   Reva Bores, MD  cetirizine (ZYRTEC) 10 MG tablet Take 1 tablet (10 mg total) by mouth daily as needed for allergies. 01/03/22   Reva Bores, MD  cetirizine-pseudoephedrine (ZYRTEC-D) 5-120 MG tablet Take 1 tablet by mouth daily. 04/22/22   White, Elita Boone, NP  ibuprofen (ADVIL) 800 MG tablet Take 1 tablet (800 mg total) by mouth every 8 (eight) hours as needed. 10/10/23   Claiborne Rigg, NP  Ipratropium-Albuterol (COMBIVENT) 20-100 MCG/ACT AERS respimat Inhale 1 puff into the lungs every 6 (six) hours. 03/05/22   White, Elita Boone, NP  magnesium oxide (MAG-OX) 400 MG tablet Take 400 mg by mouth daily. Patient not taking: Reported on 01/03/2022    [provider]  Misc. Devices (GOJJI WEIGHT SCALE) MISC 1 Device by Does not apply route as needed. Patient not taking: Reported on 11/11/2021 04/25/21   Marny Lowenstein, PA-C  ondansetron (ZOFRAN-ODT) 4 MG disintegrating tablet Take 1 tablet (4 mg total) by mouth every 8 (eight) hours as needed for nausea or vomiting. 12/06/23   Lunette Stands, PA-C  Prenatal Vit-Fe Fumarate-FA (  PRENATAL VITAMINS PO) Take 1 tablet by mouth daily. Patient not taking: Reported on 03/21/2022    [provider]                                                                                                                                    Past Surgical History Past Surgical History:  Procedure Laterality Date   ABDOMINAL SURGERY     dx for endometriosis   MOUTH SURGERY     Family History Family History  Problem Relation Age of Onset    Hypertension Mother    Heart disease Mother    Asthma Father    Diabetes Father    Diabetes Brother     Social History Social History   Tobacco Use   Smoking status: Never    Passive exposure: Yes   Smokeless tobacco: Never  Vaping Use   Vaping status: Former   Quit date: 03/05/2021   Substances: THC  Substance Use Topics   Alcohol use: No   Drug use: Never   Allergies Escitalopram and Naproxen  Review of Systems Review of Systems  Physical Exam Vital Signs  I have reviewed the triage vital signs BP 121/79 (BP Location: Right Arm)   Pulse 99   Temp 98.2 F (36.8 C) (Oral)   Resp 18   Ht 5\' 5"  (1.651 m)   Wt 60.8 kg   LMP 11/25/2023   SpO2 100%   BMI 22.30 kg/m   Physical Exam Vitals reviewed.  Eyes:     Extraocular Movements: Extraocular movements intact.  Pulmonary:     Effort: Pulmonary effort is normal.  Abdominal:     General: Abdomen is flat. There is no distension.     Palpations: Abdomen is soft. There is no shifting dullness, hepatomegaly or mass.     Tenderness: There is generalized abdominal tenderness.     Hernia: No hernia is present.  Neurological:     Mental Status: She is alert.     ED Results and Treatments Labs (all labs ordered are listed, but only abnormal results are displayed) Labs Reviewed  LIPASE, BLOOD  COMPREHENSIVE METABOLIC PANEL  CBC  URINALYSIS, ROUTINE W REFLEX MICROSCOPIC  HCG, SERUM, QUALITATIVE                                                                                                                          Radiology  No results found.  Pertinent labs & imaging results that were available during my care of the patient were reviewed by me and considered in my medical decision making (see MDM for details).  Medications Ordered in ED Medications  droperidol (INAPSINE) 2.5 MG/ML injection 2.5 mg (has no administration in time range)  lactated ringers bolus 1,000 mL (has no administration in time range)                                                                                                                                      Procedures Procedures  (including critical care time)  Medical Decision Making / ED Course   This patient presents to the ED for concern of abdominal pain, this involves an extensive number of treatment options, and is a complaint that carries with it a high risk of complications and morbidity.  The differential diagnosis includes enteritis, gastritis, cholecystitis, less likely obstruction, endometriosis, considered torsion, ectopic pregnancy.  MDM: Patient uncomfortable appearing, has generalized abdominal pain.  There is no localization to this pain.  Have ordered some droperidol for analgesia and antiemesis.  Will obtain imaging of patient's abdomen.  Blood work ordered, hCG ordered.  Reassessment 1 PM -unfortunately, patient left prior to the workup being completed.  I was in the room with another patient, when patient told nursing staff that she no longer wished to remain in the emergency room.  I was unable to discuss this further with the patient, as she had left the ER by the time I was informed.   Additional history obtained: -Additional history obtained from  -External records from outside source obtained and reviewed including: Chart review including previous notes, labs, imaging, consultation notes   Lab Tests: -I ordered, reviewed, and interpreted labs.   The pertinent results include:   Labs Reviewed  LIPASE, BLOOD  COMPREHENSIVE METABOLIC PANEL  CBC  URINALYSIS, ROUTINE W REFLEX MICROSCOPIC  HCG, SERUM, QUALITATIVE      EKG   EKG Interpretation Date/Time:    Ventricular Rate:    PR Interval:    QRS Duration:    QT Interval:    QTC Calculation:   R Axis:      Text Interpretation:           Imaging Studies ordered: I ordered imaging studies including CT imaging of the abdomen pelvis I independently visualized and interpreted  imaging. I agree with the radiologist interpretation   Medicines ordered and prescription drug management: Meds ordered this encounter  Medications   droperidol (INAPSINE) 2.5 MG/ML injection 2.5 mg   lactated ringers bolus 1,000 mL    -I have reviewed the patients home medicines and have made adjustments as needed  Past Medical History:  Diagnosis Date   Anxiety    Asthma    Back pain affecting pregnancy in third trimester 10/24/2021   Depression    Endometriosis    Moderate persistent asthma with  acute exacerbation 02/03/2016   Renal disorder    Stage 1-      Dispostion: AMA     Final Clinical Impression(s) / ED Diagnoses Final diagnoses:  None     @PCDICTATION @    Anders Simmonds T, DO 01/16/24 [phone]

## 2024-01-19 DIAGNOSIS — Z01818 Encounter for other preprocedural examination: Secondary | ICD-10-CM | POA: Diagnosis not present

## 2024-01-19 DIAGNOSIS — N809 Endometriosis, unspecified: Secondary | ICD-10-CM | POA: Diagnosis not present

## 2024-01-20 DIAGNOSIS — N80329 Endometriosis of the posterior cul-de-sac, unspecified depth: Secondary | ICD-10-CM | POA: Diagnosis not present

## 2024-01-20 DIAGNOSIS — N809 Endometriosis, unspecified: Secondary | ICD-10-CM | POA: Diagnosis not present

## 2024-01-20 DIAGNOSIS — N80319 Endometriosis of the anterior cul-de-sac, unspecified depth: Secondary | ICD-10-CM | POA: Diagnosis not present

## 2024-01-20 DIAGNOSIS — N803C3 Endometriosis of bilateral uterosacral ligament(s), unspecified depth: Secondary | ICD-10-CM | POA: Diagnosis not present

## 2024-01-21 ENCOUNTER — Emergency Department (HOSPITAL_COMMUNITY)
Admission: EM | Admit: 2024-01-21 | Discharge: 2024-01-21 | Discharge status: Against Medical Advice | Attending: Emergency Medicine | Admitting: Emergency Medicine

## 2024-01-21 ENCOUNTER — Other Ambulatory Visit: Payer: Self-pay

## 2024-01-21 ENCOUNTER — Encounter (HOSPITAL_COMMUNITY): Payer: Self-pay | Admitting: Emergency Medicine

## 2024-01-21 DIAGNOSIS — R079 Chest pain, unspecified: Secondary | ICD-10-CM | POA: Diagnosis present

## 2024-01-21 DIAGNOSIS — Z5321 Procedure and treatment not carried out due to patient leaving prior to being seen by health care provider: Secondary | ICD-10-CM | POA: Diagnosis not present

## 2024-01-21 DIAGNOSIS — G8918 Other acute postprocedural pain: Secondary | ICD-10-CM | POA: Insufficient documentation

## 2024-01-21 LAB — COMPREHENSIVE METABOLIC PANEL
ALT: 10 U/L (ref 0–44)
AST: 15 U/L (ref 15–41)
Albumin: 3.7 g/dL (ref 3.5–5.0)
Alkaline Phosphatase: 72 U/L (ref 38–126)
Anion gap: 9 (ref 5–15)
BUN: 9 mg/dL (ref 6–20)
CO2: 25 mmol/L (ref 22–32)
Calcium: 8.5 mg/dL — ABNORMAL LOW (ref 8.9–10.3)
Chloride: 103 mmol/L (ref 98–111)
Creatinine, Ser: 0.79 mg/dL (ref 0.44–1.00)
GFR, Estimated: >60 mL/min (ref >60)
Glucose, Bld: 88 mg/dL (ref 70–99)
Potassium: 3.9 mmol/L (ref 3.5–5.1)
Sodium: 137 mmol/L (ref 135–145)
Total Bilirubin: 0.5 mg/dL (ref 0.0–1.2)
Total Protein: 7.6 g/dL (ref 6.5–8.1)

## 2024-01-21 LAB — CBC
HCT: 37.6 % (ref 36.0–46.0)
Hemoglobin: 12.1 g/dL (ref 12.0–15.0)
MCH: 25.2 pg — ABNORMAL LOW (ref 26.0–34.0)
MCHC: 32.2 g/dL (ref 30.0–36.0)
MCV: 78.2 fL — ABNORMAL LOW (ref 80.0–100.0)
Platelets: 355 10*3/uL (ref 150–400)
RBC: 4.81 MIL/uL (ref 3.87–5.11)
RDW: 14.6 % (ref 11.5–15.5)
WBC: 9.1 10*3/uL (ref 4.0–10.5)
nRBC: 0 % (ref 0.0–0.2)

## 2024-01-21 NOTE — ED Triage Notes (Signed)
 Patient c/o chest pain x1 day. Patient report chest pain after she got discharge from her surgery yesterday. Patient report increase pain on her Post surgical incision too. Patient report taking PRN medication without Relief. Patient denies N/V. Patient denies fever.  Hx of robotic resection/ablation of endometriosis and chromotubation.

## 2024-01-21 NOTE — ED Notes (Signed)
 Pt states that they are leaving and going to [facility]

## 2024-02-29 DIAGNOSIS — Z3169 Encounter for other general counseling and advice on procreation: Secondary | ICD-10-CM | POA: Diagnosis not present

## 2024-02-29 DIAGNOSIS — Z4889 Encounter for other specified surgical aftercare: Secondary | ICD-10-CM | POA: Diagnosis not present

## 2024-02-29 DIAGNOSIS — N809 Endometriosis, unspecified: Secondary | ICD-10-CM | POA: Diagnosis not present

## 2024-03-03 DIAGNOSIS — N941 Unspecified dyspareunia: Secondary | ICD-10-CM | POA: Diagnosis not present

## 2024-03-03 DIAGNOSIS — R102 Pelvic and perineal pain: Secondary | ICD-10-CM | POA: Diagnosis not present

## 2024-03-03 DIAGNOSIS — M6281 Muscle weakness (generalized): Secondary | ICD-10-CM | POA: Diagnosis not present

## 2024-03-03 DIAGNOSIS — M62838 Other muscle spasm: Secondary | ICD-10-CM | POA: Diagnosis not present

## 2024-03-07 DIAGNOSIS — N942 Vaginismus: Secondary | ICD-10-CM | POA: Diagnosis not present

## 2024-03-07 DIAGNOSIS — M6281 Muscle weakness (generalized): Secondary | ICD-10-CM | POA: Diagnosis not present

## 2024-03-07 DIAGNOSIS — N941 Unspecified dyspareunia: Secondary | ICD-10-CM | POA: Diagnosis not present

## 2024-03-07 DIAGNOSIS — N94819 Vulvodynia, unspecified: Secondary | ICD-10-CM | POA: Diagnosis not present

## 2024-03-07 DIAGNOSIS — R102 Pelvic and perineal pain: Secondary | ICD-10-CM | POA: Diagnosis not present

## 2024-03-07 DIAGNOSIS — M62838 Other muscle spasm: Secondary | ICD-10-CM | POA: Diagnosis not present

## 2024-03-14 DIAGNOSIS — M25562 Pain in left knee: Secondary | ICD-10-CM | POA: Diagnosis not present

## 2024-03-14 DIAGNOSIS — M25561 Pain in right knee: Secondary | ICD-10-CM | POA: Diagnosis not present

## 2024-03-17 DIAGNOSIS — N942 Vaginismus: Secondary | ICD-10-CM | POA: Diagnosis not present

## 2024-03-17 DIAGNOSIS — N941 Unspecified dyspareunia: Secondary | ICD-10-CM | POA: Diagnosis not present

## 2024-03-17 DIAGNOSIS — N94819 Vulvodynia, unspecified: Secondary | ICD-10-CM | POA: Diagnosis not present

## 2024-03-17 DIAGNOSIS — R102 Pelvic and perineal pain: Secondary | ICD-10-CM | POA: Diagnosis not present

## 2024-03-17 DIAGNOSIS — M62838 Other muscle spasm: Secondary | ICD-10-CM | POA: Diagnosis not present

## 2024-03-17 DIAGNOSIS — M6281 Muscle weakness (generalized): Secondary | ICD-10-CM | POA: Diagnosis not present

## 2024-03-21 DIAGNOSIS — D573 Sickle-cell trait: Secondary | ICD-10-CM | POA: Diagnosis not present

## 2024-03-21 DIAGNOSIS — J4541 Moderate persistent asthma with (acute) exacerbation: Secondary | ICD-10-CM | POA: Diagnosis not present

## 2024-03-21 DIAGNOSIS — F332 Major depressive disorder, recurrent severe without psychotic features: Secondary | ICD-10-CM | POA: Diagnosis not present

## 2024-03-24 DIAGNOSIS — N942 Vaginismus: Secondary | ICD-10-CM | POA: Diagnosis not present

## 2024-03-24 DIAGNOSIS — M6281 Muscle weakness (generalized): Secondary | ICD-10-CM | POA: Diagnosis not present

## 2024-03-24 DIAGNOSIS — N94819 Vulvodynia, unspecified: Secondary | ICD-10-CM | POA: Diagnosis not present

## 2024-03-24 DIAGNOSIS — M62838 Other muscle spasm: Secondary | ICD-10-CM | POA: Diagnosis not present

## 2024-03-24 DIAGNOSIS — N941 Unspecified dyspareunia: Secondary | ICD-10-CM | POA: Diagnosis not present

## 2024-03-24 DIAGNOSIS — R102 Pelvic and perineal pain: Secondary | ICD-10-CM | POA: Diagnosis not present

## 2024-03-31 DIAGNOSIS — M6281 Muscle weakness (generalized): Secondary | ICD-10-CM | POA: Diagnosis not present

## 2024-03-31 DIAGNOSIS — M62838 Other muscle spasm: Secondary | ICD-10-CM | POA: Diagnosis not present

## 2024-03-31 DIAGNOSIS — N942 Vaginismus: Secondary | ICD-10-CM | POA: Diagnosis not present

## 2024-03-31 DIAGNOSIS — M23322 Other meniscus derangements, posterior horn of medial meniscus, left knee: Secondary | ICD-10-CM | POA: Diagnosis not present

## 2024-03-31 DIAGNOSIS — R102 Pelvic and perineal pain: Secondary | ICD-10-CM | POA: Diagnosis not present

## 2024-03-31 DIAGNOSIS — N941 Unspecified dyspareunia: Secondary | ICD-10-CM | POA: Diagnosis not present

## 2024-03-31 DIAGNOSIS — N94819 Vulvodynia, unspecified: Secondary | ICD-10-CM | POA: Diagnosis not present

## 2024-04-11 DIAGNOSIS — M23322 Other meniscus derangements, posterior horn of medial meniscus, left knee: Secondary | ICD-10-CM | POA: Diagnosis not present

## 2024-04-25 DIAGNOSIS — L304 Erythema intertrigo: Secondary | ICD-10-CM | POA: Diagnosis not present

## 2024-05-25 DIAGNOSIS — N979 Female infertility, unspecified: Secondary | ICD-10-CM | POA: Diagnosis not present

## 2024-06-13 DIAGNOSIS — Z789 Other specified health status: Secondary | ICD-10-CM | POA: Diagnosis not present

## 2024-06-13 DIAGNOSIS — N979 Female infertility, unspecified: Secondary | ICD-10-CM | POA: Diagnosis not present

## 2024-07-11 DIAGNOSIS — N809 Endometriosis, unspecified: Secondary | ICD-10-CM | POA: Diagnosis not present

## 2024-07-11 DIAGNOSIS — N979 Female infertility, unspecified: Secondary | ICD-10-CM | POA: Diagnosis not present

## 2024-07-11 DIAGNOSIS — Z789 Other specified health status: Secondary | ICD-10-CM | POA: Diagnosis not present

## 2024-07-21 DIAGNOSIS — F329 Major depressive disorder, single episode, unspecified: Secondary | ICD-10-CM | POA: Diagnosis not present

## 2024-08-04 DIAGNOSIS — F329 Major depressive disorder, single episode, unspecified: Secondary | ICD-10-CM | POA: Diagnosis not present

## 2024-08-08 DIAGNOSIS — F329 Major depressive disorder, single episode, unspecified: Secondary | ICD-10-CM | POA: Diagnosis not present

## 2024-08-15 DIAGNOSIS — F329 Major depressive disorder, single episode, unspecified: Secondary | ICD-10-CM | POA: Diagnosis not present

## 2024-08-22 DIAGNOSIS — F329 Major depressive disorder, single episode, unspecified: Secondary | ICD-10-CM | POA: Diagnosis not present

## 2024-08-25 DIAGNOSIS — F329 Major depressive disorder, single episode, unspecified: Secondary | ICD-10-CM | POA: Diagnosis not present

## 2024-08-27 DIAGNOSIS — F329 Major depressive disorder, single episode, unspecified: Secondary | ICD-10-CM | POA: Diagnosis not present

## 2024-08-29 DIAGNOSIS — F329 Major depressive disorder, single episode, unspecified: Secondary | ICD-10-CM | POA: Diagnosis not present

## 2024-08-30 DIAGNOSIS — F329 Major depressive disorder, single episode, unspecified: Secondary | ICD-10-CM | POA: Diagnosis not present

## 2024-08-31 DIAGNOSIS — F329 Major depressive disorder, single episode, unspecified: Secondary | ICD-10-CM | POA: Diagnosis not present

## 2024-09-01 DIAGNOSIS — F329 Major depressive disorder, single episode, unspecified: Secondary | ICD-10-CM | POA: Diagnosis not present

## 2024-09-02 DIAGNOSIS — F329 Major depressive disorder, single episode, unspecified: Secondary | ICD-10-CM | POA: Diagnosis not present

## 2024-09-08 DIAGNOSIS — F32A Depression, unspecified: Secondary | ICD-10-CM | POA: Diagnosis not present

## 2024-09-08 DIAGNOSIS — F329 Major depressive disorder, single episode, unspecified: Secondary | ICD-10-CM | POA: Diagnosis not present

## 2024-09-15 DIAGNOSIS — F329 Major depressive disorder, single episode, unspecified: Secondary | ICD-10-CM | POA: Diagnosis not present

## 2024-09-19 DIAGNOSIS — F329 Major depressive disorder, single episode, unspecified: Secondary | ICD-10-CM | POA: Diagnosis not present

## 2024-09-20 DIAGNOSIS — F32A Depression, unspecified: Secondary | ICD-10-CM | POA: Diagnosis not present

## 2024-09-20 DIAGNOSIS — F329 Major depressive disorder, single episode, unspecified: Secondary | ICD-10-CM | POA: Diagnosis not present

## 2024-09-26 DIAGNOSIS — F329 Major depressive disorder, single episode, unspecified: Secondary | ICD-10-CM | POA: Diagnosis not present

## 2024-09-27 DIAGNOSIS — F329 Major depressive disorder, single episode, unspecified: Secondary | ICD-10-CM | POA: Diagnosis not present

## 2024-10-03 DIAGNOSIS — F329 Major depressive disorder, single episode, unspecified: Secondary | ICD-10-CM | POA: Diagnosis not present

## 2024-10-04 DIAGNOSIS — F329 Major depressive disorder, single episode, unspecified: Secondary | ICD-10-CM | POA: Diagnosis not present

## 2024-10-07 DIAGNOSIS — F329 Major depressive disorder, single episode, unspecified: Secondary | ICD-10-CM | POA: Diagnosis not present

## 2024-10-10 DIAGNOSIS — F329 Major depressive disorder, single episode, unspecified: Secondary | ICD-10-CM | POA: Diagnosis not present

## 2024-10-12 DIAGNOSIS — F329 Major depressive disorder, single episode, unspecified: Secondary | ICD-10-CM | POA: Diagnosis not present
